# Patient Record
Sex: Female | Born: 1970 | Race: Black or African American | Hispanic: No | Marital: Married | Smoking: Former smoker
Health system: Southern US, Community
[De-identification: ages and names within clinical notes are randomized; demographics above are authoritative.]

## PROBLEM LIST (undated history)

## (undated) DIAGNOSIS — E785 Hyperlipidemia, unspecified: Secondary | ICD-10-CM

## (undated) DIAGNOSIS — I10 Essential (primary) hypertension: Secondary | ICD-10-CM

## (undated) DIAGNOSIS — I2699 Other pulmonary embolism without acute cor pulmonale: Secondary | ICD-10-CM

## (undated) DIAGNOSIS — E119 Type 2 diabetes mellitus without complications: Secondary | ICD-10-CM

## (undated) HISTORY — DX: Type 2 diabetes mellitus without complications: E11.9

## (undated) HISTORY — PX: OTHER SURGICAL HISTORY: SHX169

## (undated) HISTORY — DX: Hyperlipidemia, unspecified: E78.5

## (undated) HISTORY — DX: Other pulmonary embolism without acute cor pulmonale: I26.99

---

## 2001-02-14 ENCOUNTER — Ambulatory Visit (HOSPITAL_COMMUNITY): Admission: RE | Admit: 2001-02-14 | Discharge: 2001-02-14 | Payer: Self-pay | Admitting: *Deleted

## 2001-02-14 ENCOUNTER — Encounter: Payer: Self-pay | Admitting: *Deleted

## 2001-06-21 ENCOUNTER — Encounter: Payer: Self-pay | Admitting: *Deleted

## 2001-06-21 ENCOUNTER — Ambulatory Visit (HOSPITAL_COMMUNITY): Admission: RE | Admit: 2001-06-21 | Discharge: 2001-06-21 | Payer: Self-pay | Admitting: *Deleted

## 2001-07-01 ENCOUNTER — Ambulatory Visit (HOSPITAL_COMMUNITY): Admission: AD | Admit: 2001-07-01 | Discharge: 2001-07-01 | Payer: Self-pay | Admitting: *Deleted

## 2001-07-18 ENCOUNTER — Inpatient Hospital Stay (HOSPITAL_COMMUNITY): Admission: RE | Admit: 2001-07-18 | Discharge: 2001-07-21 | Payer: Self-pay | Admitting: *Deleted

## 2001-07-28 ENCOUNTER — Emergency Department (HOSPITAL_COMMUNITY): Admission: EM | Admit: 2001-07-28 | Discharge: 2001-07-28 | Payer: Self-pay | Admitting: Emergency Medicine

## 2001-07-28 ENCOUNTER — Encounter: Payer: Self-pay | Admitting: Emergency Medicine

## 2004-10-05 ENCOUNTER — Ambulatory Visit (HOSPITAL_COMMUNITY): Admission: RE | Admit: 2004-10-05 | Discharge: 2004-10-05 | Payer: Self-pay

## 2004-12-26 ENCOUNTER — Ambulatory Visit (HOSPITAL_COMMUNITY): Admission: RE | Admit: 2004-12-26 | Discharge: 2004-12-26 | Payer: Self-pay | Admitting: Obstetrics and Gynecology

## 2005-08-19 IMAGING — US US BREAST*R*
1 series · 4 of 4 positions shown · non-contrast
Comparison: none

BILATERAL  DIAGNOSTIC MAMMOGRAM AND RIGHT BREAST ULTRASOUND:
CLINICAL DATA: 33-year-old with nodule seen in the right axilla on recent MRI performed at [facility] 
for evaluation of the liver.  Review is performed of the MRI performed in June 2004, showing 
small lower axillary lymph nodes on the right.

[Series 1: unknown · 0.07mm/px · 4 of 4 slices shown]
[im 1/4]
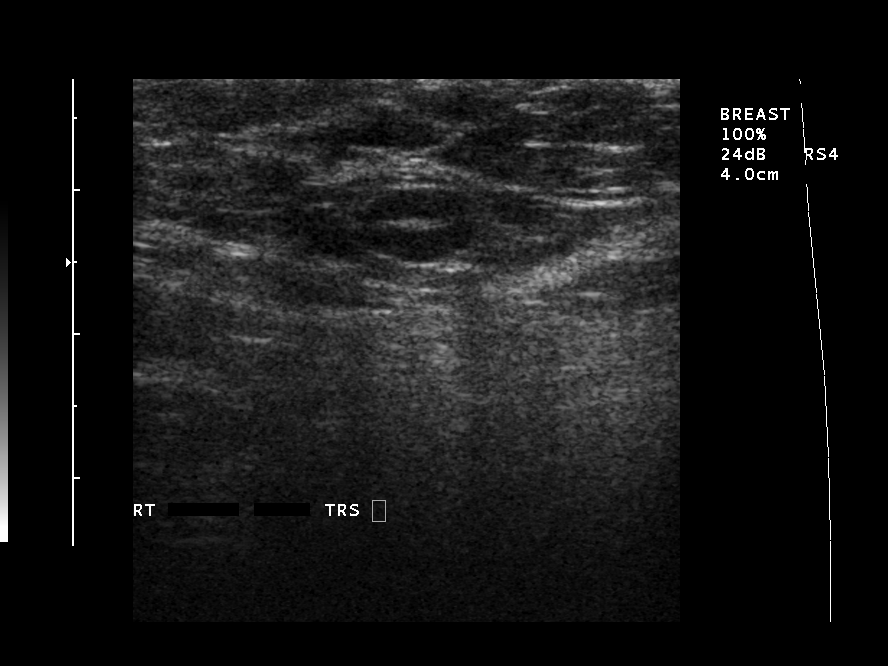
[im 2/4]
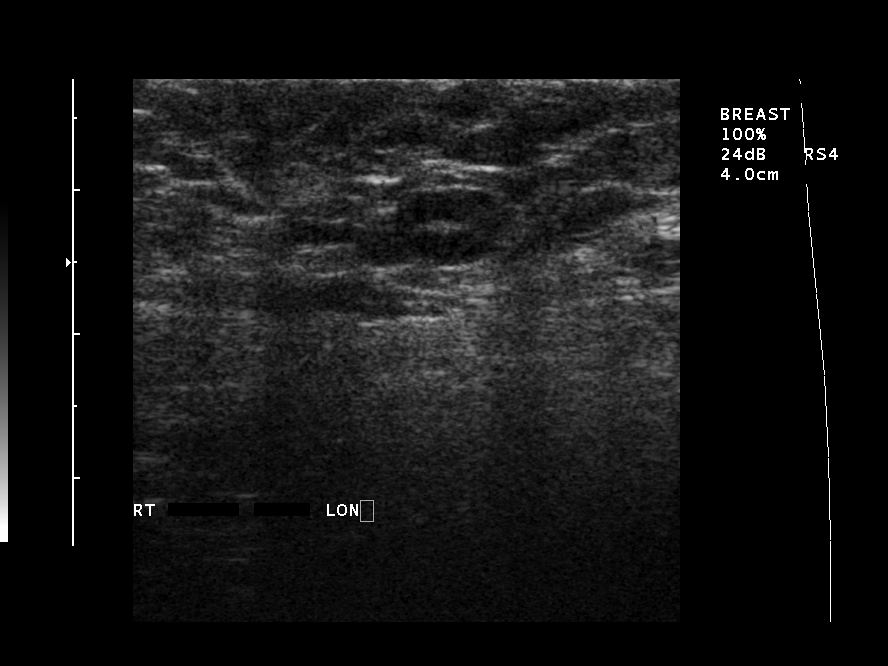
[im 3/4]
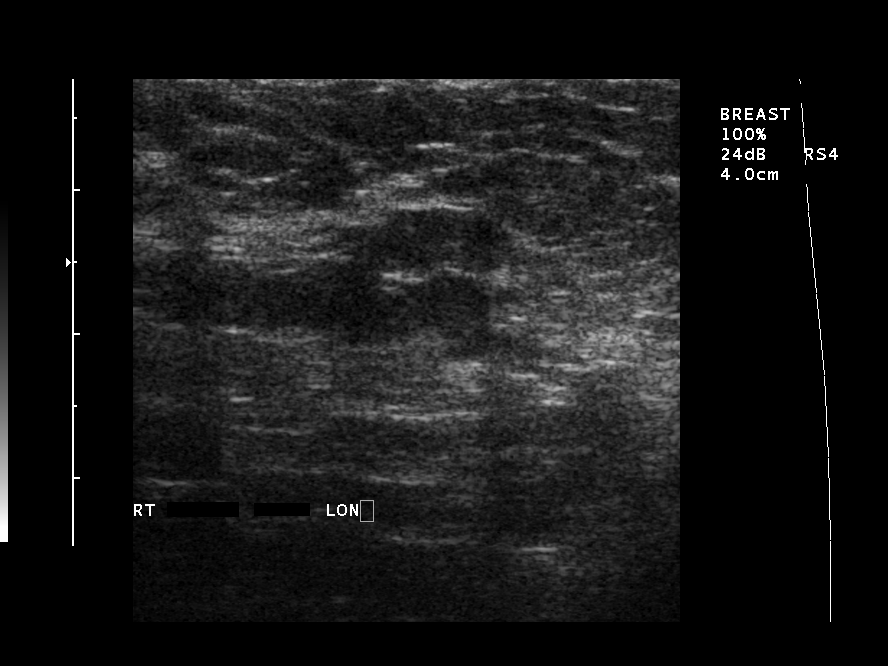
[im 4/4]
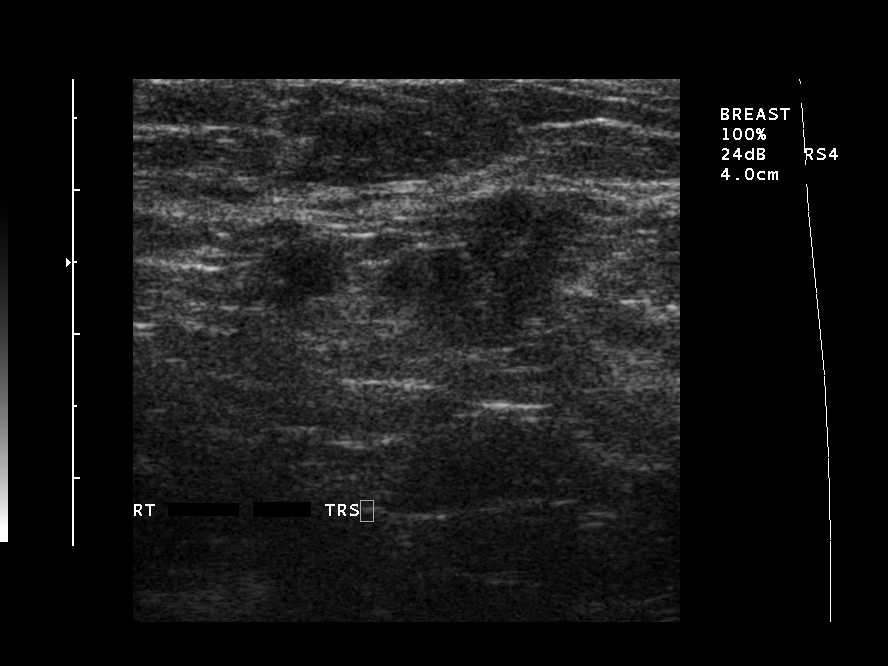

[4 of 4 positions shown; findings below may reference images not displayed]

Baseline mammogram shows fibroglandular parenchyma bilaterally.  No mass, architectural distortion 
or suspicious microcalcifications are seen to suggest the presence of malignancy.  Benign-appearing
lymph nodes are seen in the axillae bilaterally.

On physical exam, I am unable to palpate an abnormality in the lower portion of the right axilla.  
Ultrasound is performed of the entire right axilla, showing normal-appearing lymph nodes.  No 
enlarged nodes or other masses identified.
IMPRESSION: No evidence for malignancy.  Annual mammography is recommended beginning at age 40.

ASSESSMENT: Negative - BI-RADS 1

Routine screening mammogram at age 38.

## 2017-02-07 ENCOUNTER — Encounter (HOSPITAL_COMMUNITY): Payer: 59

## 2017-02-07 ENCOUNTER — Encounter (HOSPITAL_COMMUNITY): Payer: 59 | Attending: Oncology | Admitting: Oncology

## 2017-02-07 ENCOUNTER — Encounter (HOSPITAL_COMMUNITY): Payer: Self-pay

## 2017-02-07 DIAGNOSIS — E785 Hyperlipidemia, unspecified: Secondary | ICD-10-CM | POA: Diagnosis not present

## 2017-02-07 DIAGNOSIS — E119 Type 2 diabetes mellitus without complications: Secondary | ICD-10-CM | POA: Diagnosis not present

## 2017-02-07 DIAGNOSIS — Z87891 Personal history of nicotine dependence: Secondary | ICD-10-CM | POA: Insufficient documentation

## 2017-02-07 DIAGNOSIS — Z794 Long term (current) use of insulin: Secondary | ICD-10-CM | POA: Diagnosis not present

## 2017-02-07 DIAGNOSIS — D72829 Elevated white blood cell count, unspecified: Secondary | ICD-10-CM | POA: Insufficient documentation

## 2017-02-07 DIAGNOSIS — Z79899 Other long term (current) drug therapy: Secondary | ICD-10-CM | POA: Insufficient documentation

## 2017-02-07 LAB — COMPREHENSIVE METABOLIC PANEL
ALT: 16 U/L (ref 14–54)
AST: 19 U/L (ref 15–41)
Albumin: 4.3 g/dL (ref 3.5–5.0)
Alkaline Phosphatase: 100 U/L (ref 38–126)
Anion gap: 9 (ref 5–15)
BUN: 11 mg/dL (ref 6–20)
CHLORIDE: 105 mmol/L (ref 101–111)
CO2: 24 mmol/L (ref 22–32)
Calcium: 9.5 mg/dL (ref 8.9–10.3)
Creatinine, Ser: 0.6 mg/dL (ref 0.44–1.00)
Glucose, Bld: 156 mg/dL — ABNORMAL HIGH (ref 65–99)
POTASSIUM: 3.8 mmol/L (ref 3.5–5.1)
Sodium: 138 mmol/L (ref 135–145)
TOTAL PROTEIN: 7.9 g/dL (ref 6.5–8.1)
Total Bilirubin: 0.8 mg/dL (ref 0.3–1.2)

## 2017-02-07 LAB — CBC WITH DIFFERENTIAL/PLATELET
BASOS ABS: 0.1 10*3/uL (ref 0.0–0.1)
Basophils Relative: 0 %
EOS PCT: 2 %
Eosinophils Absolute: 0.3 10*3/uL (ref 0.0–0.7)
HCT: 45.3 % (ref 36.0–46.0)
Hemoglobin: 15.2 g/dL — ABNORMAL HIGH (ref 12.0–15.0)
LYMPHS ABS: 4.8 10*3/uL — AB (ref 0.7–4.0)
LYMPHS PCT: 27 %
MCH: 29.1 pg (ref 26.0–34.0)
MCHC: 33.6 g/dL (ref 30.0–36.0)
MCV: 86.8 fL (ref 78.0–100.0)
MONO ABS: 1.4 10*3/uL — AB (ref 0.1–1.0)
Monocytes Relative: 8 %
Neutro Abs: 11.2 10*3/uL — ABNORMAL HIGH (ref 1.7–7.7)
Neutrophils Relative %: 63 %
PLATELETS: 222 10*3/uL (ref 150–400)
RBC: 5.22 MIL/uL — ABNORMAL HIGH (ref 3.87–5.11)
RDW: 14.5 % (ref 11.5–15.5)
WBC: 17.8 10*3/uL — ABNORMAL HIGH (ref 4.0–10.5)

## 2017-02-07 NOTE — Progress Notes (Signed)
[facility] Initial Visit:  Patient Care Team: Renee Rival, NP as PCP - General (Nurse Practitioner)  CHIEF COMPLAINTS/PURPOSE OF CONSULTATION:  Leukocytosis  HISTORY OF PRESENTING ILLNESS: Lindsey Vazquez 46 y.o. female is here because of  persistent leukocytosis. Patient recently had a CBC performed on 12/01/16 which demonstrated WBC 14.6 K, hemoglobin 14.9 g/dL, hematocrit 45.5%, MCV 88, platelets 290K, differential demonstrated an absolute neutrophil count of 8.5K, absolute lymphocytes 4.7K, absolute monocytes 1K. The received from 11/13/16 was 13.8K. WBC from 08/18/16 was 13.4K. Patient denies any chronic infections, however she does state that she has bad sinus infections occasionally. She used to be a smoker however states that she quit cigarettes a year ago and currently smokes cigars maybe once a week. She has fatigue. Denies any chest pain, shortness of breath, abdominal pain.  Review of Systems  Constitutional: Positive for fatigue. Negative for appetite change, chills and fever.  HENT:   Negative for hearing loss, lump/mass, mouth sores, sore throat and tinnitus.   Eyes: Negative for eye problems and icterus.  Respiratory: Negative for chest tightness, cough, hemoptysis, shortness of breath and wheezing.   Cardiovascular: Negative for chest pain, leg swelling and palpitations.  Gastrointestinal: Negative for abdominal distention, abdominal pain, blood in stool, diarrhea, nausea and vomiting.  Endocrine: Negative.  Negative for hot flashes.  Genitourinary: Negative for difficulty urinating, frequency and hematuria.   Musculoskeletal: Negative for arthralgias and neck pain.  Skin: Negative for itching and rash.  Neurological: Negative for dizziness, headaches and speech difficulty.  Hematological: Negative for adenopathy. Does not bruise/bleed easily.  Psychiatric/Behavioral: Negative for confusion. The patient is not nervous/anxious.     MEDICAL  HISTORY: Past Medical History:  Diagnosis Date  . Diabetes mellitus without complication ([place])   . Hyperlipemia     SURGICAL HISTORY: Past Surgical History:  Procedure Laterality Date  . c-section      SOCIAL HISTORY: Social History   Social History  . Marital status: Single    Spouse name: N/A  . Number of children: N/A  . Years of education: N/A   Occupational History  . Not on file.   Social History Main Topics  . Smoking status: Not on file  . Smokeless tobacco: Not on file  . Alcohol use Not on file  . Drug use: Unknown  . Sexual activity: Not on file   Other Topics Concern  . Not on file   Social History Narrative  . No narrative on file    FAMILY HISTORY No family history on file.  ALLERGIES:  has No Known Allergies.  MEDICATIONS:  Current Outpatient Prescriptions  Medication Sig Dispense Refill  . atorvastatin (LIPITOR) 10 MG tablet Take 10 mg by mouth daily.    . cholecalciferol (VITAMIN D) 1000 units tablet Take 1,000 Units by mouth once a week.    Marland Kitchen ibuprofen (ADVIL,MOTRIN) 800 MG tablet Take 800 mg by mouth every 8 (eight) hours as needed.    . insulin aspart (NOVOLOG) 100 UNIT/ML injection Inject into the skin 3 (three) times daily before meals.    . insulin glargine (LANTUS) 100 UNIT/ML injection Inject 40 Units into the skin at bedtime.    . metFORMIN (GLUCOPHAGE) 500 MG tablet Take by mouth 2 (two) times daily with a meal.    . traZODone (DESYREL) 50 MG tablet Take 50 mg by mouth at bedtime.     No current facility-administered medications for this visit.     PHYSICAL EXAMINATION:  Vitals:   02/07/17 1305  BP: 130/78  Pulse: (!) 102  Resp: 16  Temp: 98.1 F (36.7 C)    Filed Weights   02/07/17 1305  Weight: 223 lb 4.8 oz (101.3 kg)     Physical Exam  Constitutional: She is oriented to person, place, and time and well-developed, well-nourished, and in no distress. No distress.  HENT:  Head: Normocephalic and atraumatic.   Mouth/Throat: No oropharyngeal exudate.  Eyes: Pupils are equal, round, and reactive to light. Conjunctivae are normal. No scleral icterus.  Neck: Normal range of motion. Neck supple. No JVD present.  Cardiovascular: Normal rate, regular rhythm and normal heart sounds.  Exam reveals no gallop and no friction rub.   No murmur heard. Pulmonary/Chest: Breath sounds normal. No respiratory distress. She has no wheezes. She has no rales.  Abdominal: Soft. Bowel sounds are normal. She exhibits no distension. There is no tenderness. There is no guarding.  Musculoskeletal: She exhibits no edema or tenderness.  Lymphadenopathy:    She has no cervical adenopathy.  Neurological: She is alert and oriented to person, place, and time. No cranial nerve deficit.  Skin: Skin is warm and dry. No rash noted. No erythema. No pallor.  Psychiatric: Affect and judgment normal.     LABORATORY DATA: I have personally reviewed the data as listed:  No visits with results within 1 Month(s) from this visit.  Latest known visit with results is:  No results found for any previous visit.    RADIOGRAPHIC STUDIES: I have personally reviewed the radiological images as listed and agree with the findings in the report  No results found.  ASSESSMENT: Persistent mild leukocytosis  PLAN: I will perform a leukocytosis workup with labs as stated below to rule out CML and myeloproliferative disorders.  RTC in 2 weeks to discuss labwork and the next plan of care.    Orders Placed This Encounter  Procedures  . CBC with Differential    Standing Status:   Future    Standing Expiration Date:   02/07/2018  . Comprehensive metabolic panel    Standing Status:   Future    Standing Expiration Date:   02/07/2018  . JAK2 V617F, w Reflex to CALR/E12/MPL    Standing Status:   Future    Standing Expiration Date:   02/07/2018  . BCR-ABL1, CML/ALL, PCR, QUANT    Standing Status:   Future    Standing Expiration Date:    02/07/2018    All questions were answered. The patient knows to call the clinic with any problems, questions or concerns.  This note was electronically signed.    Twana First, MD  02/07/2017 1:34 PM

## 2017-02-20 LAB — BCR-ABL1, CML/ALL, PCR, QUANT

## 2017-02-22 ENCOUNTER — Other Ambulatory Visit (HOSPITAL_COMMUNITY): Payer: Self-pay

## 2017-02-27 ENCOUNTER — Ambulatory Visit (HOSPITAL_COMMUNITY): Payer: Self-pay

## 2017-03-12 LAB — CALR + JAK2 E12-15 + MPL (REFLEXED)

## 2017-03-12 LAB — JAK2 V617F, W REFLEX TO CALR/E12/MPL

## 2017-03-14 ENCOUNTER — Encounter (HOSPITAL_COMMUNITY): Payer: 59 | Attending: Oncology | Admitting: Oncology

## 2017-03-14 ENCOUNTER — Encounter (HOSPITAL_COMMUNITY): Payer: Self-pay

## 2017-03-14 VITALS — BP 131/75 | HR 110 | Resp 16 | Wt 228.0 lb

## 2017-03-14 DIAGNOSIS — D72829 Elevated white blood cell count, unspecified: Secondary | ICD-10-CM

## 2017-03-14 DIAGNOSIS — Z79899 Other long term (current) drug therapy: Secondary | ICD-10-CM | POA: Insufficient documentation

## 2017-03-14 DIAGNOSIS — E119 Type 2 diabetes mellitus without complications: Secondary | ICD-10-CM | POA: Insufficient documentation

## 2017-03-14 DIAGNOSIS — E785 Hyperlipidemia, unspecified: Secondary | ICD-10-CM | POA: Insufficient documentation

## 2017-03-14 DIAGNOSIS — Z794 Long term (current) use of insulin: Secondary | ICD-10-CM | POA: Insufficient documentation

## 2017-03-14 DIAGNOSIS — Z87891 Personal history of nicotine dependence: Secondary | ICD-10-CM | POA: Insufficient documentation

## 2017-03-14 NOTE — Progress Notes (Signed)
[facility] Initial Visit:  Patient Care Team: Renee Rival, NP as PCP - General (Nurse Practitioner)  CHIEF COMPLAINTS/PURPOSE OF CONSULTATION:  Leukocytosis  HISTORY OF PRESENTING ILLNESS: Lindsey Vazquez 46 y.o. female is here because of  persistent leukocytosis. Patient recently had a CBC performed on 12/01/16 which demonstrated WBC 14.6 K, hemoglobin 14.9 g/dL, hematocrit 45.5%, MCV 88, platelets 290K, differential demonstrated an absolute neutrophil count of 8.5K, absolute lymphocytes 4.7K, absolute monocytes 1K. The received from 11/13/16 was 13.8K. WBC from 08/18/16 was 13.4K. Patient denies any chronic infections, however she does state that she has bad sinus infections occasionally. She used to be a smoker however states that she quit cigarettes a year ago and currently smokes cigars maybe once a week. She has fatigue. Denies any chest pain, shortness of breath, abdominal pain.  INTERVAL HISTORY: Patient presents today for review of her labs. 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 She denies any new complaints since the last visit. She continues to feel tired.  Review of Systems  Constitutional: Positive for fatigue. Negative for appetite change, chills and fever.  HENT:   Negative for hearing loss, lump/mass, mouth sores, sore throat and tinnitus.   Eyes: Negative for eye problems and icterus.  Respiratory: Negative for chest tightness, cough, hemoptysis, shortness of breath and wheezing.   Cardiovascular: Negative for chest pain, leg swelling and palpitations.  Gastrointestinal: Negative for abdominal distention, abdominal pain, blood in stool, diarrhea, nausea and vomiting.  Endocrine: Negative.  Negative for hot flashes.  Genitourinary: Negative for difficulty urinating, frequency and hematuria.   Musculoskeletal: Negative for arthralgias and neck pain.  Skin: Negative for itching and rash.   Neurological: Negative for dizziness, headaches and speech difficulty.  Hematological: Negative for adenopathy. Does not bruise/bleed easily.  Psychiatric/Behavioral: Negative for confusion. The patient is not nervous/anxious.     MEDICAL HISTORY: Past Medical History:  Diagnosis Date  . Diabetes mellitus without complication ([place])   . Hyperlipemia     SURGICAL HISTORY: Past Surgical History:  Procedure Laterality Date  . c-section      SOCIAL HISTORY: Social History   Social History  . Marital status: Single    Spouse name: N/A  . Number of children: N/A  . Years of education: N/A   Occupational History  . Not on file.   Social History Main Topics  . Smoking status: Former Smoker    Quit date: 07/25/2015  . Smokeless tobacco: Never Used  . Alcohol use No     Comment: socially  . Drug use: No  . Sexual activity: Yes   Other Topics Concern  . Not on file   Social History Narrative  . No narrative on file    FAMILY HISTORY History reviewed. No pertinent family history.  ALLERGIES:  has No Known Allergies.  MEDICATIONS:  Current Outpatient Prescriptions  Medication Sig Dispense Refill  . atorvastatin (LIPITOR) 10 MG tablet Take 10 mg by mouth daily.    . cholecalciferol (VITAMIN D) 1000 units tablet Take 1,000 Units by mouth once a week.    . Dapagliflozin Propanediol (FARXIGA PO) Take 1 tablet by mouth daily.    Marland Kitchen ibuprofen (ADVIL,MOTRIN) 800 MG tablet Take 800 mg by mouth every 8 (eight) hours as needed.    . insulin aspart (NOVOLOG) 100 UNIT/ML injection Inject into the skin 3 (three) times daily before meals.    . insulin glargine (LANTUS) 100 UNIT/ML injection  Inject 40 Units into the skin at bedtime.    . metFORMIN (GLUCOPHAGE) 500 MG tablet Take by mouth 2 (two) times daily with a meal.    . traZODone (DESYREL) 50 MG tablet Take 50 mg by mouth at bedtime.     No current facility-administered medications for this visit.     PHYSICAL  EXAMINATION:   Vitals:   03/14/17 1502  BP: 131/75  Pulse: (!) 110  Resp: 16  SpO2: 99%    Filed Weights   03/14/17 1502  Weight: 228 lb (103.4 kg)     Physical Exam  Constitutional: She is oriented to person, place, and time and well-developed, well-nourished, and in no distress. No distress.  HENT:  Head: Normocephalic and atraumatic.  Mouth/Throat: No oropharyngeal exudate.  Eyes: Pupils are equal, round, and reactive to light. Conjunctivae are normal. No scleral icterus.  Neck: Normal range of motion. Neck supple. No JVD present.  Cardiovascular: Normal rate, regular rhythm and normal heart sounds.  Exam reveals no gallop and no friction rub.   No murmur heard. Pulmonary/Chest: Breath sounds normal. No respiratory distress. She has no wheezes. She has no rales.  Abdominal: Soft. Bowel sounds are normal. She exhibits no distension. There is no tenderness. There is no guarding.  Musculoskeletal: She exhibits no edema or tenderness.  Lymphadenopathy:    She has no cervical adenopathy.  Neurological: She is alert and oriented to person, place, and time. No cranial nerve deficit.  Skin: Skin is warm and dry. No rash noted. No erythema. No pallor.  Psychiatric: Affect and judgment normal.     LABORATORY DATA: I have personally reviewed the data as listed:  No visits with results within 1 Month(s) from this visit.  Latest known visit with results is:  Lab on 02/07/2017  Component Date Value Ref Range Status  . WBC 02/07/2017 17.8* 4.0 - 10.5 K/uL Final  . RBC 02/07/2017 5.22* 3.87 - 5.11 MIL/uL Final  . Hemoglobin 02/07/2017 15.2* 12.0 - 15.0 g/dL Final  . HCT 02/07/2017 45.3  36.0 - 46.0 % Final  . MCV 02/07/2017 86.8  78.0 - 100.0 fL Final  . MCH 02/07/2017 29.1  26.0 - 34.0 pg Final  . MCHC 02/07/2017 33.6  30.0 - 36.0 g/dL Final  . RDW 02/07/2017 14.5  11.5 - 15.5 % Final  . Platelets 02/07/2017 222  150 - 400 K/uL Final  . Neutrophils Relative % 02/07/2017 63  %  Final  . Neutro Abs 02/07/2017 11.2* 1.7 - 7.7 K/uL Final  . Lymphocytes Relative 02/07/2017 27  % Final  . Lymphs Abs 02/07/2017 4.8* 0.7 - 4.0 K/uL Final  . Monocytes Relative 02/07/2017 8  % Final  . Monocytes Absolute 02/07/2017 1.4* 0.1 - 1.0 K/uL Final  . Eosinophils Relative 02/07/2017 2  % Final  . Eosinophils Absolute 02/07/2017 0.3  0.0 - 0.7 K/uL Final  . Basophils Relative 02/07/2017 0  % Final  . Basophils Absolute 02/07/2017 0.1  0.0 - 0.1 K/uL Final  . Sodium 02/07/2017 138  135 - 145 mmol/L Final  . Potassium 02/07/2017 3.8  3.5 - 5.1 mmol/L Final  . Chloride 02/07/2017 105  101 - 111 mmol/L Final  . CO2 02/07/2017 24  22 - 32 mmol/L Final  . Glucose, Bld 02/07/2017 156* 65 - 99 mg/dL Final  . BUN 02/07/2017 11  6 - 20 mg/dL Final  . Creatinine, Ser 02/07/2017 0.60  0.44 - 1.00 mg/dL Final  . Calcium 02/07/2017 9.5  8.9 -  10.3 mg/dL Final  . Total Protein 02/07/2017 7.9  6.5 - 8.1 g/dL Final  . Albumin 02/07/2017 4.3  3.5 - 5.0 g/dL Final  . AST 02/07/2017 19  15 - 41 U/L Final  . ALT 02/07/2017 16  14 - 54 U/L Final  . Alkaline Phosphatase 02/07/2017 100  38 - 126 U/L Final  . Total Bilirubin 02/07/2017 0.8  0.3 - 1.2 mg/dL Final  . GFR calc non Af Amer 02/07/2017 >60  >60 mL/min Final  . GFR calc Af Amer 02/07/2017 >60  >60 mL/min Final   Comment: (NOTE) The eGFR has been calculated using the CKD EPI equation. This calculation has not been validated in all clinical situations. eGFR's persistently <60 mL/min signify possible Chronic Kidney Disease.   . Anion gap 02/07/2017 9  5 - 15 Final  . JAK2 GenotypR 02/07/2017 Comment   Final   Comment: (NOTE) Result: NEGATIVE for the JAK2 V617F mutation. Interpretation:  The G to T nucleotide change encoding the V617F mutation was not detected.  This result does not rule out the presence of the JAK2 mutation at a level below the sensitivity of detection of this assay, or the presence of other mutations within JAK2 not  detected by this assay.  This result does not rule out a diagnosis of polycythemia vera, essential thrombocythemia or idiopathic myelofibrosis as the V617F mutation is not detected in all patients with these disorders.   Marland Kitchen BACKGROUND: 02/07/2017 Comment   Final   Comment: (NOTE) JAK2 is a cytoplasmic tyrosine kinase with a key role in signal transduction from multiple hematopoietic growth factor receptors. A point mutation within exon 14 of the JAK2 gene ([id number]) encoding a valine to phenylalanine substitution at position 617 of the JAK2 protein (V617F) has been identified in most patients with polycythemia vera, and in about half of those with either essential thrombocythemia or idiopathic myelofibrosis. The V617F has also been detected, although infrequently, in other myeloid disorders such as chronic myelomonocytic leukemia and chronic neutrophilic luekemia. V617F is an acquired mutation that alters a highly conserved valine present in the negative regulatory JH2 domain of the JAK2 protein and is predicted to dysregulate kinase activity. Methodology: Total genomic DNA was extracted and subjected to TaqMan real-time PCR amplification/detection. Two amplification products per sample were monitored by real-time PCR using primers/probes s                          pecific to JAK2 wild type (WT) and JAK2 mutant V617F. The ABI7900 Absolute Quantitation software will compare the patient specimen valuse to the standard curves and generate percent values for wild type and mutant type. In vitro studies have indicated that this assay has an analytical sensitivity of 1%. References: Baxter EJ, Scott Phineas Real, et al. Acquired mutation of the tyrosine kinase JAK2 in human myeloproliferative disorders. Lancet. 2005 Mar 19-25; 365(9464):1054-1061. Alfonso Ramus Couedic JP. A unique clonal JAK2 mutation leading to constitutive signaling causes polycythaemia vera. Nature. 2005 Apr 28;  434(7037):1144-1148. Kralovics R, Passamonti F, Buser AS, et al. A gain-of-function mutation of JAK2 in myeloproliferative disorders. N Engl J Med. 2005 Apr 28; 352(17):1779-1790.   . Director Review, JAK2 02/07/2017 Comment   Final   Comment: (NOTE) Katina Degree, MD, PhD Director, [facility] [place], [place] [address] [phone]   . REFLEX: 02/07/2017 Comment   Final   Comment: (NOTE) Reflex to CALR Mutation  Analysis, JAK2 Exon 12-15 Mutation Analysis, and MPL Mutation Analysis is indicated.   Marland Kitchen Extraction 02/07/2017 Completed   Corrected   Comment: (NOTE) Performed At: [facility] [address] [place], [place] [id number] Nechama Guard MD CH:[id number] Performed At: [facility] [address] [place], [place] [id number] Nechama Guard MD QM:[id number]   . b2a2 transcript 02/07/2017 Comment  % Final   Comment: (NOTE)           <0.0032 % (sensitivity limit of assay)   . b3a2 transcript 02/07/2017 Comment  % Final   Comment: (NOTE)           <0.0032 % (sensitivity limit of assay)   . E1A2 Transcript 02/07/2017 Comment  % Final   Comment: (NOTE)           <0.0032 % (sensitivity limit of assay)   . Interpretation (BCRAL): 02/07/2017 Comment   Final   Comment: (NOTE) NEGATIVE for the BCR-ABL1 e1a2 (p190), e13a2 (b2a2, p210) and e14a2 (b3a2, p210) fusion transcripts. These results do not rule out the presence of rare BCR-ABL1 transcripts not detected by this assay.   . Director Review (BCRAL): 02/07/2017 Comment   Final   Comment: (NOTE) Katina Degree, MD, PhD Director, [facility] [place], [place] [address] [phone]   . Background: 02/07/2017 Comment   Corrected   Comment: (NOTE) This assay can detect three different types of BCR-ABL1 fusion transcripts associated with CML, ALL, and AML: e13a2  (previously b2a2) and e14a2 (previously b3a2) (major breakpoint, p210), as well as e1a2 (minor breakpoint, p190). The e13a2 and e14a2 transcript values are titrated to the current International Scale (IS). The standardized baseline is 100% BCR-ABL1 (IS) and major molecular response (MMR) is equivalent to 0.1% BCR-ABL1 (IS) corresponding to a 3-log reduction. Results should be correlated with appropriate clinical and laboratory information as indicated.   . Methodology 02/07/2017 Comment   Corrected   Comment: (NOTE) Total RNA is isolated from the sample and subject to a real-time, reverse transcriptase polymerase chain reaction (RT-PCR). The PCR primers and probes are specific for BCR-ABL1 e13a2, e14a2 and e1a2 fusion transcripts. The ABL1 transcript is amplified as the control for cDNA quantity and quality. Serial dilutions of a validated positive control RNA with known t(9;22) BCR-ABL1 are used as reference for quantification of BCR-ABL1 relative to ABL1. The numeric BCR-ABL1 level is reportd as % BCR-ABL1/ABL1 and the detection sensitivity is 4.5 log below the standard baseline. This test was developed and its performance characteristics determined by [facility]. It has not been cleared or approved by the Food and Drug Administration. References:    1. Anastasia Fiedler and Branford S: Seminars in Hematology 2003;       40 (suppl2):62-68.    2. White HE, et al. Blood 2010; 116: e111-117.    3. NCCN Clinical Practice Guidelines in Oncology, Chronic       Myeloid Leukemia. V2. 2017.                           Performed At: [facility] [address] [place], [place] [id number] Nechama Guard MD HA:[id number] Performed At: [facility] [place], [place] [id number] Nechama Guard MD QA:[id number]   . CALR Mutation Detection Result 02/07/2017 Comment   Final   Comment: (NOTE) POSITIVE A deletion mutation was detected within the analyzed region of the  calreticulin (CALR) gene.  A positive result indicates the presence of a clonal population carrying CALR gene mutations. Results should be interpreted in conjunction with clinical and laboratory findings for the most accurate interpretation.   . Background: 02/07/2017 Comment   Final   Comment: (NOTE) The calcium-binding endoplasmic reticulin chaperone protein, calreticulin (CALR), is somatically mutated in approximately 70% of patients with JAK2-negative essential thrombocythemia (ET) and 60- 88% of patients with JAK2-negative primary myelofibrosis(PMF). Only a minority of patients (approximately 8%) with myelodysplasia have mutations in  CALR gene. CALR mutations are rarely detected in patients with de novo acute myeloid leukemia, chronic myelogenous leukemia, lymphoid leukemia, or solid tumors. CALR mutations are not detected in polycythemia and generally appear to be mutually exclusive with JAK2 mutations and MPL mutations. The majority of mutational changes involve a variety of insertion or deletion mutations in exon 9 of the calreticulin gene: approximately 53% of all CALR mutations are a 52 bp deletion (type-1) while the second most prevalent mutation (approximately 32%) contains a 5 bp insertion (type-2). Other mutations (non-type 1 or type 2) are seen                           in a small minority of cases. CALR mutations in PMF tend to be associated with a favorable prognosis compared to JAK2 V617F mutations, whereas primary myelofibrosis negative for CALR, JAK2 V617F and MPL mutations (so-called triple negative) is associated with a poor prognosis and shorter survival. The detection of a CALR gene mutation aids in the specific diagnosis of a myeloproliferative neoplasm, and help distinguish this clonal disease from a benign reactive process.   . Methodology: 02/07/2017 Comment   Final   Comment: (NOTE) Genomic DNA was isolated from the provided specimen. Polymerase  chain reaction (PCR) of exon 9 of the CALR gene was performed with specific fluorescent-labeled primers, and the PCR product was analyzed by capillary gel electrophoresis to determine the size of the PCR products. This PCR assay is capable of detecting a mutant cell population with a sensitivity of 5 mutant cells per 100 normal cells. A negative result does not exclude the presence of a myeloproliferative disorder or other neoplastic process. This test was developed and its performance characteristics determined by [facility]. It has not been cleared or approved by the Food and Drug Administration. The FDA has determined that such clearance or approval is not necessary.   . References: 02/07/2017 Comment   Final   Comment: (NOTE) 1. Klampfel, T. et al. (2013) Somatic mutations of calreticulin in   myeloproliferative neoplasms. [place] Engl. J. Med. 742:5956-3875. 2. Haynes Kerns et al. (2013) Somatic CALR mutations in   myeloproliferative neoplasms with nonmutated JAK2. [place] Engl. J.   Med. [phone].   Marland Kitchen Director Review 02/07/2017 Comment   Final   Comment: (NOTE) Katina Degree, MD, PhD Director, [facility] [place], [place] [address] [phone]   . JAK2 Exons 12-15 Mut Det PCR: 02/07/2017 Comment   Final   Comment: (NOTE) NEGATIVE JAK2 mutations were not detected in exons 12, 13, 14 and 15. This result does not rule out the presence of JAK2 mutation at a level below the detection sensitivity of this assay, the presence of other mutations outside the analyzed region of the JAK2 gene, or the presence of a myeloproliferative or other neoplasm. Result must be correlated with other clinical data for the most accurate diagnosis.   . Indications 02/07/2017 Comment:  Final   NO INDICATION SPECIFIED  . Specimen Type 02/07/2017 Comment   Final   No specimen type provided.  Marland Kitchen BACKGROUND: 02/07/2017 Comment   Final    Comment: (NOTE) JAK2 V617F mutation is detected in patients with polycythemia vera (95%), essential thrombocythemia (50%) and primary myelofibrosis (50%). A small percentage of JAK2 mutation positive patients (3.3%) contain other non-V617F mutations within exons 12 to 15. The detection of a JAK2 gene mutation aids in the specific diagnosis of a myeloproliferative neoplasm, and help distinguish this clonal disease from a benign reactive process.   . Method 02/07/2017 Comment   Final   Comment: (NOTE) Total RNA was purified from the provided specimen. The JAK2 gene region covering exons 12 to 15 was subjected to reverse- transcription coupled PCR amplification, and bi-directional sequencing to identify sequence variations. This assay has a sensitivity to detect approximately 15% population of cells containing the JAK2 mutations in a background of non-mutant cells. This test was developed and its performance characteristics determined by [facility]. It has not been cleared or approved by the Food and Drug Administration.   . References 02/07/2017 Comment   Final   Comment: (NOTE) Algasham, N. et al. Detection of mutations in JAK2 exons 12-15 by Sanger sequencing. Int J Lab Hemato. 2015, 38:34-41. Joelene Millin al. Mutation profile of JAK2 transcripts in patients with chronic myeloproliferative neoplasias. J Mol Diagn. 2009, 11:49-53.   Marland Kitchen DIRECTOR REVIEW: 02/07/2017 Comment   Final   Comment: (NOTE) Loni Muse, PhD  Director, [facility]  Research [place], [place] [address]  [phone]   . MPL MUTATION ANALYSIS RESULT: 02/07/2017 Comment   Final   Comment: (NOTE) No MPL mutation was identified in the provided specimen of this individual. Results should be interpreted in conjunction with clinical and other laboratory findings for the most accurate interpretation.   Marland Kitchen BACKGROUND: 02/07/2017 Comment   Final   Comment: (NOTE) MPL  (myeloproliferative leukemia virus oncogene homology) belongs to the hematopoietin superfamily and enables its ligand thrombopoietin to facilitate both global hematopoiesis and megakaryocyte growth and differentiation. MPL W515 mutations are present in patients with primary myelofibrosis (PMF) and essential thrombocythemia (ET) at a frequency of approximately 5% and 1% respectively. The S505 mutation is detected in patients with hereditary thrombocythemia.   Marland Kitchen METHODOLOGY: 02/07/2017 Comment   Final   Comment: (NOTE) Genomic DNA was purified from the provided specimen. MPL gene region covering the S505N and W515L/K mutations were subjected to PCR amplification and bi-directional sequencing in duplicate to identify sequence variations. This assay has a sensitivity to detect approximately 20-25% population of cells containing the MPL mutations in a background of non-mutant cells. This assay will not detect the mutation below the sensitivity of this assay. Molecular- based testing is highly accurate, but as in any laboratory test, rare diagnostic errors may occur.   Marland Kitchen REFERENCES: 02/07/2017 Comment   Final   Comment: (NOTE) 1. Pardanani AD, et al. (2006). MPL515 mutations in   myeloproliferative and other myeloid disorders: a study   of 1182 patients. Blood 353:6144-3154. 2. Andre Lefort and Levine RL. (2008). JAK2 and MPL   mutations in myeloproliferative neoplasms: discovery and   science. Leukemia 22:1813-1817. 3. Juline Patch, et al. (2009). Evidence for a founder effect   of the MPL-S505N mutation in eight [place] pedigrees with   hereditary thrombocythemia. Haematologica 94(10):1368-   0086.   Marland Kitchen DIRECTOR REVIEW: 02/07/2017 Comment   Final   Comment: (NOTE) Linna Hoff  Mina Marble, PhD  Director, [facility] [place] [facility] [place], [place] [address]  [phone] This test was developed and its performance characteristics determined by  [facility]. It has not been cleared or approved by the Food and Drug Administration. Performed At: [facility] [address] [place], [place] [id number] Nechama Guard MD MP:[id number] Performed At: [facility] [place], [place] [id number] Nechama Guard MD KD:[id number]     RADIOGRAPHIC STUDIES: I have personally reviewed the radiological images as listed and agree with the findings in the report  No results found.  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   PLAN: -I have reviewed her labwork in detail with her today. She has a positive CALR mutation. The calcium-binding endoplasmic reticulin chaperone protein, calreticulin (CALR), is somatically mutated in approximately 70% of patients with JAK2-negative essential thrombocythemia (ET) and 60- 88% of patients with JAK2-negative primary myelofibrosis(PMF). Only a minority of patients (approximately 8%) with myelodysplasia have mutations in CALR gene. 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 -Will continue observation of her counts for now. If her leukocytosis should worsen dramatically, may consider cytoreductive agent with hydrea. Patient is currently asymptomatic. -RTC in 4 months with repeat CBC.   All questions were answered. The patient knows to call the clinic with any problems, questions or concerns.  This note was electronically signed.    Twana First, MD  03/14/2017 3:14 PM

## 2017-03-14 NOTE — Patient Instructions (Signed)
[facility] at [facility] Discharge Instructions  RECOMMENDATIONS MADE BY THE CONSULTANT AND ANY TEST RESULTS WILL BE SENT TO YOUR REFERRING PHYSICIAN.  You were seen today by Dr. Louise Zhou    Thank you for choosing [facility] at [facility] to provide your oncology and hematology care.  To afford each patient quality time with our provider, please arrive at least 15 minutes before your scheduled appointment time.    If you have a lab appointment with the Cancer Center please come in thru the  Main Entrance and check in at the main information desk  You need to re-schedule your appointment should you arrive 10 or more minutes late.  We strive to give you quality time with our providers, and arriving late affects you and other patients whose appointments are after yours.  Also, if you no show three or more times for appointments you may be dismissed from the clinic at the providers discretion.     Again, thank you for choosing [facility].  Our hope is that these requests will decrease the amount of time that you wait before being seen by our physicians.       _____________________________________________________________  Should you have questions after your visit to  Cancer Center, please contact our office at [phone] between the hours of 8:30 a.m. and 4:30 p.m.  Voicemails left after 4:30 p.m. will not be returned until the following business day.  For prescription refill requests, have your pharmacy contact our office.       Resources For Cancer Patients and their Caregivers ? American Cancer Society: Can assist with transportation, wigs, general needs, runs Look Good Feel Better.        1-888-227-6333 ? Cancer Care: Provides financial assistance, online support groups, medication/co-pay assistance.  1-800-813-HOPE (4673) ? [facility] Assists [place] cancer patients and their  families through emotional , educational and financial support.  336-427-4357 ? [place] DSS Where to apply for food stamps, Medicaid and utility assistance. 336-342-1394 ? RCATS: Transportation to medical appointments. 336-347-2287 ? Social Security Administration: May apply for disability if have a Stage IV cancer. 336-342-7796 1-800-772-1213 ? [place] Aging, Disability and Transit Services: Assists with nutrition, care and transit needs. 336-349-2343  Cancer Center Support Programs: [email or web address]@ > Cancer Support Group  2nd Tuesday of the month 1pm-2pm, Journey Room  > Creative Journey  3rd Tuesday of the month 1130am-1pm, Journey Room  > Look Good Feel Better  1st Wednesday of the month 10am-12 noon, Journey Room (Call American Cancer Society to register 1-800-395-5775)    

## 2017-07-10 ENCOUNTER — Other Ambulatory Visit (HOSPITAL_COMMUNITY): Payer: 59

## 2017-07-10 ENCOUNTER — Ambulatory Visit (HOSPITAL_COMMUNITY): Payer: 59

## 2017-09-19 ENCOUNTER — Other Ambulatory Visit (HOSPITAL_COMMUNITY): Payer: Self-pay | Admitting: *Deleted

## 2017-09-19 DIAGNOSIS — D72829 Elevated white blood cell count, unspecified: Secondary | ICD-10-CM

## 2017-09-20 ENCOUNTER — Other Ambulatory Visit: Payer: Self-pay

## 2017-09-20 ENCOUNTER — Encounter (HOSPITAL_COMMUNITY): Payer: Self-pay | Admitting: Adult Health

## 2017-09-20 ENCOUNTER — Inpatient Hospital Stay (HOSPITAL_BASED_OUTPATIENT_CLINIC_OR_DEPARTMENT_OTHER): Payer: 59 | Admitting: Adult Health

## 2017-09-20 ENCOUNTER — Inpatient Hospital Stay (HOSPITAL_COMMUNITY): Payer: 59 | Attending: Oncology

## 2017-09-20 VITALS — BP 115/70 | HR 86 | Temp 98.2°F | Resp 16 | Ht 62.0 in | Wt 219.2 lb

## 2017-09-20 DIAGNOSIS — D471 Chronic myeloproliferative disease: Secondary | ICD-10-CM | POA: Diagnosis present

## 2017-09-20 DIAGNOSIS — D72829 Elevated white blood cell count, unspecified: Secondary | ICD-10-CM

## 2017-09-20 LAB — CBC WITH DIFFERENTIAL/PLATELET
BASOS ABS: 0.1 10*3/uL (ref 0.0–0.1)
Basophils Relative: 0 %
Eosinophils Absolute: 0.3 10*3/uL (ref 0.0–0.7)
Eosinophils Relative: 2 %
HEMATOCRIT: 44.7 % (ref 36.0–46.0)
HEMOGLOBIN: 14.6 g/dL (ref 12.0–15.0)
LYMPHS PCT: 21 %
Lymphs Abs: 3 10*3/uL (ref 0.7–4.0)
MCH: 29.3 pg (ref 26.0–34.0)
MCHC: 32.7 g/dL (ref 30.0–36.0)
MCV: 89.6 fL (ref 78.0–100.0)
Monocytes Absolute: 1.3 10*3/uL — ABNORMAL HIGH (ref 0.1–1.0)
Monocytes Relative: 9 %
NEUTROS ABS: 10 10*3/uL — AB (ref 1.7–7.7)
NEUTROS PCT: 68 %
Platelets: 317 10*3/uL (ref 150–400)
RBC: 4.99 MIL/uL (ref 3.87–5.11)
RDW: 16 % — ABNORMAL HIGH (ref 11.5–15.5)
WBC: 14.6 10*3/uL — ABNORMAL HIGH (ref 4.0–10.5)

## 2017-09-20 NOTE — Progress Notes (Signed)
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 Laverle Patter note on 03/14/17)      INTERVAL HISTORY:  Lindsey Vazquez 47 y.o. female returns for follow-up for leukocytosis.   Here today unaccompanied.  Overall she tells me she has been feeling pretty well.  Appetite 100%; energy level 75%.  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  She is struggling with her diabetes management; was recently started on Jardiance, and has had 3 vaginal yeast infections since that time.  Her diabetes is managed by her PCP. 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   From a hematologic standpoint, she feels well.  Denies any fever or chills.  Denies severe fatigue, fevers, pruritis, or bone pain.Does endorse occasional drenching night sweats, but these have improved in recent months.  She was having night sweats 1-2 times per week, but now they are not as often. She thinks she may be going through menopause and having hot flashes. She missed several months of her menstrual cycle, but it recently started again. She tells me she currently has her menstrual period.     Denies any unintentional weight loss that she is aware of. Chart reviewed; weight down ~9 lbs from 02/2017, but only ~4 lbs from 01/2017.  She tells me that she is not necessarily actively trying to lose weight, but she stays very busy and attributes some of her weight loss to this.    REVIEW OF SYSTEMS:  Review of Systems  Constitutional: Positive for diaphoresis and fatigue. Negative for chills and fever.  HENT:  Negative.   Eyes:  Negative.   Respiratory: Negative.   Cardiovascular: Negative.   Gastrointestinal: Negative.   Endocrine: Positive for hot flashes.  Musculoskeletal: Negative.   Skin: Negative.  Negative for itching and rash.  Neurological: Positive for numbness.  Hematological: Negative.   Psychiatric/Behavioral: Negative.      PAST MEDICAL/SURGICAL HISTORY:  Past Medical History:  Diagnosis Date  . Diabetes mellitus without complication ([place])   . Hyperlipemia    Past Surgical History:  Procedure Laterality Date  . c-section       SOCIAL HISTORY:  Social History   Socioeconomic History  . Marital status: Single    Spouse name: Not on file  . Number of children: Not on file  . Years of education: Not on file  . Highest education level: Not on file  Social Needs  . Financial resource strain: Not on file  . Food insecurity - worry: Not on file  . Food insecurity - inability: Not on file  . Transportation needs - medical: Not on file  . Transportation needs - non-medical: Not on file  Occupational History  . Not on file  Tobacco Use  . Smoking status: Former Smoker    Last attempt to quit: 07/25/2015    Years since quitting: 2.1  . Smokeless tobacco: Never Used  Substance and Sexual Activity  . Alcohol use: No    Comment: socially  . Drug use: No  . Sexual activity: Yes  Other Topics Concern  . Not on file  Social History Narrative  . Not on file  FAMILY HISTORY:  History reviewed. No pertinent family history.  CURRENT MEDICATIONS:  Outpatient Encounter Medications as of 09/20/2017  Medication Sig  . atorvastatin (LIPITOR) 10 MG tablet Take 10 mg by mouth daily.  . cholecalciferol (VITAMIN D) 1000 units tablet Take 1,000 Units by mouth once a week.  . Dapagliflozin Propanediol (FARXIGA PO) Take 1 tablet by mouth daily.  . empagliflozin (JARDIANCE) 25 MG TABS tablet Take 25 mg by mouth daily.  Marland Kitchen ibuprofen (ADVIL,MOTRIN) 800 MG tablet Take 800 mg by mouth every 8 (eight)  hours as needed.  . insulin aspart (NOVOLOG) 100 UNIT/ML injection Inject into the skin 3 (three) times daily before meals.  . insulin glargine (LANTUS) 100 UNIT/ML injection Inject 40 Units into the skin at bedtime.  . metFORMIN (GLUCOPHAGE) 500 MG tablet Take by mouth 2 (two) times daily with a meal.  . Multiple Vitamin (MULTIVITAMIN) tablet Take 1 tablet by mouth daily.  . traZODone (DESYREL) 50 MG tablet Take 50 mg by mouth at bedtime.   No facility-administered encounter medications on file as of 09/20/2017.     ALLERGIES:  No Known Allergies   PHYSICAL EXAM:  ECOG Performance status: 0-1 - Mildly symptomatic; remains independent   Vitals:   09/20/17 0919  BP: 115/70  Pulse: 86  Resp: 16  Temp: 98.2 F (36.8 C)  SpO2: 98%   Filed Weights   09/20/17 0919  Weight: 219 lb 3.2 oz (99.4 kg)    Physical Exam  Constitutional: She is oriented to person, place, and time and well-developed, well-nourished, and in no distress.  HENT:  Head: Normocephalic.  Mouth/Throat: Oropharynx is clear and moist. No oropharyngeal exudate.  Eyes: Conjunctivae are normal. Pupils are equal, round, and reactive to light. No scleral icterus.  Neck: Normal range of motion. Neck supple.  Cardiovascular: Normal rate and regular rhythm.  Pulmonary/Chest: Effort normal and breath sounds normal. No respiratory distress.  Abdominal: Soft. Bowel sounds are normal. There is no tenderness.  Musculoskeletal: Normal range of motion. She exhibits no edema.  Lymphadenopathy:    She has no cervical adenopathy.    She has no axillary adenopathy.       Right: No supraclavicular adenopathy present.       Left: No supraclavicular adenopathy present.  Neurological: She is alert and oriented to person, place, and time. No cranial nerve deficit. Gait normal.  Skin: Skin is warm and dry. No rash noted.  Psychiatric: Mood, memory, affect and judgment normal.  Nursing note and vitals reviewed.    LABORATORY  DATA:  I have reviewed the labs as listed.  CBC    Component Value Date/Time   WBC 14.6 (H) 09/20/2017 0756   RBC 4.99 09/20/2017 0756   HGB 14.6 09/20/2017 0756   HCT 44.7 09/20/2017 0756   PLT 317 09/20/2017 0756   MCV 89.6 09/20/2017 0756   MCH 29.3 09/20/2017 0756   MCHC 32.7 09/20/2017 0756   RDW 16.0 (H) 09/20/2017 0756   LYMPHSABS 3.0 09/20/2017 0756   MONOABS 1.3 (H) 09/20/2017 0756   EOSABS 0.3 09/20/2017 0756   BASOSABS 0.1 09/20/2017 0756   CMP Latest Ref Rng & Units 02/07/2017  Glucose 65 - 99 mg/dL 156(H)  BUN 6 - 20 mg/dL 11  Creatinine 0.44 - 1.00 mg/dL 0.60  Sodium 135 - 145 mmol/L 138  Potassium 3.5 - 5.1 mmol/L 3.8  Chloride 101 - 111 mmol/L 105  CO2 22 - 32 mmol/L 24  Calcium 8.9 - 10.3 mg/dL 9.5  Total  Protein 6.5 - 8.1 g/dL 7.9  Total Bilirubin 0.3 - 1.2 mg/dL 0.8  Alkaline Phos 38 - 126 U/L 100  AST 15 - 41 U/L 19  ALT 14 - 54 U/L 16          PENDING LABS:    DIAGNOSTIC IMAGING:    PATHOLOGY:       ASSESSMENT & PLAN:   CALR-positive leukocytosis/primary myelofibrosis (PMF):  -Leukocytosis thought to be secondary to CALR+ mutation, with suspicion of primary myelofibrosis.  JAK2 (exons 12-15 & V617E), BCR-ABL, and MPL negative.   -We spent some time today talking about the nature of myelofibrosis, as well as possible treatments should the disease progress.  -Available lab results from today reviewed with patient in detail.  WBCs improved from previous and are 14.6 today (down from 17.8 in 01/2017).   -Given improvement in WBC count, would continue observation for now.  Other cell lines remain preserved as well, which is reassuring; Hgb and platelets normal. Certainly, if her WBCs increase dramatically, could consider starting Hydrea (cytoreductive agent).  She remains asymptomatic and denies any of the common signs/symptoms of PMF including severe fatigue, fevers, prurtis or bone pain.  She has occasional night sweats, which are possibly  d/t menopausal symptoms. She has had minor weight loss (< 10 lbs) in the past ~6 months, that may have been unintentional.   -Return to cancer center in 4 months for follow-up with labs.       Dispo:  -Return to cancer center in 4 months for follow-up with labs.    All questions were answered to patient's stated satisfaction. Encouraged patient to call with any new concerns or questions before her next visit to the cancer center and we can certain see her sooner, if needed.      Orders placed this encounter:  No orders of the defined types were placed in this encounter.     Mike Craze, NP [place] [phone]

## 2018-01-18 ENCOUNTER — Inpatient Hospital Stay (HOSPITAL_COMMUNITY): Payer: 59 | Attending: Hematology

## 2018-01-18 DIAGNOSIS — D72829 Elevated white blood cell count, unspecified: Secondary | ICD-10-CM | POA: Diagnosis present

## 2018-01-18 DIAGNOSIS — D471 Chronic myeloproliferative disease: Secondary | ICD-10-CM | POA: Insufficient documentation

## 2018-01-18 LAB — CBC WITH DIFFERENTIAL/PLATELET
Basophils Absolute: 0 10*3/uL (ref 0.0–0.1)
Basophils Relative: 0 %
EOS ABS: 0.3 10*3/uL (ref 0.0–0.7)
Eosinophils Relative: 2 %
HCT: 44.4 % (ref 36.0–46.0)
HEMOGLOBIN: 14.7 g/dL (ref 12.0–15.0)
LYMPHS ABS: 2.7 10*3/uL (ref 0.7–4.0)
LYMPHS PCT: 19 %
MCH: 29.8 pg (ref 26.0–34.0)
MCHC: 33.1 g/dL (ref 30.0–36.0)
MCV: 90.1 fL (ref 78.0–100.0)
MONOS PCT: 8 %
Monocytes Absolute: 1.1 10*3/uL (ref 0.1–1.0)
Neutro Abs: 10.1 10*3/uL (ref 1.7–7.7)
Neutrophils Relative %: 71 %
Platelets: 287 10*3/uL (ref 150–400)
RBC: 4.93 MIL/uL (ref 3.87–5.11)
RDW: 14.9 % (ref 11.5–15.5)
WBC: 14.3 10*3/uL — AB (ref 4.0–10.5)

## 2018-01-21 ENCOUNTER — Encounter (HOSPITAL_COMMUNITY): Payer: Self-pay | Admitting: Hematology

## 2018-01-21 ENCOUNTER — Inpatient Hospital Stay (HOSPITAL_COMMUNITY): Payer: 59 | Attending: Hematology | Admitting: Hematology

## 2018-01-21 VITALS — BP 131/72 | HR 102 | Temp 97.1°F | Resp 16 | Wt 219.5 lb

## 2018-01-21 DIAGNOSIS — D471 Chronic myeloproliferative disease: Secondary | ICD-10-CM

## 2018-01-21 DIAGNOSIS — C946 Myelodysplastic disease, not classified: Secondary | ICD-10-CM | POA: Diagnosis not present

## 2018-01-21 NOTE — Progress Notes (Signed)
[address] [place], [place] [address]   CLINIC:  Medical Oncology/Hematology  PCP:  Renee Rival, NP [address] [place] [address] [phone]   REASON FOR VISIT:  Follow-up for CALR- positive myeloproliferative neoplasm.   CURRENT THERAPY: observation  CANCER STAGING: Cancer Staging No matching staging information was found for the patient.   INTERVAL HISTORY:  Lindsey Vazquez 47 y.o. female returns for routine follow-up of a positive CALR mutation. Patient is asymptomatic at this time. Patient denies any fevers or recent infections or hospitalizations. Patient states she is experiencing night sweats but feels they are due to her menopause. Patient struggles with peripheral neuropathy due to her diabetes. Patient denies nausea, vomiting or diarrhea. Denies any weight loss.  Overall, she tells me she has been feeling pretty well. Energy levels 100%; appetite 100%.      REVIEW OF SYSTEMS:  Review of Systems  Musculoskeletal:       Knee pain d/t work   All other systems reviewed and are negative.    PAST MEDICAL/SURGICAL HISTORY:  Past Medical History:  Diagnosis Date  . Diabetes mellitus without complication ([place])   . Hyperlipemia    Past Surgical History:  Procedure Laterality Date  . c-section       SOCIAL HISTORY:  Social History   Socioeconomic History  . Marital status: Single    Spouse name: Not on file  . Number of children: Not on file  . Years of education: Not on file  . Highest education level: Not on file  Occupational History  . Not on file  Social Needs  . Financial resource strain: Not on file  . Food insecurity:    Worry: Not on file    Inability: Not on file  . Transportation needs:    Medical: Not on file    Non-medical: Not on file  Tobacco Use  . Smoking status: Former Smoker    Last attempt to quit: 07/25/2015    Years since quitting: 2.4  . Smokeless tobacco: Never Used  Substance and  Sexual Activity  . Alcohol use: No    Comment: socially  . Drug use: No  . Sexual activity: Yes  Lifestyle  . Physical activity:    Days per week: Not on file    Minutes per session: Not on file  . Stress: Not on file  Relationships  . Social connections:    Talks on phone: Not on file    Gets together: Not on file    Attends religious service: Not on file    Active member of club or organization: Not on file    Attends meetings of clubs or organizations: Not on file    Relationship status: Not on file  . Intimate partner violence:    Fear of current or ex partner: Not on file    Emotionally abused: Not on file    Physically abused: Not on file    Forced sexual activity: Not on file  Other Topics Concern  . Not on file  Social History Narrative  . Not on file    FAMILY HISTORY:  History reviewed. No pertinent family history.  CURRENT MEDICATIONS:  Outpatient Encounter Medications as of 01/21/2018  Medication Sig  . atorvastatin (LIPITOR) 10 MG tablet Take 10 mg by mouth daily.  . cholecalciferol (VITAMIN D) 1000 units tablet Take 1,000 Units by mouth once a week.  . Dapagliflozin Propanediol (FARXIGA PO) Take 1 tablet by mouth daily.  Marland Kitchen  empagliflozin (JARDIANCE) 25 MG TABS tablet Take 25 mg by mouth daily.  Marland Kitchen ibuprofen (ADVIL,MOTRIN) 800 MG tablet Take 800 mg by mouth every 8 (eight) hours as needed.  . insulin aspart (NOVOLOG) 100 UNIT/ML injection Inject into the skin 3 (three) times daily before meals.  . insulin glargine (LANTUS) 100 UNIT/ML injection Inject 40 Units into the skin at bedtime.  . metFORMIN (GLUCOPHAGE) 500 MG tablet Take by mouth 2 (two) times daily with a meal.  . Multiple Vitamin (MULTIVITAMIN) tablet Take 1 tablet by mouth daily.  . traZODone (DESYREL) 50 MG tablet Take 50 mg by mouth at bedtime.   No facility-administered encounter medications on file as of 01/21/2018.     ALLERGIES:  No Known Allergies   PHYSICAL EXAM:  ECOG Performance  status: 1  Vitals:   01/21/18 1535  BP: 131/72  Pulse: (!) 102  Resp: 16  Temp: (!) 97.1 F (36.2 C)  SpO2: 97%   Filed Weights   01/21/18 1535  Weight: 219 lb 8 oz (99.6 kg)    Physical Exam HEENT: Oropharynx has no thrush or other lesions. Chest: Bilaterally clear to auscultation. CVS: S1-S2 regular rate and rhythm. Abdomen: No palpable hepatosplenomegaly. Extremities: No edema or cyanosis.  LABORATORY DATA:  I have reviewed the labs as listed.  CBC    Component Value Date/Time   WBC 14.3 (H) 01/18/2018 0811   RBC 4.93 01/18/2018 0811   HGB 14.7 01/18/2018 0811   HCT 44.4 01/18/2018 0811   PLT 287 01/18/2018 0811   MCV 90.1 01/18/2018 0811   MCH 29.8 01/18/2018 0811   MCHC 33.1 01/18/2018 0811   RDW 14.9 01/18/2018 0811   LYMPHSABS 2.7 01/18/2018 0811   MONOABS 1.1 01/18/2018 0811   EOSABS 0.3 01/18/2018 0811   BASOSABS 0.0 01/18/2018 0811   CMP Latest Ref Rng & Units 02/07/2017  Glucose 65 - 99 mg/dL 156(H)  BUN 6 - 20 mg/dL 11  Creatinine 0.44 - 1.00 mg/dL 0.60  Sodium 135 - 145 mmol/L 138  Potassium 3.5 - 5.1 mmol/L 3.8  Chloride 101 - 111 mmol/L 105  CO2 22 - 32 mmol/L 24  Calcium 8.9 - 10.3 mg/dL 9.5  Total Protein 6.5 - 8.1 g/dL 7.9  Total Bilirubin 0.3 - 1.2 mg/dL 0.8  Alkaline Phos 38 - 126 U/L 100  AST 15 - 41 U/L 19  ALT 14 - 54 U/L 16        ASSESSMENT & PLAN:   MPN (myeloproliferative neoplasm) (HCC) 1. CALR positive myeloproliferative neoplasm: - Work-up for leukocytosis showed Jak 2 mutation negative,CALR mutation positive.  She has a normal platelet count.  Hematocrit is below 45.  Does not have any symptoms including aquagenic pruritus, erythromelalgias, or history of thrombosis. - Today labs show white count of 14.3, hematocrit of 44.4 and platelet count of 287.  Clinically no palpable spleen.  No B symptoms including fevers, night sweats or unexplained weight loss.  She is already taking baby aspirin daily. -I have recommended a  bone marrow aspiration and biopsy to see if she has myelofibrosis.  We will arrange it through interventional radiology at [facility].  We will request reticulin staining.  We will see her back after the bone marrow biopsy to discuss the results.      Orders placed this encounter:  Orders Placed This Encounter  Procedures  . Biopsy bone marrow  . CT Biopsy  . CBC with Differential/Platelet  . Comprehensive metabolic panel  . Lactate dehydrogenase  Derek Jack, MD [place] [phone]

## 2018-01-21 NOTE — Patient Instructions (Signed)
[facility] at [facility] Discharge Instructions  You saw Dr. Katragadda today.   Thank you for choosing [facility] at [facility] to provide your oncology and hematology care.  To afford each patient quality time with our provider, please arrive at least 15 minutes before your scheduled appointment time.   If you have a lab appointment with the Cancer Center please come in thru the  Main Entrance and check in at the main information desk  You need to re-schedule your appointment should you arrive 10 or more minutes late.  We strive to give you quality time with our providers, and arriving late affects you and other patients whose appointments are after yours.  Also, if you no show three or more times for appointments you may be dismissed from the clinic at the providers discretion.     Again, thank you for choosing [facility].  Our hope is that these requests will decrease the amount of time that you wait before being seen by our physicians.       _____________________________________________________________  Should you have questions after your visit to  Cancer Center, please contact our office at [phone] between the hours of 8:30 a.m. and 4:30 p.m.  Voicemails left after 4:30 p.m. will not be returned until the following business day.  For prescription refill requests, have your pharmacy contact our office.       Resources For Cancer Patients and their Caregivers ? American Cancer Society: Can assist with transportation, wigs, general needs, runs Look Good Feel Better.        1-888-227-6333 ? Cancer Care: Provides financial assistance, online support groups, medication/co-pay assistance.  1-800-813-HOPE (4673) ? [facility] Assists [place] cancer patients and their families through emotional , educational and financial support.  336-427-4357 ? [place] DSS Where to apply for  food stamps, Medicaid and utility assistance. 336-342-1394 ? RCATS: Transportation to medical appointments. 336-347-2287 ? Social Security Administration: May apply for disability if have a Stage IV cancer. 336-342-7796 1-800-772-1213 ? [place] Aging, Disability and Transit Services: Assists with nutrition, care and transit needs. 336-349-2343  Cancer Center Support Programs:   > Cancer Support Group  2nd Tuesday of the month 1pm-2pm, Journey Room   > Creative Journey  3rd Tuesday of the month 1130am-1pm, Journey Room     

## 2018-01-22 ENCOUNTER — Other Ambulatory Visit (HOSPITAL_COMMUNITY): Payer: Self-pay | Admitting: Nurse Practitioner

## 2018-01-22 DIAGNOSIS — D471 Chronic myeloproliferative disease: Secondary | ICD-10-CM | POA: Insufficient documentation

## 2018-01-22 NOTE — Assessment & Plan Note (Addendum)
1. CALR positive myeloproliferative neoplasm: - Work-up for leukocytosis showed Jak 2 mutation negative,CALR mutation positive.  She has a normal platelet count.  Hematocrit is below 45.  Does not have any symptoms including aquagenic pruritus, erythromelalgias, or history of thrombosis. - Today labs show white count of 14.3, hematocrit of 44.4 and platelet count of 287.  Clinically no palpable spleen.  No B symptoms including fevers, night sweats or unexplained weight loss.  She is already taking baby aspirin daily. -I have recommended a bone marrow aspiration and biopsy to see if she has myelofibrosis.  We will arrange it through interventional radiology at [facility] long.  We will request reticulin staining.  We will see her back after the bone marrow biopsy to discuss the results.

## 2018-02-04 ENCOUNTER — Ambulatory Visit (HOSPITAL_COMMUNITY): Payer: 59

## 2018-03-29 ENCOUNTER — Other Ambulatory Visit (HOSPITAL_COMMUNITY): Payer: 59

## 2018-03-29 ENCOUNTER — Ambulatory Visit (HOSPITAL_COMMUNITY): Payer: 59 | Admitting: Hematology

## 2018-04-15 ENCOUNTER — Ambulatory Visit (INDEPENDENT_AMBULATORY_CARE_PROVIDER_SITE_OTHER): Payer: Self-pay | Admitting: "Endocrinology

## 2018-04-15 ENCOUNTER — Encounter: Payer: Self-pay | Admitting: "Endocrinology

## 2018-04-15 VITALS — BP 113/77 | HR 93 | Ht 62.0 in | Wt 200.8 lb

## 2018-04-15 DIAGNOSIS — I10 Essential (primary) hypertension: Secondary | ICD-10-CM

## 2018-04-15 DIAGNOSIS — E1165 Type 2 diabetes mellitus with hyperglycemia: Secondary | ICD-10-CM | POA: Insufficient documentation

## 2018-04-15 DIAGNOSIS — E782 Mixed hyperlipidemia: Secondary | ICD-10-CM

## 2018-04-15 NOTE — Progress Notes (Signed)
Endocrinology Consult Note       04/15/2018, 9:52 AM   Subjective:    Patient ID: Lindsey Vazquez, female    DOB: 02/12/1971.  Lindsey Vazquez is being seen in consultation for management of currently uncontrolled symptomatic diabetes requested by  Renee Rival, NP.   Past Medical History:  Diagnosis Date  . Diabetes mellitus without complication ([place])   . Hyperlipemia    Past Surgical History:  Procedure Laterality Date  . c-section     Social History   Socioeconomic History  . Marital status: Married    Spouse name: Not on file  . Number of children: Not on file  . Years of education: Not on file  . Highest education level: Not on file  Occupational History  . Not on file  Social Needs  . Financial resource strain: Not on file  . Food insecurity:    Worry: Not on file    Inability: Not on file  . Transportation needs:    Medical: Not on file    Non-medical: Not on file  Tobacco Use  . Smoking status: Former Smoker    Last attempt to quit: 07/25/2015    Years since quitting: 2.7  . Smokeless tobacco: Never Used  Substance and Sexual Activity  . Alcohol use: No    Comment: socially  . Drug use: No  . Sexual activity: Yes  Lifestyle  . Physical activity:    Days per week: Not on file    Minutes per session: Not on file  . Stress: Not on file  Relationships  . Social connections:    Talks on phone: Not on file    Gets together: Not on file    Attends religious service: Not on file    Active member of club or organization: Not on file    Attends meetings of clubs or organizations: Not on file    Relationship status: Not on file  Other Topics Concern  . Not on file  Social History Narrative  . Not on file   Outpatient Encounter Medications as of 04/15/2018  Medication Sig  . insulin degludec (TRESIBA) 100 UNIT/ML SOPN FlexTouch Pen Inject 50 Units into the skin at  bedtime.  . insulin lispro (HUMALOG) 100 UNIT/ML KiwkPen Inject 15-21 Units into the skin 3 (three) times daily before meals.  Marland Kitchen atorvastatin (LIPITOR) 10 MG tablet Take 10 mg by mouth daily.  . cholecalciferol (VITAMIN D) 1000 units tablet Take 1,000 Units by mouth once a week.  Marland Kitchen ibuprofen (ADVIL,MOTRIN) 800 MG tablet Take 800 mg by mouth every 8 (eight) hours as needed.  . Multiple Vitamin (MULTIVITAMIN) tablet Take 1 tablet by mouth daily.  . traZODone (DESYREL) 50 MG tablet Take 50 mg by mouth at bedtime.  . [DISCONTINUED] Dapagliflozin Propanediol (FARXIGA PO) Take 1 tablet by mouth daily.  . [DISCONTINUED] empagliflozin (JARDIANCE) 25 MG TABS tablet Take 25 mg by mouth daily.  . [DISCONTINUED] insulin aspart (NOVOLOG) 100 UNIT/ML injection Inject 15-21 Units into the skin 3 (three) times daily before meals.  . [DISCONTINUED] insulin glargine (LANTUS) 100 UNIT/ML injection Inject 40 Units into  the skin at bedtime.  . [DISCONTINUED] metFORMIN (GLUCOPHAGE) 500 MG tablet Take by mouth 2 (two) times daily with a meal.   No facility-administered encounter medications on file as of 04/15/2018.     ALLERGIES: No Known Allergies  VACCINATION STATUS:  There is no immunization history on file for this patient.  Diabetes  She presents for her initial diabetic visit. She has type 2 diabetes mellitus. Onset time: Diagnosed at approximate age of 77 years, after episode of gestational diabetes. There are no hypoglycemic associated symptoms. Pertinent negatives for hypoglycemia include no confusion, headaches, pallor or seizures. Associated symptoms include fatigue, foot paresthesias, polydipsia and polyphagia. Pertinent negatives for diabetes include no chest pain and no polyuria. There are no hypoglycemic complications. Symptoms are worsening (She was recently hospitalized for DKA.). Diabetic complications include nephropathy. (Diabetes ketoacidosis.  She had acute renal failure which required brief.   Of hemodialysis during her hospitalization.) Risk factors for coronary artery disease include diabetes mellitus, dyslipidemia, hypertension, family history, obesity, sedentary lifestyle and tobacco exposure. Current diabetic treatment includes insulin injections. Her weight is decreasing steadily. She is following a generally unhealthy diet. When asked about meal planning, she reported none. She rarely participates in exercise. Her overall blood glucose range is >200 mg/dl. (She brought her meter showing average blood glucose of 221 over the last 7 days.  She monitored 12 times) An ACE inhibitor/angiotensin II receptor blocker is not being taken.  Hyperlipidemia  This is a chronic problem. The current episode started more than 1 year ago. The problem is uncontrolled. Exacerbating diseases include diabetes and obesity. Pertinent negatives include no chest pain, myalgias or shortness of breath. Current antihyperlipidemic treatment includes statins. Risk factors for coronary artery disease include dyslipidemia, diabetes mellitus, hypertension, family history, obesity and a sedentary lifestyle.  Hypertension  This is a chronic problem. The current episode started more than 1 year ago. Pertinent negatives include no chest pain, headaches, palpitations or shortness of breath. Risk factors for coronary artery disease include diabetes mellitus, dyslipidemia, sedentary lifestyle, smoking/tobacco exposure and obesity.      Review of Systems  Constitutional: Positive for fatigue. Negative for chills, fever and unexpected weight change.  HENT: Negative for trouble swallowing and voice change.   Eyes: Negative for visual disturbance.  Respiratory: Negative for cough, shortness of breath and wheezing.   Cardiovascular: Negative for chest pain, palpitations and leg swelling.  Gastrointestinal: Negative for diarrhea, nausea and vomiting.  Endocrine: Positive for polydipsia and polyphagia. Negative for cold  intolerance, heat intolerance and polyuria.  Musculoskeletal: Negative for arthralgias and myalgias.  Skin: Negative for color change, pallor, rash and wound.  Neurological: Negative for seizures and headaches.  Psychiatric/Behavioral: Negative for confusion and suicidal ideas.    Objective:    BP 113/77   Pulse 93   Ht 5\' 2"  (1.575 m)   Wt 200 lb 12.8 oz (91.1 kg)   SpO2 97%   BMI 36.73 kg/m   Wt Readings from Last 3 Encounters:  04/15/18 200 lb 12.8 oz (91.1 kg)  01/21/18 219 lb 8 oz (99.6 kg)  09/20/17 219 lb 3.2 oz (99.4 kg)     Physical Exam  Constitutional: She is oriented to person, place, and time. She appears well-developed.  HENT:  Head: Normocephalic and atraumatic.  Eyes: EOM are normal.  Neck: Normal range of motion. Neck supple. No tracheal deviation present. No thyromegaly present.  Cardiovascular: Normal rate and regular rhythm.  Pulmonary/Chest: Effort normal and breath sounds normal.  Abdominal: Soft.  Bowel sounds are normal. There is no tenderness. There is no guarding.  Musculoskeletal: Normal range of motion. She exhibits no edema.  Neurological: She is alert and oriented to person, place, and time. She has normal reflexes. No cranial nerve deficit. Coordination normal.  Skin: Skin is warm and dry. No rash noted. No erythema. No pallor.  Psychiatric: She has a normal mood and affect. Judgment normal.      CMP ( most recent) CMP     Component Value Date/Time   NA 138 02/07/2017 1333   K 3.8 02/07/2017 1333   CL 105 02/07/2017 1333   CO2 24 02/07/2017 1333   GLUCOSE 156 (H) 02/07/2017 1333   BUN 11 02/07/2017 1333   CREATININE 0.60 02/07/2017 1333   CALCIUM 9.5 02/07/2017 1333   PROT 7.9 02/07/2017 1333   ALBUMIN 4.3 02/07/2017 1333   AST 19 02/07/2017 1333   ALT 16 02/07/2017 1333   ALKPHOS 100 02/07/2017 1333   BILITOT 0.8 02/07/2017 1333   GFRNONAA >60 02/07/2017 1333   GFRAA >60 02/07/2017 1333     Assessment & Plan:   1.  Uncontrolled type 2 diabetes mellitus with hyperglycemia ([facility]CC)  - Tannisha G Delk has currently uncontrolled symptomatic type 2 DM since 47 years of age,  with most recent A1c of 8.9 %. Recent labs reviewed. Patient was hospitalized on February 16, 2018 for euglycemic diabetic ketoacidosis which was associated with acute renal failure requiring brief period of hemodialysis. -her diabetes is complicated by DKA, obesity, and sedentary life and she remains at a high risk for more acute and chronic complications which include CAD, CVA, CKD, retinopathy, and neuropathy. These are all discussed in detail with her.  - I have counseled her on diet management and weight loss, by adopting a carbohydrate restricted/protein rich diet.  - Suggestion is made for her to avoid simple carbohydrates  from her diet including Cakes, Sweet Desserts, Ice Cream, Soda (diet and regular), Sweet Tea, Candies, Chips, Cookies, Store Bought Juices, Alcohol in Excess of  1-2 drinks a day, Artificial Sweeteners,  Coffee Creamer, and "Sugar-free" Products. This will help patient to have more stable blood glucose profile and potentially avoid unintended weight gain.  - I encouraged her to switch to  unprocessed or minimally processed complex starch and increased protein intake (animal or plant source), fruits, and vegetables.  - she is advised to stick to a routine mealtimes to eat 3 meals  a day and avoid unnecessary snacks ( to snack only to correct hypoglycemia).   - she will be scheduled with Jearld Fenton, RDN, CDE for individualized diabetes education.  - I have approached her with the following individualized plan to manage diabetes and patient agrees:   -Based on her current glycemic burden, recent euglycemic DKA, I discussed with her the need for continued intensive treatment with basal/bolus insulin treatment in order for her to achieve and maintain control of diabetes to target.   -In the meantime, I advised her to  increase her basal insulin Tresiba to 50 units daily at bedtime , and  adjust her prandial insulin Humalog to 15  units 3 times a day with meals  for pre-meal BG readings of 90-150mg /dl, plus patient specific correction dose for unexpected hyperglycemia above 150mg /dl, associated with strict monitoring of glucose 4 times a day-before meals and at bedtime. - she is warned not to take insulin without proper monitoring per orders. -Adjustment parameters are given for hypo and hyperglycemia in writing. - she is  encouraged to call clinic for blood glucose levels less than 70 or above 300 mg /dl. -Have repeat labs in 4 weeks, will be reconsidered for low-dose metformin therapy if her renal function is normal.  -Advised to stay away from SGLT2 inhibitors given her recent history of euglycemic DKA while taking Jardiance.   - she will be considered for incretin therapy as appropriate next visit. - Patient specific target  A1c;  LDL, HDL, Triglycerides, and  Waist Circumference were discussed in detail.  2) BP/HTN:  her blood pressure is controlled controlled to target.   she is currently not taking any blood pressure medications.    3) Lipids/HPL: She does not have recent lipid panel to review.  I advised her to continue atorvastatin 10 mg p.o. nightly. Side effects and precautions discussed with her.  4)  Weight/Diet:  Body mass index is 36.73 kg/m.  - clearly complicating her diabetes care.  I discussed with her the fact that loss of 5 - 10% of her  current body weight will have the most impact on her diabetes management.  CDE Consult will be initiated . Exercise, and detailed carbohydrates information provided  -  detailed on discharge instructions.  5) Chronic Care/Health Maintenance:  -she  is on Statin medications and  is encouraged to initiate and continue to follow up with Ophthalmology, Dentist,  Podiatrist at least yearly or according to recommendations, and advised to   stay away from smoking. I  have recommended yearly flu vaccine and pneumonia vaccine at least every 5 years; moderate intensity exercise for up to 150 minutes weekly; and  sleep for at least 7 hours a day.  - I advised patient to maintain close follow up with Renee Rival, NP for primary care needs.  - Time spent with the patient: 45 minutes, of which >50% was spent in obtaining information about her symptoms, reviewing her previous labs, evaluations, and treatments, counseling her about her currently uncontrolled type 2 diabetes, hyperlipidemia, hypertension, and developing developing  plans for long term treatment based on the latest recommendations.  Lindsey Vazquez participated in the discussions, expressed understanding, and voiced agreement with the above plans.  All questions were answered to her satisfaction. she is encouraged to contact clinic should she have any questions or concerns prior to her return visit.  Follow up plan: - Return in about 5 weeks (around 05/20/2018) for Meter, and Logs.  Glade Lloyd, MD [facility] [address] [place], [place] [address] Phone: [phone]  Fax: [phone]    04/15/2018, 9:52 AM  This note was partially dictated with voice recognition software. Similar sounding words can be transcribed inadequately or may not  be corrected upon review.

## 2018-04-15 NOTE — Patient Instructions (Signed)

## 2018-05-17 LAB — COMPLETE METABOLIC PANEL WITH GFR
AG Ratio: 1.3 (calc) (ref 1.0–2.5)
ALKALINE PHOSPHATASE (APISO): 100 U/L (ref 33–115)
ALT: 11 U/L (ref 6–29)
AST: 14 U/L (ref 10–35)
Albumin: 4.1 g/dL (ref 3.6–5.1)
BUN: 11 mg/dL (ref 7–25)
CALCIUM: 9.8 mg/dL (ref 8.6–10.2)
CO2: 27 mmol/L (ref 20–32)
CREATININE: 0.54 mg/dL (ref 0.50–1.10)
Chloride: 103 mmol/L (ref 98–110)
GFR, EST NON AFRICAN AMERICAN: 111 mL/min/{1.73_m2} (ref >=60)
GFR, Est African American: 128 mL/min/{1.73_m2} (ref >=60)
GLUCOSE: 148 mg/dL — AB (ref 65–99)
Globulin: 3.1 g/dL (calc) (ref 1.9–3.7)
Potassium: 4.3 mmol/L (ref 3.5–5.3)
Sodium: 138 mmol/L (ref 135–146)
Total Bilirubin: 0.4 mg/dL (ref 0.2–1.2)
Total Protein: 7.2 g/dL (ref 6.1–8.1)

## 2018-05-17 LAB — MICROALBUMIN / CREATININE URINE RATIO
Creatinine, Urine: 149 mg/dL (ref 20–275)
MICROALB UR: 1.4 mg/dL
Microalb Creat Ratio: 9 mcg/mg creat (ref <30)

## 2018-05-17 LAB — HEMOGLOBIN A1C
HEMOGLOBIN A1C: 10.1 %{Hb} — AB (ref <5.7)
MEAN PLASMA GLUCOSE: 243 (calc)
eAG (mmol/L): 13.5 (calc)

## 2018-05-17 LAB — T4, FREE: Free T4: 1.2 ng/dL (ref 0.8–1.8)

## 2018-05-17 LAB — VITAMIN D 25 HYDROXY (VIT D DEFICIENCY, FRACTURES): Vit D, 25-Hydroxy: 19 ng/mL — ABNORMAL LOW (ref 30–100)

## 2018-05-17 LAB — TSH: TSH: 0.91 mIU/L

## 2018-05-20 ENCOUNTER — Telehealth: Payer: Self-pay | Admitting: "Endocrinology

## 2018-05-20 MED ORDER — GLUCOSE BLOOD VI STRP: 1.0000 | ORAL_STRIP | Freq: Four times a day (QID) | 5 refills | Status: DC

## 2018-05-20 NOTE — Telephone Encounter (Signed)
Lindsey Vazquez is calling requesting test strips to be refilled she uses the freestyle lite please call to [employer]

## 2018-05-23 ENCOUNTER — Ambulatory Visit (INDEPENDENT_AMBULATORY_CARE_PROVIDER_SITE_OTHER): Payer: 59 | Admitting: "Endocrinology

## 2018-05-23 ENCOUNTER — Encounter: Payer: Self-pay | Admitting: "Endocrinology

## 2018-05-23 VITALS — BP 115/75 | HR 96 | Ht 62.0 in | Wt 205.0 lb

## 2018-05-23 DIAGNOSIS — E1165 Type 2 diabetes mellitus with hyperglycemia: Secondary | ICD-10-CM

## 2018-05-23 DIAGNOSIS — I10 Essential (primary) hypertension: Secondary | ICD-10-CM | POA: Diagnosis not present

## 2018-05-23 DIAGNOSIS — E782 Mixed hyperlipidemia: Secondary | ICD-10-CM | POA: Diagnosis not present

## 2018-05-23 DIAGNOSIS — E559 Vitamin D deficiency, unspecified: Secondary | ICD-10-CM

## 2018-05-23 MED ORDER — ATORVASTATIN CALCIUM 20 MG PO TABS: 10.0000 mg | ORAL_TABLET | Freq: Every day | ORAL | 6 refills | Status: DC

## 2018-05-23 MED ORDER — METFORMIN HCL 500 MG PO TABS: 500.0000 mg | ORAL_TABLET | Freq: Two times a day (BID) | ORAL | 2 refills | Status: DC

## 2018-05-23 MED ORDER — VITAMIN D-3 125 MCG (5000 UT) PO TABS: 1000.0000 [IU] | ORAL_TABLET | ORAL | 6 refills | Status: DC

## 2018-05-23 NOTE — Progress Notes (Signed)
Endocrinology follow-up note       05/23/2018, 2:41 PM   Subjective:    Patient ID: Lindsey Vazquez, female    DOB: 10/25/1970.  Lindsey Vazquez is being seen in consultation for management of currently uncontrolled symptomatic type 2 diabetes, hyperlipidemia, vitamin D deficiency. PCP:   Renee Rival, NP.   Past Medical History:  Diagnosis Date  . Diabetes mellitus without complication ([place])   . Hyperlipemia    Past Surgical History:  Procedure Laterality Date  . c-section     Social History   Socioeconomic History  . Marital status: Married    Spouse name: Not on file  . Number of children: Not on file  . Years of education: Not on file  . Highest education level: Not on file  Occupational History  . Not on file  Social Needs  . Financial resource strain: Not on file  . Food insecurity:    Worry: Not on file    Inability: Not on file  . Transportation needs:    Medical: Not on file    Non-medical: Not on file  Tobacco Use  . Smoking status: Former Smoker    Last attempt to quit: 07/25/2015    Years since quitting: 2.8  . Smokeless tobacco: Never Used  Substance and Sexual Activity  . Alcohol use: No    Comment: socially  . Drug use: No  . Sexual activity: Yes  Lifestyle  . Physical activity:    Days per week: Not on file    Minutes per session: Not on file  . Stress: Not on file  Relationships  . Social connections:    Talks on phone: Not on file    Gets together: Not on file    Attends religious service: Not on file    Active member of club or organization: Not on file    Attends meetings of clubs or organizations: Not on file    Relationship status: Not on file  Other Topics Concern  . Not on file  Social History Narrative  . Not on file   Outpatient Encounter Medications as of 05/23/2018  Medication Sig  . atorvastatin (LIPITOR) 20 MG tablet Take 0.5  tablets (10 mg total) by mouth daily.  . cholecalciferol 5000 units TABS Take 1,000 Units by mouth once a week.  Marland Kitchen glucose blood test strip 1 each by Other route 4 (four) times daily. Use as instructed 4 x daily. E11.65  . ibuprofen (ADVIL,MOTRIN) 800 MG tablet Take 800 mg by mouth every 8 (eight) hours as needed.  . insulin degludec (TRESIBA) 100 UNIT/ML SOPN FlexTouch Pen Inject 50 Units into the skin at bedtime.  . insulin lispro (HUMALOG) 100 UNIT/ML KiwkPen Inject 18-24 Units into the skin 3 (three) times daily before meals.  . metFORMIN (GLUCOPHAGE) 500 MG tablet Take 1 tablet (500 mg total) by mouth 2 (two) times daily with a meal.  . Multiple Vitamin (MULTIVITAMIN) tablet Take 1 tablet by mouth daily.  . traZODone (DESYREL) 50 MG tablet Take 50 mg by mouth at bedtime.  . [DISCONTINUED] atorvastatin (LIPITOR) 10 MG tablet Take 10 mg  by mouth daily.  . [DISCONTINUED] cholecalciferol (VITAMIN D) 1000 units tablet Take 1,000 Units by mouth once a week.   No facility-administered encounter medications on file as of 05/23/2018.     ALLERGIES: No Known Allergies  VACCINATION STATUS:  There is no immunization history on file for this patient.  Diabetes  She presents for her follow-up diabetic visit. She has type 2 diabetes mellitus. Onset time: Diagnosed at approximate age of 68 years, after episode of gestational diabetes. Her disease course has been worsening. There are no hypoglycemic associated symptoms. Pertinent negatives for hypoglycemia include no confusion, headaches, pallor or seizures. Associated symptoms include fatigue, foot paresthesias, polydipsia and polyphagia. Pertinent negatives for diabetes include no chest pain and no polyuria. There are no hypoglycemic complications. Symptoms are worsening (She was recently hospitalized for DKA.). Diabetic complications include nephropathy. (Diabetes ketoacidosis.  She had acute renal failure which required brief.  Of hemodialysis during  her hospitalization.) Risk factors for coronary artery disease include diabetes mellitus, dyslipidemia, hypertension, family history, obesity, sedentary lifestyle and tobacco exposure. Current diabetic treatment includes insulin injections. Her weight is fluctuating minimally. She is following a generally unhealthy diet. When asked about meal planning, she reported none. She rarely participates in exercise. Her breakfast blood glucose range is generally 180-200 mg/dl. Her lunch blood glucose range is generally >200 mg/dl. Her dinner blood glucose range is generally >200 mg/dl. Her bedtime blood glucose range is generally >200 mg/dl. Her overall blood glucose range is >200 mg/dl. An ACE inhibitor/angiotensin II receptor blocker is not being taken.  Hyperlipidemia  This is a chronic problem. The current episode started more than 1 year ago. The problem is uncontrolled. Exacerbating diseases include diabetes and obesity. Pertinent negatives include no chest pain, myalgias or shortness of breath. Current antihyperlipidemic treatment includes statins. Risk factors for coronary artery disease include dyslipidemia, diabetes mellitus, hypertension, family history, obesity and a sedentary lifestyle.  Hypertension  This is a chronic problem. The current episode started more than 1 year ago. Pertinent negatives include no chest pain, headaches, palpitations or shortness of breath. Risk factors for coronary artery disease include diabetes mellitus, dyslipidemia, sedentary lifestyle, smoking/tobacco exposure and obesity.     Review of Systems  Constitutional: Positive for fatigue. Negative for chills, fever and unexpected weight change.  HENT: Negative for trouble swallowing and voice change.   Eyes: Negative for visual disturbance.  Respiratory: Negative for cough, shortness of breath and wheezing.   Cardiovascular: Negative for chest pain, palpitations and leg swelling.  Gastrointestinal: Negative for diarrhea,  nausea and vomiting.  Endocrine: Positive for polydipsia and polyphagia. Negative for cold intolerance, heat intolerance and polyuria.  Musculoskeletal: Negative for arthralgias and myalgias.  Skin: Negative for color change, pallor, rash and wound.  Neurological: Negative for seizures and headaches.  Psychiatric/Behavioral: Negative for confusion and suicidal ideas.    Objective:    BP 115/75   Pulse 96   Ht 5\' 2"  (1.575 m)   Wt 205 lb (93 kg)   BMI 37.49 kg/m   Wt Readings from Last 3 Encounters:  05/23/18 205 lb (93 kg)  04/15/18 200 lb 12.8 oz (91.1 kg)  01/21/18 219 lb 8 oz (99.6 kg)     Physical Exam  Constitutional: She is oriented to person, place, and time. She appears well-developed.  HENT:  Head: Normocephalic and atraumatic.  Eyes: EOM are normal.  Neck: Normal range of motion. Neck supple. No tracheal deviation present. No thyromegaly present.  Cardiovascular: Normal rate and regular rhythm.  Pulmonary/Chest: Effort normal and breath sounds normal.  Abdominal: Soft. Bowel sounds are normal. There is no tenderness. There is no guarding.  Musculoskeletal: Normal range of motion. She exhibits no edema.  Neurological: She is alert and oriented to person, place, and time. She has normal reflexes. No cranial nerve deficit. Coordination normal.  Skin: Skin is warm and dry. No rash noted. No erythema. No pallor.  Psychiatric: She has a normal mood and affect. Judgment normal.      CMP ( most recent) CMP     Component Value Date/Time   NA 138 05/16/2018 0711   K 4.3 05/16/2018 0711   CL 103 05/16/2018 0711   CO2 27 05/16/2018 0711   GLUCOSE 148 (H) 05/16/2018 0711   BUN 11 05/16/2018 0711   CREATININE 0.54 05/16/2018 0711   CALCIUM 9.8 05/16/2018 0711   PROT 7.2 05/16/2018 0711   ALBUMIN 4.3 02/07/2017 1333   AST 14 05/16/2018 0711   ALT 11 05/16/2018 0711   ALKPHOS 100 02/07/2017 1333   BILITOT 0.4 05/16/2018 0711   GFRNONAA 111 05/16/2018 0711   GFRAA  128 05/16/2018 0711   Recent Results (from the past 2160 hour(s))  Hemoglobin A1c     Status: Abnormal   Collection Time: 05/16/18  7:11 AM  Result Value Ref Range   Hgb A1c MFr Bld 10.1 (H) <5.7 % of total Hgb    Comment: For someone without known diabetes, a hemoglobin A1c value of 6.5% or greater indicates that they may have  diabetes and this should be confirmed with a follow-up  test. . For someone with known diabetes, a value <7% indicates  that their diabetes is well controlled and a value  greater than or equal to 7% indicates suboptimal  control. A1c targets should be individualized based on  duration of diabetes, age, comorbid conditions, and  other considerations. . Currently, no consensus exists regarding use of hemoglobin A1c for diagnosis of diabetes for children. .    Mean Plasma Glucose 243 (calc)   eAG (mmol/L) 13.5 (calc)  COMPLETE METABOLIC PANEL WITH GFR     Status: Abnormal   Collection Time: 05/16/18  7:11 AM  Result Value Ref Range   Glucose, Bld 148 (H) 65 - 99 mg/dL    Comment: .            Fasting reference interval . For someone without known diabetes, a glucose value >125 mg/dL indicates that they may have diabetes and this should be confirmed with a follow-up test. .    BUN 11 7 - 25 mg/dL   Creat 0.54 0.50 - 1.10 mg/dL   GFR, Est Non African American 111 > OR = 60 mL/min/1.25m2   GFR, Est African American 128 > OR = 60 mL/min/1.39m2   BUN/Creatinine Ratio NOT APPLICABLE 6 - 22 (calc)   Sodium 138 135 - 146 mmol/L   Potassium 4.3 3.5 - 5.3 mmol/L   Chloride 103 98 - 110 mmol/L   CO2 27 20 - 32 mmol/L   Calcium 9.8 8.6 - 10.2 mg/dL   Total Protein 7.2 6.1 - 8.1 g/dL   Albumin 4.1 3.6 - 5.1 g/dL   Globulin 3.1 1.9 - 3.7 g/dL (calc)   AG Ratio 1.3 1.0 - 2.5 (calc)   Total Bilirubin 0.4 0.2 - 1.2 mg/dL   Alkaline phosphatase (APISO) 100 33 - 115 U/L   AST 14 10 - 35 U/L   ALT 11 6 - 29 U/L  Microalbumin / creatinine urine  ratio      Status: None   Collection Time: 05/16/18  7:11 AM  Result Value Ref Range   Creatinine, Urine 149 20 - 275 mg/dL   Microalb, Ur 1.4 mg/dL    Comment: Reference Range Not established    Microalb Creat Ratio 9 <30 mcg/mg creat    Comment: . The ADA defines abnormalities in albumin excretion as follows: Marland Kitchen Category         Result (mcg/mg creatinine) . Normal                    <30 Microalbuminuria         30-299  Clinical albuminuria   > OR = 300 . The ADA recommends that at least two of three specimens collected within a 3-6 month period be abnormal before considering a patient to be within a diagnostic category.   T4, free     Status: None   Collection Time: 05/16/18  7:11 AM  Result Value Ref Range   Free T4 1.2 0.8 - 1.8 ng/dL  TSH     Status: None   Collection Time: 05/16/18  7:11 AM  Result Value Ref Range   TSH 0.91 mIU/L    Comment:           Reference Range .           > or = 20 Years  0.40-4.50 .                Pregnancy Ranges           First trimester    0.26-2.66           Second trimester   0.55-2.73           Third trimester    0.43-2.91   VITAMIN D 25 Hydroxy (Vit-D Deficiency, Fractures)     Status: Abnormal   Collection Time: 05/16/18  7:11 AM  Result Value Ref Range   Vit D, 25-Hydroxy 19 (L) 30 - 100 ng/mL    Comment: Vitamin D Status         25-OH Vitamin D: . Deficiency:                    <20 ng/mL Insufficiency:             20 - 29 ng/mL Optimal:                 > or = 30 ng/mL . For 25-OH Vitamin D testing on patients on  D2-supplementation and patients for whom quantitation  of D2 and D3 fractions is required, the QuestAssureD(TM) 25-OH VIT D, (D2,D3), LC/MS/MS is recommended: order  code [id number] (patients >66yrs). . For more information on this test, go to: http://education.questdiagnostics.com/faq/FAQ163 (This link is being provided for  informational/educational purposes only.)      Assessment & Plan:   1. Uncontrolled type 2  diabetes mellitus with hyperglycemia (HCC)  - Hagar G Faulks has currently uncontrolled symptomatic type 2 DM since 47 years of age. -She returns with significantly above target glycemic profile especially postprandially.  Her previsit labs show A1c of 10.1% increasing from 8.9%.    Recent labs reviewed, which also shows significant vitamin D deficiency.    Patient was hospitalized on February 16, 2018 for  euglycemic diabetic ketoacidosis which was associated with acute renal failure requiring brief period of hemodialysis. -her diabetes is complicated by DKA, obesity, and sedentary life and she remains at a high  risk for more acute and chronic complications which include CAD, CVA, CKD, retinopathy, and neuropathy. These are all discussed in detail with her.  - I have counseled her on diet management and weight loss, by adopting a carbohydrate restricted/protein rich diet.  -  Suggestion is made for her to avoid simple carbohydrates  from her diet including Cakes, Sweet Desserts / Pastries, Ice Cream, Soda (diet and regular), Sweet Tea, Candies, Chips, Cookies, Store Bought Juices, Alcohol in Excess of  1-2 drinks a day, Artificial Sweeteners, and "Sugar-free" Products. This will help patient to have stable blood glucose profile and potentially avoid unintended weight gain.  - I encouraged her to switch to  unprocessed or minimally processed complex starch and increased protein intake (animal or plant source), fruits, and vegetables.  - she is advised to stick to a routine mealtimes to eat 3 meals  a day and avoid unnecessary snacks ( to snack only to correct hypoglycemia).   - I have approached her with the following individualized plan to manage diabetes and patient agrees:   -Based on her current glycemic burden, recent euglycemic DKA, I discussed with her the need for continued intensive treatment with basal/bolus insulin treatment in order for her to achieve and maintain control of  diabetes to target.   -He is advised to continue Tresiba 50 units daily at bedtime, increase her Humalog to 18 units 3 times a day with meals  for pre-meal BG readings of 90-150mg /dl, plus patient specific correction dose for unexpected hyperglycemia above 150mg /dl, associated with strict monitoring of glucose 4 times a day-before meals and at bedtime. - she is warned not to take insulin without proper monitoring per orders. -Adjustment parameters are given for hypo and hyperglycemia in writing. - she is encouraged to call clinic for blood glucose levels less than 70 or above 300 mg /dl.   -Advised to stay away from SGLT2 inhibitors given her recent history of euglycemic DKA while taking Jardiance.  -Her renal function is normal, will benefit from metformin therapy.  I discussed and initiated low-dose metformin at 500 mg p.o. twice daily.  Side effects and precautions discussed with her.  - she will be considered for incretin therapy as appropriate next visit. - Patient specific target  A1c;  LDL, HDL, Triglycerides, and  Waist Circumference were discussed in detail.  2) BP/HTN:  her blood pressure is controlled controlled to target.   she is currently not taking any blood pressure medications.    3) Lipids/HPL: She does not have recent lipid panel to review.  I advised her to continue atorvastatin 10 mg p.o. nightly. Side effects and precautions discussed with her.  4)  Weight/Diet:  Body mass index is 37.49 kg/m.  - clearly complicating her diabetes care.  I discussed with her the fact that loss of 5 - 10% of her  current body weight will have the most impact on her diabetes management.  CDE Consult will be initiated . Exercise, and detailed carbohydrates information provided  -  detailed on discharge instructions.  5) timing D deficiency-new diagnosis for her Discussed and initiated having D3 5000 units daily for the next 90 days.    6) Chronic Care/Health Maintenance:  -she  is on Statin  medications and  is encouraged to initiate and continue to follow up with Ophthalmology, Dentist,  Podiatrist at least yearly or according to recommendations, and advised to   stay away from smoking. I have recommended yearly flu vaccine and pneumonia vaccine at least  every 5 years; moderate intensity exercise for up to 150 minutes weekly; and  sleep for at least 7 hours a day.  - I advised patient to maintain close follow up with Renee Rival, NP for primary care needs.  - Time spent with the patient: 25 min, of which >50% was spent in reviewing her blood glucose logs , discussing her hypo- and hyper-glycemic episodes, reviewing her current and  previous labs and insulin doses and developing a plan to avoid hypo- and hyper-glycemia. Please refer to Patient Instructions for Blood Glucose Monitoring and Insulin/Medications Dosing Guide"  in media tab for additional information. Lindsey Vazquez participated in the discussions, expressed understanding, and voiced agreement with the above plans.  All questions were answered to her satisfaction. she is encouraged to contact clinic should she have any questions or concerns prior to her return visit.   Follow up plan: - Return in about 3 months (around 08/23/2018) for Meter, and Logs.  Glade Lloyd, MD [facility] [address] [place], [place] [address] Phone: [phone]  Fax: [phone]    05/23/2018, 2:41 PM  This note was partially dictated with voice recognition software. Similar sounding words can be transcribed inadequately or may not  be corrected upon review.

## 2018-05-23 NOTE — Patient Instructions (Signed)

## 2018-06-03 ENCOUNTER — Other Ambulatory Visit: Payer: Self-pay | Admitting: "Endocrinology

## 2018-06-24 ENCOUNTER — Other Ambulatory Visit: Payer: Self-pay

## 2018-06-24 ENCOUNTER — Encounter: Payer: 59 | Attending: "Endocrinology | Admitting: Nutrition

## 2018-06-24 ENCOUNTER — Encounter: Payer: Self-pay | Admitting: Nutrition

## 2018-06-24 ENCOUNTER — Telehealth: Payer: Self-pay | Admitting: Nutrition

## 2018-06-24 VITALS — Ht 62.0 in | Wt 192.0 lb

## 2018-06-24 DIAGNOSIS — E1165 Type 2 diabetes mellitus with hyperglycemia: Secondary | ICD-10-CM | POA: Insufficient documentation

## 2018-06-24 DIAGNOSIS — E669 Obesity, unspecified: Secondary | ICD-10-CM

## 2018-06-24 DIAGNOSIS — E118 Type 2 diabetes mellitus with unspecified complications: Secondary | ICD-10-CM

## 2018-06-24 DIAGNOSIS — IMO0002 Reserved for concepts with insufficient information to code with codable children: Secondary | ICD-10-CM

## 2018-06-24 MED ORDER — INSULIN LISPRO (1 UNIT DIAL) 100 UNIT/ML (KWIKPEN): 18.0000 [IU] | PEN_INJECTOR | Freq: Three times a day (TID) | SUBCUTANEOUS | 2 refills | Status: DC

## 2018-06-24 NOTE — Progress Notes (Signed)
Medical Nutrition Therapy:  Appt start time: 1500 end time:  1600.   Assessment:  Primary concerns today: Diabetes Type 2..  Had DKA on July 27 th 2019 while on Jardiance  and was taken to [facility] from [facility]. She has lost about 30-40 lbs since then. Works [employer]. 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 Going through a divorce. Her mother had a stoke a few days ago and has been in the hospital. She has been stressed a lot lately.   Eating 1 meal per day. Her mother is in the hospital from a CVA.  Physical activity ADL.  PCP Angelina Ok, FNP, [facility]. Tresiba 50 units at night. 18 units plus sliding scale of Humalog with meals. BS 230 mg/dl this am and this after noon it was 405 mg/dl. 7 day avg 252 mg/dl, 14 215 mg/dl and 30 day 252 mg/dl. BS have been poorly controlled. She didn't  Know what to eat. Has had a poor appetite. Is now seeing Dr. Dorris Fetch, Endocrinology. She doesn't have an appetite. Is feeling depressed with all her stress right now. She is motivated and engaged to eating meals more consistently and working on taking better care of herself.    She would like to take the insulin pen instead of vial and syringes for her Humalog.  Lab Results  Component Value Date   HGBA1C 10.1 (H) 05/16/2018   CMP Latest Ref Rng & Units 05/16/2018 02/07/2017  Glucose 65 - 99 mg/dL 148(H) 156(H)  BUN 7 - 25 mg/dL 11 11  Creatinine 0.50 - 1.10 mg/dL 0.54 0.60  Sodium 135 - 146 mmol/L 138 138  Potassium 3.5 - 5.3 mmol/L 4.3 3.8  Chloride 98 - 110 mmol/L 103 105  CO2 20 - 32 mmol/L 27 24  Calcium 8.6 - 10.2 mg/dL 9.8 9.5  Total Protein 6.1 - 8.1 g/dL 7.2 7.9  Total Bilirubin 0.2 - 1.2 mg/dL 0.4 0.8  Alkaline Phos 38 - 126 U/L - 100  AST 10 - 35 U/L 14 19  ALT 6 - 29 U/L 11 16    Preferred Learning Style: ADL   No preference indicated   Learning Readiness:    Change in progress   MEDICATIONS:  DIETARY INTAKE:  24-hr recall:  B ( AM): Speical K  cereal or Smart Start Snk ( AM): L ( PM): CHicken, rice, green beans, water Snk ( PM):  D ( PM): [place] food, water Snk ( PM):  Beverages: water  Usual physical activity: ADL  Estimated energy needs: 1200 calories 135 g carbohydrates 90 g protein 33 g fat  Progress Towards Goal(s):  In progress.   Nutritional Diagnosis:  NB-1.1 Food and nutrition-related knowledge deficit As related to Diabetes Type 2.  As evidenced by A1C 10.1%..    Intervention: Nutrition and Diabetes education provided on My Plate, CHO counting, meal planning, portion sizes, timing of meals, avoiding snacks between meals unless having a low blood sugar, target ranges for A1C and blood sugars, signs/symptoms and treatment of hyper/hypoglycemia, monitoring blood sugars, taking medications as prescribed, benefits of exercising 30 minutes per day and prevention of complications of DM.  Goals 1 Follow My Plate 2. Eat three meals per day on time 3. Increase fresh fruits and vegetables. 4. Drink only water Get A1C down to 7%. Teaching Method Utilized:  Visual Auditory Hands on  Handouts given during visit include:  The Plate Method    Meal Plan Card   Barriers to learning/adherence  to lifestyle change: none  Demonstrated degree of understanding via:  Teach Back   Monitoring/Evaluation:  Dietary intake, exercise, meal planing , and body weight in 2 month(s). She would benefit from being treated to possible depression and seeing a therapist.

## 2018-06-24 NOTE — Patient Instructions (Signed)
Goals 1 Follow My Plate 2. Eat three meals per day on time 3. Increase fresh fruits and vegetables. 4. Drink only water Get A1C down to 7%.

## 2018-06-25 NOTE — Telephone Encounter (Signed)
TC to patient that her Cholecalciferol has been prescribed and ready to be picked up at her pharmacy per Dr. Dorris Fetch. She stated she will go pick it up and start taking it.

## 2018-08-07 ENCOUNTER — Other Ambulatory Visit: Payer: Self-pay | Admitting: "Endocrinology

## 2018-08-09 LAB — COMPLETE METABOLIC PANEL WITH GFR
AG RATIO: 1.5 (calc) (ref 1.0–2.5)
ALKALINE PHOSPHATASE (APISO): 103 U/L (ref 33–115)
ALT: 10 U/L (ref 6–29)
AST: 14 U/L (ref 10–35)
Albumin: 4.1 g/dL (ref 3.6–5.1)
BUN/Creatinine Ratio: 25 (calc) — ABNORMAL HIGH (ref 6–22)
BUN: 11 mg/dL (ref 7–25)
CO2: 30 mmol/L (ref 20–32)
Calcium: 9.9 mg/dL (ref 8.6–10.2)
Chloride: 104 mmol/L (ref 98–110)
Creat: 0.44 mg/dL — ABNORMAL LOW (ref 0.50–1.10)
GFR, Est African American: 137 mL/min/{1.73_m2} (ref >=60)
GFR, Est Non African American: 119 mL/min/{1.73_m2} (ref >=60)
GLOBULIN: 2.7 g/dL (ref 1.9–3.7)
Glucose, Bld: 124 mg/dL — ABNORMAL HIGH (ref 65–99)
POTASSIUM: 4.8 mmol/L (ref 3.5–5.3)
SODIUM: 140 mmol/L (ref 135–146)
Total Bilirubin: 0.5 mg/dL (ref 0.2–1.2)
Total Protein: 6.8 g/dL (ref 6.1–8.1)

## 2018-08-09 LAB — LIPID PANEL
Cholesterol: 160 mg/dL (ref <200)
HDL: 63 mg/dL (ref >50)
LDL CHOLESTEROL (CALC): 82 mg/dL
Non-HDL Cholesterol (Calc): 97 mg/dL (calc) (ref <130)
Total CHOL/HDL Ratio: 2.5 (calc) (ref <5.0)
Triglycerides: 73 mg/dL (ref <150)

## 2018-08-09 LAB — HEMOGLOBIN A1C
EAG (MMOL/L): 11.7 (calc)
Hgb A1c MFr Bld: 9 % of total Hgb — ABNORMAL HIGH (ref <5.7)
MEAN PLASMA GLUCOSE: 212 (calc)

## 2018-08-09 LAB — VITAMIN D 25 HYDROXY (VIT D DEFICIENCY, FRACTURES): VIT D 25 HYDROXY: 14 ng/mL — AB (ref 30–100)

## 2018-08-22 ENCOUNTER — Encounter: Payer: Self-pay | Admitting: "Endocrinology

## 2018-08-22 ENCOUNTER — Ambulatory Visit (INDEPENDENT_AMBULATORY_CARE_PROVIDER_SITE_OTHER): Payer: 59 | Admitting: "Endocrinology

## 2018-08-22 ENCOUNTER — Ambulatory Visit: Payer: Self-pay | Admitting: Nutrition

## 2018-08-22 VITALS — BP 116/76 | HR 93 | Ht 62.0 in | Wt 204.0 lb

## 2018-08-22 DIAGNOSIS — E559 Vitamin D deficiency, unspecified: Secondary | ICD-10-CM

## 2018-08-22 DIAGNOSIS — I10 Essential (primary) hypertension: Secondary | ICD-10-CM | POA: Diagnosis not present

## 2018-08-22 DIAGNOSIS — E1165 Type 2 diabetes mellitus with hyperglycemia: Secondary | ICD-10-CM

## 2018-08-22 DIAGNOSIS — E782 Mixed hyperlipidemia: Secondary | ICD-10-CM

## 2018-08-22 MED ORDER — VITAMIN D-3 125 MCG (5000 UT) PO TABS: 5000.0000 [IU] | ORAL_TABLET | ORAL | 6 refills | Status: DC

## 2018-08-22 MED ORDER — INSULIN LISPRO (1 UNIT DIAL) 100 UNIT/ML (KWIKPEN): 20.0000 [IU] | PEN_INJECTOR | Freq: Three times a day (TID) | SUBCUTANEOUS | 2 refills | Status: DC

## 2018-08-22 NOTE — Progress Notes (Signed)
Endocrinology follow-up note       08/22/2018, 3:45 PM   Subjective:    Patient ID: Lindsey Vazquez, female    DOB: 1971-02-02.  Lindsey Vazquez is being seen in follow-up for management of currently uncontrolled symptomatic type 2 diabetes, hyperlipidemia, vitamin D deficiency. PCP:   Renee Rival, NP.   Past Medical History:  Diagnosis Date  . Diabetes mellitus without complication ([place])   . Hyperlipemia    Past Surgical History:  Procedure Laterality Date  . c-section     Social History   Socioeconomic History  . Marital status: Married    Spouse name: Not on file  . Number of children: Not on file  . Years of education: Not on file  . Highest education level: Not on file  Occupational History  . Not on file  Social Needs  . Financial resource strain: Not on file  . Food insecurity:    Worry: Not on file    Inability: Not on file  . Transportation needs:    Medical: Not on file    Non-medical: Not on file  Tobacco Use  . Smoking status: Former Smoker    Last attempt to quit: 07/25/2015    Years since quitting: 3.0  . Smokeless tobacco: Never Used  Substance and Sexual Activity  . Alcohol use: No    Comment: socially  . Drug use: No  . Sexual activity: Yes  Lifestyle  . Physical activity:    Days per week: Not on file    Minutes per session: Not on file  . Stress: Not on file  Relationships  . Social connections:    Talks on phone: Not on file    Gets together: Not on file    Attends religious service: Not on file    Active member of club or organization: Not on file    Attends meetings of clubs or organizations: Not on file    Relationship status: Not on file  Other Topics Concern  . Not on file  Social History Narrative  . Not on file   Outpatient Encounter Medications as of 08/22/2018  Medication Sig  . atorvastatin (LIPITOR) 20 MG tablet Take 0.5 tablets  (10 mg total) by mouth daily.  . Cholecalciferol (VITAMIN D-3) 125 MCG (5000 UT) TABS Take 5,000 Units by mouth once a week.  Marland Kitchen glucose blood test strip 1 each by Other route 4 (four) times daily. Use as instructed 4 x daily. E11.65  . ibuprofen (ADVIL,MOTRIN) 800 MG tablet Take 800 mg by mouth every 8 (eight) hours as needed.  . insulin degludec (TRESIBA) 100 UNIT/ML SOPN FlexTouch Pen Inject 50 Units into the skin at bedtime.  . insulin lispro (HUMALOG) 100 UNIT/ML KwikPen Inject 0.2-0.26 mLs (20-26 Units total) into the skin 3 (three) times daily before meals.  . Insulin Syringe-Needle U-100 (TRUEPLUS INSULIN SYRINGE) 31G X 5/16" 0.5 ML MISC Inject 1 each into the skin 4 (four) times daily.  . metFORMIN (GLUCOPHAGE) 500 MG tablet TAKE (1) TABLET BY MOUTH TWICE A DAY WITH MEALS (BREAKFAST AND SUPPER)  . Multiple Vitamin (MULTIVITAMIN) tablet Take 1  tablet by mouth daily.  Marland Kitchen torsemide (DEMADEX) 20 MG tablet Take 20 mg by mouth daily.  . [DISCONTINUED] cholecalciferol 5000 units TABS Take 1,000 Units by mouth once a week. (Patient not taking: Reported on 06/24/2018)  . [DISCONTINUED] insulin lispro (HUMALOG) 100 UNIT/ML KiwkPen Inject 18-24 Units into the skin 3 (three) times daily before meals.  . [DISCONTINUED] insulin lispro (HUMALOG) 100 UNIT/ML KwikPen Inject 0.18-0.24 mLs (18-24 Units total) into the skin 3 (three) times daily before meals.  . [DISCONTINUED] traZODone (DESYREL) 50 MG tablet Take 50 mg by mouth at bedtime.   No facility-administered encounter medications on file as of 08/22/2018.     ALLERGIES: No Known Allergies  VACCINATION STATUS:  There is no immunization history on file for this patient.  Diabetes  She presents for her follow-up diabetic visit. She has type 2 diabetes mellitus. Onset time: Diagnosed at approximate age of 51 years, after episode of gestational diabetes. Her disease course has been improving. There are no hypoglycemic associated symptoms. Pertinent  negatives for hypoglycemia include no confusion, headaches, pallor or seizures. Associated symptoms include fatigue, foot paresthesias, polydipsia and polyphagia. Pertinent negatives for diabetes include no chest pain and no polyuria. There are no hypoglycemic complications. Symptoms are improving (She was recently hospitalized for DKA.). Diabetic complications include nephropathy. (Diabetes ketoacidosis.  She had acute renal failure which required brief.  Of hemodialysis during her hospitalization.) Risk factors for coronary artery disease include diabetes mellitus, dyslipidemia, hypertension, family history, obesity, sedentary lifestyle and tobacco exposure. Current diabetic treatment includes insulin injections. Her weight is increasing steadily. She is following a generally unhealthy diet. When asked about meal planning, she reported none. She rarely participates in exercise. Her home blood glucose trend is decreasing steadily. Her breakfast blood glucose range is generally 180-200 mg/dl. Her lunch blood glucose range is generally >200 mg/dl. Her dinner blood glucose range is generally >200 mg/dl. Her bedtime blood glucose range is generally >200 mg/dl. Her overall blood glucose range is >200 mg/dl. An ACE inhibitor/angiotensin II receptor blocker is not being taken.  Hyperlipidemia  This is a chronic problem. The current episode started more than 1 year ago. The problem is uncontrolled. Exacerbating diseases include diabetes and obesity. Pertinent negatives include no chest pain, myalgias or shortness of breath. Current antihyperlipidemic treatment includes statins. Risk factors for coronary artery disease include dyslipidemia, diabetes mellitus, hypertension, family history, obesity and a sedentary lifestyle.  Hypertension  This is a chronic problem. The current episode started more than 1 year ago. Pertinent negatives include no chest pain, headaches, palpitations or shortness of breath. Risk factors for  coronary artery disease include diabetes mellitus, dyslipidemia, sedentary lifestyle, smoking/tobacco exposure and obesity.     Review of Systems  Constitutional: Positive for fatigue. Negative for chills, fever and unexpected weight change.  HENT: Negative for trouble swallowing and voice change.   Eyes: Negative for visual disturbance.  Respiratory: Negative for cough, shortness of breath and wheezing.   Cardiovascular: Negative for chest pain, palpitations and leg swelling.  Gastrointestinal: Negative for diarrhea, nausea and vomiting.  Endocrine: Positive for polydipsia and polyphagia. Negative for cold intolerance, heat intolerance and polyuria.  Musculoskeletal: Negative for arthralgias and myalgias.  Skin: Negative for color change, pallor, rash and wound.  Neurological: Negative for seizures and headaches.  Psychiatric/Behavioral: Negative for confusion and suicidal ideas.    Objective:    BP 116/76   Pulse 93   Ht 5\' 2"  (1.575 m)   Wt 204 lb (92.5 kg)  BMI 37.31 kg/m   Wt Readings from Last 3 Encounters:  08/22/18 204 lb (92.5 kg)  06/24/18 192 lb (87.1 kg)  05/23/18 205 lb (93 kg)     Physical Exam Constitutional:      Appearance: She is well-developed.  HENT:     Head: Normocephalic and atraumatic.  Neck:     Musculoskeletal: Normal range of motion and neck supple.     Thyroid: No thyromegaly.     Trachea: No tracheal deviation.  Cardiovascular:     Rate and Rhythm: Normal rate and regular rhythm.  Pulmonary:     Effort: Pulmonary effort is normal.     Breath sounds: Normal breath sounds.  Abdominal:     General: Bowel sounds are normal.     Palpations: Abdomen is soft.     Tenderness: There is no abdominal tenderness. There is no guarding.  Musculoskeletal: Normal range of motion.  Skin:    General: Skin is warm and dry.     Coloration: Skin is not pale.     Findings: No erythema or rash.  Neurological:     Mental Status: She is alert and oriented  to person, place, and time.     Cranial Nerves: No cranial nerve deficit.     Coordination: Coordination normal.     Deep Tendon Reflexes: Reflexes are normal and symmetric.  Psychiatric:        Judgment: Judgment normal.      CMP ( most recent) CMP     Component Value Date/Time   NA 140 08/08/2018 0715   K 4.8 08/08/2018 0715   CL 104 08/08/2018 0715   CO2 30 08/08/2018 0715   GLUCOSE 124 (H) 08/08/2018 0715   BUN 11 08/08/2018 0715   CREATININE 0.44 (L) 08/08/2018 0715   CALCIUM 9.9 08/08/2018 0715   PROT 6.8 08/08/2018 0715   ALBUMIN 4.3 02/07/2017 1333   AST 14 08/08/2018 0715   ALT 10 08/08/2018 0715   ALKPHOS 100 02/07/2017 1333   BILITOT 0.5 08/08/2018 0715   GFRNONAA 119 08/08/2018 0715   GFRAA 137 08/08/2018 0715   Recent Results (from the past 2160 hour(s))  Hemoglobin A1c     Status: Abnormal   Collection Time: 08/08/18  7:15 AM  Result Value Ref Range   Hgb A1c MFr Bld 9.0 (H) <5.7 % of total Hgb    Comment: For someone without known diabetes, a hemoglobin A1c value of 6.5% or greater indicates that they may have  diabetes and this should be confirmed with a follow-up  test. . For someone with known diabetes, a value <7% indicates  that their diabetes is well controlled and a value  greater than or equal to 7% indicates suboptimal  control. A1c targets should be individualized based on  duration of diabetes, age, comorbid conditions, and  other considerations. . Currently, no consensus exists regarding use of hemoglobin A1c for diagnosis of diabetes for children. .    Mean Plasma Glucose 212 (calc)   eAG (mmol/L) 11.7 (calc)  COMPLETE METABOLIC PANEL WITH GFR     Status: Abnormal   Collection Time: 08/08/18  7:15 AM  Result Value Ref Range   Glucose, Bld 124 (H) 65 - 99 mg/dL    Comment: .            Fasting reference interval . For someone without known diabetes, a glucose value between 100 and 125 mg/dL is consistent with prediabetes and  should be confirmed with a follow-up test. .  BUN 11 7 - 25 mg/dL   Creat 0.44 (L) 0.50 - 1.10 mg/dL   GFR, Est Non African American 119 > OR = 60 mL/min/1.28m2   GFR, Est African American 137 > OR = 60 mL/min/1.30m2   BUN/Creatinine Ratio 25 (H) 6 - 22 (calc)   Sodium 140 135 - 146 mmol/L   Potassium 4.8 3.5 - 5.3 mmol/L   Chloride 104 98 - 110 mmol/L   CO2 30 20 - 32 mmol/L   Calcium 9.9 8.6 - 10.2 mg/dL   Total Protein 6.8 6.1 - 8.1 g/dL   Albumin 4.1 3.6 - 5.1 g/dL   Globulin 2.7 1.9 - 3.7 g/dL (calc)   AG Ratio 1.5 1.0 - 2.5 (calc)   Total Bilirubin 0.5 0.2 - 1.2 mg/dL   Alkaline phosphatase (APISO) 103 33 - 115 U/L   AST 14 10 - 35 U/L   ALT 10 6 - 29 U/L  Lipid panel     Status: None   Collection Time: 08/08/18  7:15 AM  Result Value Ref Range   Cholesterol 160 <200 mg/dL   HDL 63 >50 mg/dL   Triglycerides 73 <150 mg/dL   LDL Cholesterol (Calc) 82 mg/dL (calc)    Comment: Reference range: <100 . Desirable range <100 mg/dL for primary prevention;   <70 mg/dL for patients with CHD or diabetic patients  with > or = 2 CHD risk factors. Marland Kitchen LDL-C is now calculated using the Martin-Hopkins  calculation, which is a validated novel method providing  better accuracy than the Friedewald equation in the  estimation of LDL-C.  Cresenciano Genre et al. Annamaria Helling. 9735;329(92): 2061-2068  (http://education.[facility].com/faq/FAQ164)    Total CHOL/HDL Ratio 2.5 <5.0 (calc)   Non-HDL Cholesterol (Calc) 97 <130 mg/dL (calc)    Comment: For patients with diabetes plus 1 major ASCVD risk  factor, treating to a non-HDL-C goal of <100 mg/dL  (LDL-C of <70 mg/dL) is considered a therapeutic  option.   VITAMIN D 25 Hydroxy (Vit-D Deficiency, Fractures)     Status: Abnormal   Collection Time: 08/08/18  7:15 AM  Result Value Ref Range   Vit D, 25-Hydroxy 14 (L) 30 - 100 ng/mL    Comment: Vitamin D Status         25-OH Vitamin D: . Deficiency:                    <20 ng/mL Insufficiency:              20 - 29 ng/mL Optimal:                 > or = 30 ng/mL . For 25-OH Vitamin D testing on patients on  D2-supplementation and patients for whom quantitation  of D2 and D3 fractions is required, the QuestAssureD(TM) 25-OH VIT D, (D2,D3), LC/MS/MS is recommended: order  code 432-094-3614 (patients >39yrs). . For more information on this test, go to: http://education.[facility].com/faq/FAQ163 (This link is being provided for  informational/educational purposes only.)      Assessment & Plan:   1. Uncontrolled type 2 diabetes mellitus with hyperglycemia ([facility])  - Daniya G Blackwelder has currently uncontrolled symptomatic type 2 DM since 48 years of age. -She returns with significantly improved but still above target glycemic profile.  Her previsit labs show A1c of 9% improving from 10.1%.    Recent labs reviewed, which also shows significant vitamin D deficiency.    Patient was hospitalized on February 16, 2018 for  euglycemic diabetic ketoacidosis  which was associated with acute renal failure requiring brief period of hemodialysis. -her diabetes is complicated by DKA, obesity, and sedentary life and she remains at a high risk for more acute and chronic complications which include CAD, CVA, CKD, retinopathy, and neuropathy. These are all discussed in detail with her.  - I have counseled her on diet management and weight loss, by adopting a carbohydrate restricted/protein rich diet.  - Patient admits there is a room for improvement in her diet and drink choices. -  Suggestion is made for her to avoid simple carbohydrates  from her diet including Cakes, Sweet Desserts / Pastries, Ice Cream, Soda (diet and regular), Sweet Tea, Candies, Chips, Cookies, Store Bought Juices, Alcohol in Excess of  1-2 drinks a day, Artificial Sweeteners, and "Sugar-free" Products. This will help patient to have stable blood glucose profile and potentially avoid unintended weight gain.  - I encouraged her  to switch to  unprocessed or minimally processed complex starch and increased protein intake (animal or plant source), fruits, and vegetables.  - she is advised to stick to a routine mealtimes to eat 3 meals  a day and avoid unnecessary snacks ( to snack only to correct hypoglycemia).   - I have approached her with the following individualized plan to manage diabetes and patient agrees:   -Based on her current glycemic burden, recent euglycemic DKA, I discussed with her the need for continued intensive treatment with basal/bolus insulin treatment in order for her to achieve and maintain control of diabetes to target.   -She is advised to continue Tresiba 50 units daily at bedtime, increase Humalog to 20 units 3 times a day with meals  for pre-meal BG readings of 90-150mg /dl, plus patient specific correction dose for unexpected hyperglycemia above 150mg /dl, associated with strict monitoring of glucose 4 times a day-before meals and at bedtime. - she is warned not to take insulin without proper monitoring per orders. -Adjustment parameters are given for hypo and hyperglycemia in writing. - she is encouraged to call clinic for blood glucose levels less than 70 or above 300 mg /dl.   -Advised to stay away from SGLT2 inhibitors given her recent history of euglycemic DKA while taking Jardiance.  -Her renal function is normal, she will continue to benefit from metformin therapy.  She is advised to continue metformin 500 mg p.o. twice daily after breakfast and supper.     - she will be considered for incretin therapy as appropriate next visit. - Patient specific target  A1c;  LDL, HDL, Triglycerides, and  Waist Circumference were discussed in detail.  2) BP/HTN:  her blood pressure is controlled to target.   she is currently not taking any blood pressure medications.    3) Lipids/HPL: Her recent lipid panel show controlled LDL at 82.  She is advised to continue atorvastatin 10 mg p.o. nightly.  Side  effects and precautions discussed with her.  4)  Weight/Diet:  Body mass index is 37.31 kg/m.  - clearly complicating her diabetes care.  I discussed with her the fact that loss of 5 - 10% of her  current body weight will have the most impact on her diabetes management.  CDE Consult will be initiated . Exercise, and detailed carbohydrates information provided  -  detailed on discharge instructions.  5) D deficiency- 25 hydroxy vitamin D remains low despite a course of treatment with vitamin D3 5000 units for 90 days .she is approached for retreatment with vitamin D3 5000 units daily for  the next 90 days.     6) Chronic Care/Health Maintenance:  -she  is on Statin medications and  is encouraged to initiate and continue to follow up with Ophthalmology, Dentist,  Podiatrist at least yearly or according to recommendations, and advised to   stay away from smoking. I have recommended yearly flu vaccine and pneumonia vaccine at least every 5 years; moderate intensity exercise for up to 150 minutes weekly; and  sleep for at least 7 hours a day.  - I advised patient to maintain close follow up with Renee Rival, NP for primary care needs.  - Time spent with the patient: 25 min, of which >50% was spent in reviewing her blood glucose logs , discussing her hypoglycemia and hyperglycemia episodes, reviewing her current and  previous labs / studies and medications  doses and developing a plan to avoid hypoglycemia and hyperglycemia. Please refer to Patient Instructions for Blood Glucose Monitoring and Insulin/Medications Dosing Guide"  in media tab for additional information. Lindsey Vazquez participated in the discussions, expressed understanding, and voiced agreement with the above plans.  All questions were answered to her satisfaction. she is encouraged to contact clinic should she have any questions or concerns prior to her return visit.  Follow up plan: - Return in about 3 months (around  11/21/2018) for Follow up with Pre-visit Labs, Meter, and Logs.  Glade Lloyd, MD [facility] [address] [place], [place] [address] Phone: [phone]  Fax: [phone]    08/22/2018, 3:45 PM  This note was partially dictated with voice recognition software. Similar sounding words can be transcribed inadequately or may not  be corrected upon review.

## 2018-08-22 NOTE — Patient Instructions (Signed)

## 2018-11-07 LAB — COMPLETE METABOLIC PANEL WITH GFR
AG Ratio: 1.6 (calc) (ref 1.0–2.5)
ALBUMIN MSPROF: 4.4 g/dL (ref 3.6–5.1)
ALT: 12 U/L (ref 6–29)
AST: 15 U/L (ref 10–35)
Alkaline phosphatase (APISO): 119 U/L (ref 31–125)
BUN: 17 mg/dL (ref 7–25)
CO2: 31 mmol/L (ref 20–32)
CREATININE: 0.6 mg/dL (ref 0.50–1.10)
Calcium: 9.8 mg/dL (ref 8.6–10.2)
Chloride: 99 mmol/L (ref 98–110)
GFR, EST AFRICAN AMERICAN: 124 mL/min/{1.73_m2} (ref >=60)
GFR, Est Non African American: 107 mL/min/{1.73_m2} (ref >=60)
GLOBULIN: 2.8 g/dL (ref 1.9–3.7)
GLUCOSE: 129 mg/dL (ref 65–139)
Potassium: 4.5 mmol/L (ref 3.5–5.3)
Sodium: 138 mmol/L (ref 135–146)
TOTAL PROTEIN: 7.2 g/dL (ref 6.1–8.1)
Total Bilirubin: 0.4 mg/dL (ref 0.2–1.2)

## 2018-11-07 LAB — HEMOGLOBIN A1C
Hgb A1c MFr Bld: 10.1 % of total Hgb — ABNORMAL HIGH (ref <5.7)
Mean Plasma Glucose: 243 (calc)
eAG (mmol/L): 13.5 (calc)

## 2018-11-21 ENCOUNTER — Other Ambulatory Visit: Payer: Self-pay

## 2018-11-21 ENCOUNTER — Ambulatory Visit: Payer: 59 | Admitting: Nutrition

## 2018-11-21 ENCOUNTER — Ambulatory Visit (INDEPENDENT_AMBULATORY_CARE_PROVIDER_SITE_OTHER): Payer: 59 | Admitting: "Endocrinology

## 2018-11-21 ENCOUNTER — Encounter: Payer: Self-pay | Admitting: "Endocrinology

## 2018-11-21 DIAGNOSIS — I10 Essential (primary) hypertension: Secondary | ICD-10-CM | POA: Diagnosis not present

## 2018-11-21 DIAGNOSIS — E559 Vitamin D deficiency, unspecified: Secondary | ICD-10-CM | POA: Diagnosis not present

## 2018-11-21 DIAGNOSIS — E1165 Type 2 diabetes mellitus with hyperglycemia: Secondary | ICD-10-CM | POA: Diagnosis not present

## 2018-11-21 DIAGNOSIS — E782 Mixed hyperlipidemia: Secondary | ICD-10-CM | POA: Diagnosis not present

## 2018-11-21 MED ORDER — INSULIN DEGLUDEC 100 UNIT/ML ~~LOC~~ SOPN: 60.0000 [IU] | PEN_INJECTOR | Freq: Every day | SUBCUTANEOUS | 2 refills | Status: DC

## 2018-11-21 MED ORDER — METFORMIN HCL 500 MG PO TABS: ORAL_TABLET | ORAL | 2 refills | Status: DC

## 2018-11-21 NOTE — Progress Notes (Signed)
11/21/2018, 2:15 PM                                Endocrinology Telehealth via WebEx visit Follow up Note -During COVID -19 Pandemic  I connected with Lindsey Vazquez on 11/21/2018   by telephone and verified that I am speaking with the correct person using two identifiers. Lindsey Vazquez, 1971-04-21. she has verbally consented to this visit. All issues noted in this document were discussed and addressed. The format was not optimal for physical exam.    Subjective:    Patient ID: Lindsey Vazquez, female    DOB: 06-13-1971.  Lindsey Vazquez is being engaged in telehealth via WebEx in follow-up for management of currently uncontrolled symptomatic type 2 diabetes, hyperlipidemia, vitamin D deficiency. PCP:   Renee Rival, NP.   Past Medical History:  Diagnosis Date  . Diabetes mellitus without complication ([place])   . Hyperlipemia    Past Surgical History:  Procedure Laterality Date  . c-section     Social History   Socioeconomic History  . Marital status: Married    Spouse name: Not on file  . Number of children: Not on file  . Years of education: Not on file  . Highest education level: Not on file  Occupational History  . Not on file  Social Needs  . Financial resource strain: Not on file  . Food insecurity:    Worry: Not on file    Inability: Not on file  . Transportation needs:    Medical: Not on file    Non-medical: Not on file  Tobacco Use  . Smoking status: Former Smoker    Last attempt to quit: 07/25/2015    Years since quitting: 3.3  . Smokeless tobacco: Never Used  Substance and Sexual Activity  . Alcohol use: No    Comment: socially  . Drug use: No  . Sexual activity: Yes  Lifestyle  . Physical activity:    Days per week: Not on file    Minutes per session: Not on file  . Stress: Not on file  Relationships  . Social connections:    Talks on phone: Not on file    Gets together: Not on file    Attends  religious service: Not on file    Active member of club or organization: Not on file    Attends meetings of clubs or organizations: Not on file    Relationship status: Not on file  Other Topics Concern  . Not on file  Social History Narrative  . Not on file   Outpatient Encounter Medications as of 11/21/2018  Medication Sig  . atorvastatin (LIPITOR) 20 MG tablet Take 0.5 tablets (10 mg total) by mouth daily.  . Cholecalciferol (VITAMIN D-3) 125 MCG (5000 UT) TABS Take 5,000 Units by mouth once a week.  Marland Kitchen glucose blood test strip 1 each by Other route 4 (four) times daily. Use as instructed 4 x daily. E11.65  . ibuprofen (ADVIL,MOTRIN) 800 MG tablet Take 800 mg by mouth every 8 (eight) hours as needed.  . insulin degludec (TRESIBA) 100 UNIT/ML SOPN FlexTouch Pen Inject 0.6 mLs (60 Units total) into the skin at bedtime.  . insulin lispro (HUMALOG) 100 UNIT/ML KwikPen Inject 0.2-0.26 mLs (20-26 Units total) into the skin 3 (three) times daily before meals.  . Insulin Syringe-Needle  U-100 (TRUEPLUS INSULIN SYRINGE) 31G X 5/16" 0.5 ML MISC Inject 1 each into the skin 4 (four) times daily.  . metFORMIN (GLUCOPHAGE) 500 MG tablet TAKE (1) TABLET BY MOUTH TWICE A DAY WITH MEALS (BREAKFAST AND SUPPER)  . Multiple Vitamin (MULTIVITAMIN) tablet Take 1 tablet by mouth daily.  Marland Kitchen torsemide (DEMADEX) 20 MG tablet Take 20 mg by mouth daily.  . [DISCONTINUED] insulin degludec (TRESIBA) 100 UNIT/ML SOPN FlexTouch Pen Inject 60 Units into the skin at bedtime.  . [DISCONTINUED] metFORMIN (GLUCOPHAGE) 500 MG tablet TAKE (1) TABLET BY MOUTH TWICE A DAY WITH MEALS (BREAKFAST AND SUPPER)   No facility-administered encounter medications on file as of 11/21/2018.     ALLERGIES: No Known Allergies  VACCINATION STATUS:  There is no immunization history on file for this patient.  Diabetes  She presents for her follow-up diabetic visit. She has type 2 diabetes mellitus. Onset time: Diagnosed at approximate age of  35 years, after episode of gestational diabetes. Her disease course has been worsening. There are no hypoglycemic associated symptoms. Pertinent negatives for hypoglycemia include no confusion, headaches, pallor or seizures. Associated symptoms include fatigue, foot paresthesias, polydipsia and polyphagia. Pertinent negatives for diabetes include no chest pain and no polyuria. There are no hypoglycemic complications. Symptoms are worsening (She was recently hospitalized for DKA.). Diabetic complications include nephropathy. (Diabetes ketoacidosis.  She had acute renal failure which required brief.  Of hemodialysis during her hospitalization.) Risk factors for coronary artery disease include diabetes mellitus, dyslipidemia, hypertension, family history, obesity, sedentary lifestyle and tobacco exposure. Current diabetic treatment includes insulin injections. Her weight is increasing steadily. She is following a generally unhealthy diet. When asked about meal planning, she reported none. She rarely participates in exercise. Her home blood glucose trend is decreasing steadily. Her breakfast blood glucose range is generally 180-200 mg/dl. Her lunch blood glucose range is generally 180-200 mg/dl. Her dinner blood glucose range is generally 180-200 mg/dl. Her bedtime blood glucose range is generally 180-200 mg/dl. Her overall blood glucose range is 180-200 mg/dl. An ACE inhibitor/angiotensin II receptor blocker is not being taken.  Hyperlipidemia  This is a chronic problem. The current episode started more than 1 year ago. The problem is uncontrolled. Exacerbating diseases include diabetes and obesity. Pertinent negatives include no chest pain, myalgias or shortness of breath. Current antihyperlipidemic treatment includes statins. Risk factors for coronary artery disease include dyslipidemia, diabetes mellitus, hypertension, family history, obesity and a sedentary lifestyle.  Hypertension  This is a chronic problem.  The current episode started more than 1 year ago. Pertinent negatives include no chest pain, headaches, palpitations or shortness of breath. Risk factors for coronary artery disease include diabetes mellitus, dyslipidemia, sedentary lifestyle, smoking/tobacco exposure and obesity.   Review of systems: Limited as above   Objective:    There were no vitals taken for this visit.  Wt Readings from Last 3 Encounters:  08/22/18 204 lb (92.5 kg)  06/24/18 192 lb (87.1 kg)  05/23/18 205 lb (93 kg)    CMP ( most recent) CMP     Component Value Date/Time   NA 138 11/06/2018 0800   K 4.5 11/06/2018 0800   CL 99 11/06/2018 0800   CO2 31 11/06/2018 0800   GLUCOSE 129 11/06/2018 0800   BUN 17 11/06/2018 0800   CREATININE 0.60 11/06/2018 0800   CALCIUM 9.8 11/06/2018 0800   PROT 7.2 11/06/2018 0800   ALBUMIN 4.3 02/07/2017 1333   AST 15 11/06/2018 0800   ALT 12 11/06/2018 0800  ALKPHOS 100 02/07/2017 1333   BILITOT 0.4 11/06/2018 0800   GFRNONAA 107 11/06/2018 0800   GFRAA 124 11/06/2018 0800   Recent Results (from the past 2160 hour(s))  COMPLETE METABOLIC PANEL WITH GFR     Status: None   Collection Time: 11/06/18  8:00 AM  Result Value Ref Range   Glucose, Bld 129 65 - 139 mg/dL    Comment: .        Non-fasting reference interval .    BUN 17 7 - 25 mg/dL   Creat 0.60 0.50 - 1.10 mg/dL   GFR, Est Non African American 107 > OR = 60 mL/min/1.18m2   GFR, Est African American 124 > OR = 60 mL/min/1.59m2   BUN/Creatinine Ratio NOT APPLICABLE 6 - 22 (calc)   Sodium 138 135 - 146 mmol/L   Potassium 4.5 3.5 - 5.3 mmol/L   Chloride 99 98 - 110 mmol/L   CO2 31 20 - 32 mmol/L   Calcium 9.8 8.6 - 10.2 mg/dL   Total Protein 7.2 6.1 - 8.1 g/dL   Albumin 4.4 3.6 - 5.1 g/dL   Globulin 2.8 1.9 - 3.7 g/dL (calc)   AG Ratio 1.6 1.0 - 2.5 (calc)   Total Bilirubin 0.4 0.2 - 1.2 mg/dL   Alkaline phosphatase (APISO) 119 31 - 125 U/L   AST 15 10 - 35 U/L   ALT 12 6 - 29 U/L  Hemoglobin A1c      Status: Abnormal   Collection Time: 11/06/18  8:00 AM  Result Value Ref Range   Hgb A1c MFr Bld 10.1 (H) <5.7 % of total Hgb    Comment: For someone without known diabetes, a hemoglobin A1c value of 6.5% or greater indicates that they may have  diabetes and this should be confirmed with a follow-up  test. . For someone with known diabetes, a value <7% indicates  that their diabetes is well controlled and a value  greater than or equal to 7% indicates suboptimal  control. A1c targets should be individualized based on  duration of diabetes, age, comorbid conditions, and  other considerations. . Currently, no consensus exists regarding use of hemoglobin A1c for diagnosis of diabetes for children. .    Mean Plasma Glucose 243 (calc)   eAG (mmol/L) 13.5 (calc)     Assessment & Plan:   1. Uncontrolled type 2 diabetes mellitus with hyperglycemia ([facility])  - Sarina G Sedor has currently uncontrolled symptomatic type 2 DM since 48 years of age. -She ports significantly above target glycemic profile, prebreakfast between 171-267, prelunch between 44-243, presupper between 74-3 07, bedtime between 54-1 80 .  -Her previsit labs show A1c of 10.1% increasing from 9%.      Recent labs reviewed, which also shows significant vitamin D deficiency.    Patient was hospitalized on February 16, 2018 for  euglycemic diabetic ketoacidosis which was associated with acute renal failure requiring brief period of hemodialysis. -her diabetes is complicated by DKA, obesity, and sedentary life and she remains at a high risk for more acute and chronic complications which include CAD, CVA, CKD, retinopathy, and neuropathy. These are all discussed in detail with her.  - I have counseled her on diet management and weight loss, by adopting a carbohydrate restricted/protein rich diet.  - Patient admits there is a room for improvement in her diet and drink choices. -  Suggestion is made for her to avoid simple  carbohydrates  from her diet including Cakes, Sweet Desserts / Pastries, Ice  Cream, Soda (diet and regular), Sweet Tea, Candies, Chips, Cookies, Store Bought Juices, Alcohol in Excess of  1-2 drinks a day, Artificial Sweeteners, and "Sugar-free" Products. This will help patient to have stable blood glucose profile and potentially avoid unintended weight gain.   - I encouraged her to switch to  unprocessed or minimally processed complex starch and increased protein intake (animal or plant source), fruits, and vegetables.  - she is advised to stick to a routine mealtimes to eat 3 meals  a day and avoid unnecessary snacks ( to snack only to correct hypoglycemia).   - I have approached her with the following individualized plan to manage diabetes and patient agrees:   -Based on her current glycemic burden, recent euglycemic DKA, I discussed with her the need for continued intensive treatment with basal/bolus insulin treatment in order for her to achieve and maintain control of diabetes to target.   -She will need a higher dose of insulin.  She is advised to increase her Tresiba to 60 units nightly, continue Humalog 20 units 3 times a day with meals  for pre-meal BG readings of 90-150mg /dl, plus patient specific correction dose for unexpected hyperglycemia above 150mg /dl, associated with strict monitoring of glucose 4 times a day-before meals and at bedtime. - she is warned not to take insulin without proper monitoring per orders. -Adjustment parameters are given for hypo and hyperglycemia in writing. - she is encouraged to call clinic for blood glucose levels less than 70 or above 300 mg /dl.   -Advised to stay away from SGLT2 inhibitors given her recent history of euglycemic DKA while taking Jardiance.  -Her renal function is normal, she will continue to benefit from metformin therapy.  She is advised to continue metformin 500 mg p.o. twice daily-after breakfast and after supper.    - she will be  considered for incretin therapy as appropriate next visit. - Patient specific target  A1c;  LDL, HDL, Triglycerides, and  Waist Circumference were discussed in detail.  2) BP/HTN: she is advised to home monitor blood pressure and report if > 140/90 on 2 separate readings.    she is currently not taking any blood pressure medications.    3) Lipids/HPL: Her recent lipid panel show controlled LDL at 82.  She is advised to continue atorvastatin 10 mg p.o. nightly.  Side effects and precautions discussed with her.  4) vitamin D D deficiency- 25 hydroxy vitamin D remains low despite a course of treatment with vitamin D3 5000 units for 90 days .she is approached for retreatment with vitamin D3 5000 units daily for the next 90 days.     5) Chronic Care/Health Maintenance:  -she  is on Statin medications and  is encouraged to initiate and continue to follow up with Ophthalmology, Dentist,  Podiatrist at least yearly or according to recommendations, and advised to   stay away from smoking. I have recommended yearly flu vaccine and pneumonia vaccine at least every 5 years; moderate intensity exercise for up to 150 minutes weekly; and  sleep for at least 7 hours a day.  - I advised patient to maintain close follow up with Renee Rival, NP for primary care needs.  - Patient Care Time Today:  25 min, of which >50% was spent in reviewing her  current and  previous labs/studies, previous and current glycemic profile, previous treatments, and medications doses and developing a plan for long-term care based on the latest recommendations for standards of care.  Terrence Dupont  Chauvin participated in the discussions, expressed understanding, and voiced agreement with the above plans.  All questions were answered to her satisfaction. she is encouraged to contact clinic should she have any questions or concerns prior to her return visit.   Follow up plan: - Return in about 3 months (around 02/20/2019) for Meter,  and Logs.  Glade Lloyd, MD [facility] [address] [place], [place] [address] Phone: [phone]  Fax: [phone]    11/21/2018, 2:15 PM  This note was partially dictated with voice recognition software. Similar sounding words can be transcribed inadequately or may not  be corrected upon review.

## 2019-01-13 ENCOUNTER — Other Ambulatory Visit: Payer: Self-pay | Admitting: "Endocrinology

## 2019-02-06 ENCOUNTER — Other Ambulatory Visit: Payer: Self-pay | Admitting: "Endocrinology

## 2019-02-12 ENCOUNTER — Telehealth: Payer: Self-pay | Admitting: "Endocrinology

## 2019-02-12 MED ORDER — INSULIN GLARGINE (1 UNIT DIAL) 300 UNIT/ML ~~LOC~~ SOPN: 60.0000 [IU] | PEN_INJECTOR | Freq: Every day | SUBCUTANEOUS | 2 refills | Status: DC

## 2019-02-12 NOTE — Telephone Encounter (Signed)
Toujeo sent in. Will see if pharmacy sends something back with any issues.

## 2019-02-12 NOTE — Telephone Encounter (Signed)
Patient left VM that insurance no longer covers insulin degludec (TRESIBA) 100 UNIT/ML SOPN FlexTouch Pen. She is asking for something else.

## 2019-02-13 LAB — COMPLETE METABOLIC PANEL WITH GFR
AG Ratio: 1.5 (calc) (ref 1.0–2.5)
ALT: 10 U/L (ref 6–29)
AST: 13 U/L (ref 10–35)
Albumin: 4 g/dL (ref 3.6–5.1)
Alkaline phosphatase (APISO): 122 U/L (ref 37–153)
BUN/Creatinine Ratio: 17 (calc) (ref 6–22)
BUN: 8 mg/dL (ref 7–25)
CO2: 27 mmol/L (ref 20–32)
Calcium: 9.4 mg/dL (ref 8.6–10.4)
Chloride: 105 mmol/L (ref 98–110)
Creat: 0.47 mg/dL — ABNORMAL LOW (ref 0.50–1.05)
GFR, Est African American: 133 mL/min/{1.73_m2} (ref >=60)
GFR, Est Non African American: 115 mL/min/{1.73_m2} (ref >=60)
Globulin: 2.7 g/dL (calc) (ref 1.9–3.7)
Glucose, Bld: 127 mg/dL — ABNORMAL HIGH (ref 65–99)
Potassium: 4.1 mmol/L (ref 3.5–5.3)
Sodium: 139 mmol/L (ref 135–146)
Total Bilirubin: 0.3 mg/dL (ref 0.2–1.2)
Total Protein: 6.7 g/dL (ref 6.1–8.1)

## 2019-02-13 LAB — HEMOGLOBIN A1C
Hgb A1c MFr Bld: 9 % of total Hgb — ABNORMAL HIGH (ref <5.7)
Mean Plasma Glucose: 212 (calc)
eAG (mmol/L): 11.7 (calc)

## 2019-02-20 ENCOUNTER — Ambulatory Visit (INDEPENDENT_AMBULATORY_CARE_PROVIDER_SITE_OTHER): Payer: 59 | Admitting: "Endocrinology

## 2019-02-20 ENCOUNTER — Encounter: Payer: Self-pay | Admitting: "Endocrinology

## 2019-02-20 DIAGNOSIS — E1165 Type 2 diabetes mellitus with hyperglycemia: Secondary | ICD-10-CM

## 2019-02-20 DIAGNOSIS — I10 Essential (primary) hypertension: Secondary | ICD-10-CM

## 2019-02-20 DIAGNOSIS — E782 Mixed hyperlipidemia: Secondary | ICD-10-CM

## 2019-02-20 NOTE — Progress Notes (Signed)
02/20/2019, 3:36 PM                                Endocrinology Telehealth via WebEx visit Follow up Note -During COVID -19 Pandemic  I connected with Lindsey Vazquez on 02/20/2019   by telephone and verified that I am speaking with the correct person using two identifiers. Lindsey Vazquez, January 13, 1971. (48) she has verbally consented to this visit. All issues noted in this document were discussed and addressed. The format was not optimal for physical exam.    Subjective:    Patient ID: Lindsey Vazquez, female    DOB: 1971/04/07.  Lindsey Vazquez is being engaged in telehealth via WebEx in follow-up for management of currently uncontrolled symptomatic type 2 diabetes, hyperlipidemia, vitamin D deficiency. PCP:   Renee Rival, NP.   Past Medical History:  Diagnosis Date  . Diabetes mellitus without complication ([place])   . Hyperlipemia    Past Surgical History:  Procedure Laterality Date  . c-section     Social History   Socioeconomic History  . Marital status: Married    Spouse name: Not on file  . Number of children: Not on file  . Years of education: Not on file  . Highest education level: Not on file  Occupational History  . Not on file  Social Needs  . Financial resource strain: Not on file  . Food insecurity    Worry: Not on file    Inability: Not on file  . Transportation needs    Medical: Not on file    Non-medical: Not on file  Tobacco Use  . Smoking status: Former Smoker    Quit date: 07/25/2015    Years since quitting: 3.5  . Smokeless tobacco: Never Used  Substance and Sexual Activity  . Alcohol use: No    Comment: socially  . Drug use: No  . Sexual activity: Yes  Lifestyle  . Physical activity    Days per week: Not on file    Minutes per session: Not on file  . Stress: Not on file  Relationships  . Social Herbalist on phone: Not on file    Gets together: Not on file    Attends religious  service: Not on file    Active member of club or organization: Not on file    Attends meetings of clubs or organizations: Not on file    Relationship status: Not on file  Other Topics Concern  . Not on file  Social History Narrative  . Not on file   Outpatient Encounter Medications as of 02/20/2019  Medication Sig  . atorvastatin (LIPITOR) 20 MG tablet Take 0.5 tablets (10 mg total) by mouth daily.  . Cholecalciferol (VITAMIN D-3) 125 MCG (5000 UT) TABS Take 5,000 Units by mouth once a week.  Marland Kitchen glucose blood test strip 1 each by Other route 4 (four) times daily. Use as instructed 4 x daily. E11.65  . HUMALOG KWIKPEN 100 UNIT/ML KwikPen INJECT 20-26 UNITS S.Q. THREE TIMES A DAY BEFORE MEALS AS INSTRUCTED PER SLIDING SCALE.  Marland Kitchen ibuprofen (ADVIL,MOTRIN) 800 MG tablet Take 800 mg by mouth every 8 (eight) hours as needed.  . insulin degludec (TRESIBA) 100 UNIT/ML SOPN FlexTouch Pen Inject 0.6 mLs (60 Units total) into the skin at bedtime.  . Insulin Glargine, 1 Unit Dial,  300 UNIT/ML SOPN Inject 60 Units into the skin at bedtime.  . Insulin Syringe-Needle U-100 (TRUEPLUS INSULIN SYRINGE) 31G X 5/16" 0.5 ML MISC Inject 1 each into the skin 4 (four) times daily.  . metFORMIN (GLUCOPHAGE) 500 MG tablet TAKE (1) TABLET BY MOUTH TWICE A DAY WITH MEALS (BREAKFAST AND SUPPER)  . Multiple Vitamin (MULTIVITAMIN) tablet Take 1 tablet by mouth daily.  Marland Kitchen torsemide (DEMADEX) 20 MG tablet Take 20 mg by mouth daily.   No facility-administered encounter medications on file as of 02/20/2019.     ALLERGIES: No Known Allergies  VACCINATION STATUS:  There is no immunization history on file for this patient.  Diabetes She presents for her follow-up diabetic visit. She has type 2 diabetes mellitus. Onset time: Diagnosed at approximate age of 26 years, after episode of gestational diabetes. Her disease course has been improving. There are no hypoglycemic associated symptoms. Pertinent negatives for hypoglycemia  include no confusion, headaches, pallor or seizures. Associated symptoms include fatigue, foot paresthesias, polydipsia and polyphagia. Pertinent negatives for diabetes include no chest pain and no polyuria. There are no hypoglycemic complications. Symptoms are improving (She was recently hospitalized for DKA.). Diabetic complications include nephropathy. (Diabetes ketoacidosis.  She had acute renal failure which required brief.  Of hemodialysis during her hospitalization.) Risk factors for coronary artery disease include diabetes mellitus, dyslipidemia, hypertension, family history, obesity, sedentary lifestyle and tobacco exposure. Current diabetic treatment includes insulin injections. Her weight is increasing steadily. She is following a generally unhealthy diet. When asked about meal planning, she reported none. She rarely participates in exercise. Her home blood glucose trend is decreasing steadily. Her breakfast blood glucose range is generally 180-200 mg/dl. Her lunch blood glucose range is generally 180-200 mg/dl. Her dinner blood glucose range is generally 180-200 mg/dl. Her bedtime blood glucose range is generally >200 mg/dl. Her overall blood glucose range is 180-200 mg/dl. An ACE inhibitor/angiotensin II receptor blocker is not being taken.  Hyperlipidemia This is a chronic problem. The current episode started more than 1 year ago. The problem is uncontrolled. Exacerbating diseases include diabetes and obesity. Pertinent negatives include no chest pain, myalgias or shortness of breath. Current antihyperlipidemic treatment includes statins. Risk factors for coronary artery disease include dyslipidemia, diabetes mellitus, hypertension, family history, obesity and a sedentary lifestyle.  Hypertension This is a chronic problem. The current episode started more than 1 year ago. Pertinent negatives include no chest pain, headaches, palpitations or shortness of breath. Risk factors for coronary artery  disease include diabetes mellitus, dyslipidemia, sedentary lifestyle, smoking/tobacco exposure and obesity.   Review of systems: Limited as above   Objective:    There were no vitals taken for this visit.  Wt Readings from Last 3 Encounters:  08/22/18 204 lb (92.5 kg)  06/24/18 192 lb (87.1 kg)  05/23/18 205 lb (93 kg)     CMP     Component Value Date/Time   NA 139 02/12/2019 0803   K 4.1 02/12/2019 0803   CL 105 02/12/2019 0803   CO2 27 02/12/2019 0803   GLUCOSE 127 (H) 02/12/2019 0803   BUN 8 02/12/2019 0803   CREATININE 0.47 (L) 02/12/2019 0803   CALCIUM 9.4 02/12/2019 0803   PROT 6.7 02/12/2019 0803   ALBUMIN 4.3 02/07/2017 1333   AST 13 02/12/2019 0803   ALT 10 02/12/2019 0803   ALKPHOS 100 02/07/2017 1333   BILITOT 0.3 02/12/2019 0803   GFRNONAA 115 02/12/2019 0803   GFRAA 133 02/12/2019 0803   Recent Results (from  the past 2160 hour(s))  Hemoglobin A1c     Status: Abnormal   Collection Time: 02/12/19  8:03 AM  Result Value Ref Range   Hgb A1c MFr Bld 9.0 (H) <5.7 % of total Hgb    Comment: For someone without known diabetes, a hemoglobin A1c value of 6.5% or greater indicates that they may have  diabetes and this should be confirmed with a follow-up  test. . For someone with known diabetes, a value <7% indicates  that their diabetes is well controlled and a value  greater than or equal to 7% indicates suboptimal  control. A1c targets should be individualized based on  duration of diabetes, age, comorbid conditions, and  other considerations. . Currently, no consensus exists regarding use of hemoglobin A1c for diagnosis of diabetes for children. .    Mean Plasma Glucose 212 (calc)   eAG (mmol/L) 11.7 (calc)  COMPLETE METABOLIC PANEL WITH GFR     Status: Abnormal   Collection Time: 02/12/19  8:03 AM  Result Value Ref Range   Glucose, Bld 127 (H) 65 - 99 mg/dL    Comment: .            Fasting reference interval . For someone without known diabetes,  a glucose value >125 mg/dL indicates that they may have diabetes and this should be confirmed with a follow-up test. .    BUN 8 7 - 25 mg/dL   Creat 0.47 (L) 0.50 - 1.05 mg/dL    Comment: For patients >23 years of age, the reference limit for Creatinine is approximately 13% higher for people identified as African-American. .    GFR, Est Non African American 115 > OR = 60 mL/min/1.69m2   GFR, Est African American 133 > OR = 60 mL/min/1.3m2   BUN/Creatinine Ratio 17 6 - 22 (calc)   Sodium 139 135 - 146 mmol/L   Potassium 4.1 3.5 - 5.3 mmol/L   Chloride 105 98 - 110 mmol/L   CO2 27 20 - 32 mmol/L   Calcium 9.4 8.6 - 10.4 mg/dL   Total Protein 6.7 6.1 - 8.1 g/dL   Albumin 4.0 3.6 - 5.1 g/dL   Globulin 2.7 1.9 - 3.7 g/dL (calc)   AG Ratio 1.5 1.0 - 2.5 (calc)   Total Bilirubin 0.3 0.2 - 1.2 mg/dL   Alkaline phosphatase (APISO) 122 37 - 153 U/L   AST 13 10 - 35 U/L   ALT 10 6 - 29 U/L     Assessment & Plan:   1. Uncontrolled type 2 diabetes mellitus with hyperglycemia ([facility])  - Kaytie G Sam has currently uncontrolled symptomatic type 2 DM since 48 years of age. -She reports fluctuating glycemic profile, A1c of 9% improving from 10.1%. -Her glycemic profile includes readings in the 50s happening at random.    Recent labs reviewed, which also shows significant vitamin D deficiency.    Patient was hospitalized on February 16, 2018 for  euglycemic diabetic ketoacidosis which was associated with acute renal failure requiring brief period of hemodialysis. -her diabetes is complicated by DKA, obesity, and sedentary life and she remains at a high risk for more acute and chronic complications which include CAD, CVA, CKD, retinopathy, and neuropathy. These are all discussed in detail with her.  - I have counseled her on diet management and weight loss, by adopting a carbohydrate restricted/protein rich diet. - she  admits there is a room for improvement in her diet and drink  choices. -  Suggestion is made  for her to avoid simple carbohydrates  from her diet including Cakes, Sweet Desserts / Pastries, Ice Cream, Soda (diet and regular), Sweet Tea, Candies, Chips, Cookies, Sweet Pastries,  Store Bought Juices, Alcohol in Excess of  1-2 drinks a day, Artificial Sweeteners, Coffee Creamer, and "Sugar-free" Products. This will help patient to have stable blood glucose profile and potentially avoid unintended weight gain.   - I encouraged her to switch to  unprocessed or minimally processed complex starch and increased protein intake (animal or plant source), fruits, and vegetables.  - she is advised to stick to a routine mealtimes to eat 3 meals  a day and avoid unnecessary snacks ( to snack only to correct hypoglycemia).   - I have approached her with the following individualized plan to manage diabetes and patient agrees:   -Based on her current glycemic burden, recent euglycemic DKA, I discussed with her the need for continued intensive treatment with basal/bolus insulin treatment in order for her to achieve and maintain control of diabetes to target.   -She will need a higher dose of insulin, her unstable glycemic profile makes it difficult to optimize her insulin treatment. -She is advised to continue Toujeo 60 units nightly, continue Humalog 20 units 3 times a day with meals  for pre-meal BG readings of 90-150mg /dl, plus patient specific correction dose for unexpected hyperglycemia above 150mg /dl, associated with strict monitoring of glucose 4 times a day-before meals and at bedtime. - she is warned not to take insulin without proper monitoring per orders. -Adjustment parameters are given for hypo and hyperglycemia in writing. - she is encouraged to call clinic for blood glucose levels less than 70 or above 300 mg /dl.   -Advised to stay away from SGLT2 inhibitors given her recent history of euglycemic DKA while taking Jardiance.  -Her renal function is normal, she  will continue to benefit from metformin therapy.  She is advised to continue metformin 500 mg p.o. twice daily-after breakfast and after supper.      - she will be considered for incretin therapy as appropriate next visit. - Patient specific target  A1c;  LDL, HDL, Triglycerides, and  Waist Circumference were discussed in detail.  2) BP/HTN:  she is advised to home monitor blood pressure and report if > 140/90 on 2 separate readings.     she is currently not taking any blood pressure medications.    3) Lipids/HPL: Her recent lipid panel shows controlled LDL at 82.  She is advised to continue atorvastatin 10 mg p.o. nightly.    Side effects and precautions discussed with her.  4) vitamin D D deficiency- 25 hydroxy vitamin D remains low despite a course of treatment with vitamin D3 5000 units for 90 days .she is approached for retreatment with vitamin D3 5000 units daily for the next 90 days.     5) Chronic Care/Health Maintenance:  -she  is on Statin medications and  is encouraged to initiate and continue to follow up with Ophthalmology, Dentist,  Podiatrist at least yearly or according to recommendations, and advised to   stay away from smoking. I have recommended yearly flu vaccine and pneumonia vaccine at least every 5 years; moderate intensity exercise for up to 150 minutes weekly; and  sleep for at least 7 hours a day.  - I advised patient to maintain close follow up with Renee Rival, NP for primary care needs.  - Patient Care Time Today:  25 min, of which >50% was spent in  counseling and the rest reviewing her  current and  previous labs/studies, previous treatments, her blood glucose readings, and medications' doses and developing a plan for long-term care based on the latest recommendations for standards of care.   Lindsey Vazquez participated in the discussions, expressed understanding, and voiced agreement with the above plans.  All questions were answered to her  satisfaction. she is encouraged to contact clinic should she have any questions or concerns prior to her return visit.    Follow up plan: - Return in about 4 months (around 06/23/2019), or logs -8, for Follow up with Pre-visit Labs, Meter, and Logs.  Glade Lloyd, MD [facility] [address] [place], [place] [address] Phone: [phone]  Fax: [phone]    02/20/2019, 3:36 PM  This note was partially dictated with voice recognition software. Similar sounding words can be transcribed inadequately or may not  be corrected upon review.

## 2019-05-16 ENCOUNTER — Other Ambulatory Visit: Payer: Self-pay | Admitting: "Endocrinology

## 2019-06-19 LAB — COMPLETE METABOLIC PANEL WITH GFR
AG Ratio: 1.3 (calc) (ref 1.0–2.5)
ALT: 15 U/L (ref 6–29)
AST: 18 U/L (ref 10–35)
Albumin: 3.9 g/dL (ref 3.6–5.1)
Alkaline phosphatase (APISO): 125 U/L (ref 37–153)
BUN/Creatinine Ratio: 22 (calc) (ref 6–22)
BUN: 10 mg/dL (ref 7–25)
CO2: 27 mmol/L (ref 20–32)
Calcium: 9.5 mg/dL (ref 8.6–10.4)
Chloride: 105 mmol/L (ref 98–110)
Creat: 0.46 mg/dL — ABNORMAL LOW (ref 0.50–1.05)
GFR, Est African American: 134 mL/min/{1.73_m2} (ref >=60)
GFR, Est Non African American: 116 mL/min/{1.73_m2} (ref >=60)
Globulin: 3.1 g/dL (calc) (ref 1.9–3.7)
Glucose, Bld: 124 mg/dL — ABNORMAL HIGH (ref 65–99)
Potassium: 4.6 mmol/L (ref 3.5–5.3)
Sodium: 139 mmol/L (ref 135–146)
Total Bilirubin: 0.5 mg/dL (ref 0.2–1.2)
Total Protein: 7 g/dL (ref 6.1–8.1)

## 2019-06-19 LAB — HEMOGLOBIN A1C
Hgb A1c MFr Bld: 9.1 % of total Hgb — ABNORMAL HIGH (ref <5.7)
Mean Plasma Glucose: 214 (calc)
eAG (mmol/L): 11.9 (calc)

## 2019-06-23 ENCOUNTER — Ambulatory Visit (INDEPENDENT_AMBULATORY_CARE_PROVIDER_SITE_OTHER): Payer: 59 | Admitting: "Endocrinology

## 2019-06-23 ENCOUNTER — Other Ambulatory Visit: Payer: Self-pay

## 2019-06-23 ENCOUNTER — Encounter: Payer: Self-pay | Admitting: "Endocrinology

## 2019-06-23 DIAGNOSIS — E782 Mixed hyperlipidemia: Secondary | ICD-10-CM | POA: Diagnosis not present

## 2019-06-23 DIAGNOSIS — E1165 Type 2 diabetes mellitus with hyperglycemia: Secondary | ICD-10-CM | POA: Diagnosis not present

## 2019-06-23 DIAGNOSIS — I10 Essential (primary) hypertension: Secondary | ICD-10-CM | POA: Diagnosis not present

## 2019-06-23 MED ORDER — GLIPIZIDE ER 5 MG PO TB24: 5.0000 mg | ORAL_TABLET | Freq: Every day | ORAL | 3 refills | Status: DC

## 2019-06-23 NOTE — Progress Notes (Signed)
06/23/2019, 2:50 PM                                                        Endocrinology Telehealth Visit Follow up Note -During COVID -19 Pandemic  This visit type was conducted due to national recommendations for restrictions regarding the COVID-19 Pandemic  in an effort to limit this patient's exposure and mitigate transmission of the corona virus.  Due to her co-morbid illnesses, Lindsey Vazquez is at  moderate to high risk for complications without adequate follow up.  This format is felt to be most appropriate for her at this time.  I connected with this patient on 06/23/2019   by telephone and verified that I am speaking with the correct person using two identifiers. Lindsey Vazquez, Dec 23, 1970. she has verbally consented to this visit. All issues noted in this document were discussed and addressed. The format was not optimal for physical exam.    Subjective:    Patient ID: Lindsey Vazquez, female    DOB: [birth year].  Lindsey Vazquez is being engaged in telehealth via WebEx in follow-up for management of currently uncontrolled symptomatic type 2 diabetes, hyperlipidemia, vitamin D deficiency. PCP:   Renee Rival, NP.   Past Medical History:  Diagnosis Date  . Diabetes mellitus without complication ([place])   . Hyperlipemia    Past Surgical History:  Procedure Laterality Date  . c-section     Social History   Socioeconomic History  . Marital status: Married    Spouse name: Not on file  . Number of children: Not on file  . Years of education: Not on file  . Highest education level: Not on file  Occupational History  . Not on file  Social Needs  . Financial resource strain: Not on file  . Food insecurity    Worry: Not on file    Inability: Not on file  . Transportation needs    Medical: Not on file    Non-medical: Not on file  Tobacco Use  . Smoking status: Former Smoker    Quit date: 07/25/2015    Years since quitting:  3.9  . Smokeless tobacco: Never Used  Substance and Sexual Activity  . Alcohol use: No    Comment: socially  . Drug use: No  . Sexual activity: Yes  Lifestyle  . Physical activity    Days per week: Not on file    Minutes per session: Not on file  . Stress: Not on file  Relationships  . Social Herbalist on phone: Not on file    Gets together: Not on file    Attends religious service: Not on file    Active member of club or organization: Not on file    Attends meetings of clubs or organizations: Not on file    Relationship status: Not on file  Other Topics Concern  . Not on file  Social History Narrative  . Not on file   Outpatient Encounter Medications as of 06/23/2019  Medication Sig  . atorvastatin (LIPITOR) 20 MG tablet Take 0.5 tablets (10 mg total) by mouth daily.  . Cholecalciferol (VITAMIN D-3) 125 MCG (5000 UT) TABS Take 5,000 Units by mouth once a week.  Marland Kitchen  glipiZIDE (GLUCOTROL XL) 5 MG 24 hr tablet Take 1 tablet (5 mg total) by mouth daily with breakfast.  . glucose blood test strip 1 each by Other route 4 (four) times daily. Use as instructed 4 x daily. E11.65  . HUMALOG KWIKPEN 100 UNIT/ML KwikPen INJECT 20-26 UNITS S.Q. THREE TIMES A DAY BEFORE MEALS AS INSTRUCTED PER SLIDING SCALE.  Marland Kitchen ibuprofen (ADVIL,MOTRIN) 800 MG tablet Take 800 mg by mouth every 8 (eight) hours as needed.  . insulin degludec (TRESIBA) 100 UNIT/ML SOPN FlexTouch Pen Inject 0.6 mLs (60 Units total) into the skin at bedtime.  . Insulin Syringe-Needle U-100 (TRUEPLUS INSULIN SYRINGE) 31G X 5/16" 0.5 ML MISC Inject 1 each into the skin 4 (four) times daily.  . metFORMIN (GLUCOPHAGE) 500 MG tablet TAKE (1) TABLET BY MOUTH TWICE A DAY WITH MEALS (BREAKFAST AND SUPPER)  . Multiple Vitamin (MULTIVITAMIN) tablet Take 1 tablet by mouth daily.  Marland Kitchen torsemide (DEMADEX) 20 MG tablet Take 20 mg by mouth daily.  Nelva Nay SOLOSTAR 300 UNIT/ML SOPN INJECT 60 UNITS S.Q. AT BEDTIME.   No  facility-administered encounter medications on file as of 06/23/2019.     ALLERGIES: No Known Allergies  VACCINATION STATUS:  There is no immunization history on file for this patient.  Diabetes She presents for her follow-up diabetic visit. She has type 2 diabetes mellitus. Onset time: Diagnosed at approximate age of 89 years, after episode of gestational diabetes. Her disease course has been worsening. There are no hypoglycemic associated symptoms. Pertinent negatives for hypoglycemia include no confusion, headaches, pallor or seizures. Associated symptoms include fatigue, foot paresthesias, polydipsia and polyphagia. Pertinent negatives for diabetes include no chest pain and no polyuria. There are no hypoglycemic complications. Symptoms are worsening (She was recently hospitalized for DKA.). Diabetic complications include nephropathy. (Diabetes ketoacidosis.  She had acute renal failure which required brief.  Of hemodialysis during her hospitalization.) Risk factors for coronary artery disease include diabetes mellitus, dyslipidemia, hypertension, family history, obesity, sedentary lifestyle and tobacco exposure. Current diabetic treatment includes insulin injections. Her weight is increasing steadily. She is following a generally unhealthy diet. When asked about meal planning, she reported none. She rarely participates in exercise. Her home blood glucose trend is decreasing steadily. Her breakfast blood glucose range is generally 140-180 mg/dl. Her lunch blood glucose range is generally 180-200 mg/dl. Her dinner blood glucose range is generally 180-200 mg/dl. Her bedtime blood glucose range is generally 180-200 mg/dl. Her overall blood glucose range is 180-200 mg/dl. (She explains that she did have exposure to steroids on 2 different occasions since her last visit.  Her previsit labs show A1c of 9.1%, unchanged from her last visit A1c.) An ACE inhibitor/angiotensin II receptor blocker is not being  taken.  Hyperlipidemia This is a chronic problem. The current episode started more than 1 year ago. The problem is uncontrolled. Exacerbating diseases include diabetes and obesity. Pertinent negatives include no chest pain, myalgias or shortness of breath. Current antihyperlipidemic treatment includes statins. Risk factors for coronary artery disease include dyslipidemia, diabetes mellitus, hypertension, family history, obesity and a sedentary lifestyle.  Hypertension This is a chronic problem. The current episode started more than 1 year ago. Pertinent negatives include no chest pain, headaches, palpitations or shortness of breath. Risk factors for coronary artery disease include diabetes mellitus, dyslipidemia, sedentary lifestyle, smoking/tobacco exposure and obesity.   Review of systems: Limited as above   Objective:    There were no vitals taken for this visit.  Wt Readings from Last  3 Encounters:  08/22/18 204 lb (92.5 kg)  06/24/18 192 lb (87.1 kg)  05/23/18 205 lb (93 kg)     CMP     Component Value Date/Time   NA 139 06/18/2019 0841   K 4.6 06/18/2019 0841   CL 105 06/18/2019 0841   CO2 27 06/18/2019 0841   GLUCOSE 124 (H) 06/18/2019 0841   BUN 10 06/18/2019 0841   CREATININE 0.46 (L) 06/18/2019 0841   CALCIUM 9.5 06/18/2019 0841   PROT 7.0 06/18/2019 0841   ALBUMIN 4.3 02/07/2017 1333   AST 18 06/18/2019 0841   ALT 15 06/18/2019 0841   ALKPHOS 100 02/07/2017 1333   BILITOT 0.5 06/18/2019 0841   GFRNONAA 116 06/18/2019 0841   GFRAA 134 06/18/2019 0841   Recent Results (from the past 2160 hour(s))  Hemoglobin A1c     Status: Abnormal   Collection Time: 06/18/19  8:41 AM  Result Value Ref Range   Hgb A1c MFr Bld 9.1 (H) <5.7 % of total Hgb    Comment: For someone without known diabetes, a hemoglobin A1c value of 6.5% or greater indicates that they may have  diabetes and this should be confirmed with a follow-up  test. . For someone with known diabetes, a value  <7% indicates  that their diabetes is well controlled and a value  greater than or equal to 7% indicates suboptimal  control. A1c targets should be individualized based on  duration of diabetes, age, comorbid conditions, and  other considerations. . Currently, no consensus exists regarding use of hemoglobin A1c for diagnosis of diabetes for children. .    Mean Plasma Glucose 214 (calc)   eAG (mmol/L) 11.9 (calc)  COMPLETE METABOLIC PANEL WITH GFR     Status: Abnormal   Collection Time: 06/18/19  8:41 AM  Result Value Ref Range   Glucose, Bld 124 (H) 65 - 99 mg/dL    Comment: .            Fasting reference interval . For someone without known diabetes, a glucose value between 100 and 125 mg/dL is consistent with prediabetes and should be confirmed with a follow-up test. .    BUN 10 7 - 25 mg/dL   Creat 0.46 (L) 0.50 - 1.05 mg/dL    Comment: For patients >61 years of age, the reference limit for Creatinine is approximately 13% higher for people identified as African-American. .    GFR, Est Non African American 116 > OR = 60 mL/min/1.75m2   GFR, Est African American 134 > OR = 60 mL/min/1.39m2   BUN/Creatinine Ratio 22 6 - 22 (calc)   Sodium 139 135 - 146 mmol/L   Potassium 4.6 3.5 - 5.3 mmol/L   Chloride 105 98 - 110 mmol/L   CO2 27 20 - 32 mmol/L   Calcium 9.5 8.6 - 10.4 mg/dL   Total Protein 7.0 6.1 - 8.1 g/dL   Albumin 3.9 3.6 - 5.1 g/dL   Globulin 3.1 1.9 - 3.7 g/dL (calc)   AG Ratio 1.3 1.0 - 2.5 (calc)   Total Bilirubin 0.5 0.2 - 1.2 mg/dL   Alkaline phosphatase (APISO) 125 37 - 153 U/L   AST 18 10 - 35 U/L   ALT 15 6 - 29 U/L     Assessment & Plan:   1. Uncontrolled type 2 diabetes mellitus with hyperglycemia ([facility])  - Porchia G Pirie has currently uncontrolled symptomatic type 2 DM since 48 years of age. -She reports fluctuating glycemic profile, and A1c  of 9.1%.  -She did not have  hypoglycemic episodes since last visit.   Recent labs reviewed,  which also shows significant vitamin D deficiency.    Patient was hospitalized on February 16, 2018 for  euglycemic diabetic ketoacidosis which was associated with acute renal failure requiring brief period of hemodialysis. -her diabetes is complicated by DKA, obesity, and sedentary life and she remains at a high risk for more acute and chronic complications which include CAD, CVA, CKD, retinopathy, and neuropathy. These are all discussed in detail with her.  - I have counseled her on diet management and weight loss, by adopting a carbohydrate restricted/protein rich diet.  - she  admits there is a room for improvement in her diet and drink choices. -  Suggestion is made for her to avoid simple carbohydrates  from her diet including Cakes, Sweet Desserts / Pastries, Ice Cream, Soda (diet and regular), Sweet Tea, Candies, Chips, Cookies, Sweet Pastries,  Store Bought Juices, Alcohol in Excess of  1-2 drinks a day, Artificial Sweeteners, Coffee Creamer, and "Sugar-free" Products. This will help patient to have stable blood glucose profile and potentially avoid unintended weight gain.  - I encouraged her to switch to  unprocessed or minimally processed complex starch and increased protein intake (animal or plant source), fruits, and vegetables.  - she is advised to stick to a routine mealtimes to eat 3 meals  a day and avoid unnecessary snacks ( to snack only to correct hypoglycemia).   - I have approached her with the following individualized plan to manage diabetes and patient agrees:   -Based on her current glycemic burden, recent euglycemic DKA, I discussed with her the need for continued intensive treatment  with basal/bolus insulin treatment in order for her to achieve and maintain control of diabetes to target.   -She is advised to continue Toujeo 60 units nightly,  continue Humalog 20 units 3 times a day with meals  for pre-meal BG readings of 90-150mg /dl, plus patient specific correction dose for  unexpected hyperglycemia above 150mg /dl, associated with strict monitoring of glucose 4 times a day-before meals and at bedtime. - she is warned not to take insulin without proper monitoring per orders. -Adjustment parameters are given for hypo and hyperglycemia in writing. - she is encouraged to call clinic for blood glucose levels less than 70 or above 300 mg /dl.   -Advised to stay away from SGLT2 inhibitors given her recent history of euglycemic DKA while taking Jardiance.  - She is advised to continue metformin 500 mg p.o. twice daily-after breakfast and after supper.   -She will also benefit from low-dose glipizide.  I discussed and added glipizide 5 mg XL p.o. daily at breakfast.    - she will be considered for incretin therapy as appropriate next visit. - Patient specific target  A1c;  LDL, HDL, Triglycerides, and  Waist Circumference were discussed in detail.  2) BP/HTN:  she is advised to home monitor blood pressure and report if > 140/90 on 2 separate readings.     she is currently not taking any blood pressure medications.    3) Lipids/HPL: Her recent lipid panel shows controlled LDL at 82.  She is advised to continue atorvastatin 10 mg p.o. nightly.    Side effects and precautions discussed with her.  4) vitamin D D deficiency- 25 hydroxy vitamin D remains low despite a course of treatment with vitamin D3 5000 units for 90 days .she is approached for retreatment with vitamin D3 5000  units daily for the next 90 days.     5) Chronic Care/Health Maintenance:  -she  is on Statin medications and  is encouraged to initiate and continue to follow up with Ophthalmology, Dentist,  Podiatrist at least yearly or according to recommendations, and advised to   stay away from smoking. I have recommended yearly flu vaccine and pneumonia vaccine at least every 5 years; moderate intensity exercise for up to 150 minutes weekly; and  sleep for at least 7 hours a day.  - I advised patient to  maintain close follow up with Renee Rival, NP for primary care needs.  - Patient Care Time Today:  25 min, of which >50% was spent in  counseling and the rest reviewing her  current and  previous labs/studies, previous treatments, her blood glucose readings, and medications' doses and developing a plan for long-term care based on the latest recommendations for standards of care.   Lindsey Vazquez participated in the discussions, expressed understanding, and voiced agreement with the above plans.  All questions were answered to her satisfaction. she is encouraged to contact clinic should she have any questions or concerns prior to her return visit.   Follow up plan: - Return in about 3 months (around 09/21/2019) for Bring Meter and Logs- A1c in Office, Include 8 log sheets.  Glade Lloyd, MD [facility] [address] [place], [place] [address] Phone: [phone]  Fax: [phone]    06/23/2019, 2:50 PM  This note was partially dictated with voice recognition software. Similar sounding words can be transcribed inadequately or may not  be corrected upon review.

## 2019-07-18 ENCOUNTER — Other Ambulatory Visit: Payer: Self-pay | Admitting: "Endocrinology

## 2019-08-19 ENCOUNTER — Other Ambulatory Visit: Payer: Self-pay | Admitting: "Endocrinology

## 2019-08-23 ENCOUNTER — Other Ambulatory Visit: Payer: Self-pay | Admitting: "Endocrinology

## 2019-09-21 ENCOUNTER — Other Ambulatory Visit: Payer: Self-pay | Admitting: "Endocrinology

## 2019-09-23 ENCOUNTER — Other Ambulatory Visit: Payer: Self-pay

## 2019-09-23 ENCOUNTER — Ambulatory Visit (INDEPENDENT_AMBULATORY_CARE_PROVIDER_SITE_OTHER): Payer: 59 | Admitting: "Endocrinology

## 2019-09-23 ENCOUNTER — Encounter: Payer: Self-pay | Admitting: "Endocrinology

## 2019-09-23 VITALS — BP 118/81 | HR 99 | Ht 61.0 in | Wt 254.4 lb

## 2019-09-23 DIAGNOSIS — E559 Vitamin D deficiency, unspecified: Secondary | ICD-10-CM | POA: Diagnosis not present

## 2019-09-23 DIAGNOSIS — E1165 Type 2 diabetes mellitus with hyperglycemia: Secondary | ICD-10-CM

## 2019-09-23 DIAGNOSIS — I10 Essential (primary) hypertension: Secondary | ICD-10-CM

## 2019-09-23 DIAGNOSIS — E782 Mixed hyperlipidemia: Secondary | ICD-10-CM

## 2019-09-23 LAB — POCT GLYCOSYLATED HEMOGLOBIN (HGB A1C): Hemoglobin A1C: 10.1 % — AB (ref 4.0–5.6)

## 2019-09-23 MED ORDER — INSULIN LISPRO 100 UNIT/ML ~~LOC~~ SOLN: 20.0000 [IU] | Freq: Three times a day (TID) | SUBCUTANEOUS | 0 refills | Status: DC

## 2019-09-23 MED ORDER — TOUJEO SOLOSTAR 300 UNIT/ML ~~LOC~~ SOPN: 70.0000 [IU] | PEN_INJECTOR | Freq: Every day | SUBCUTANEOUS | 2 refills | Status: DC

## 2019-09-23 NOTE — Patient Instructions (Signed)

## 2019-09-23 NOTE — Progress Notes (Signed)
09/23/2019, 4:55 PM                                                     Endocrinology follow-up note   Subjective:    Patient ID: Lindsey Vazquez, female    DOB: 1971-03-07.  Lindsey Vazquez is being seen in follow-up for management of currently uncontrolled symptomatic type 2 diabetes, hyperlipidemia, vitamin D deficiency. PCP:   Renee Rival, NP.   Past Medical History:  Diagnosis Date  . Diabetes mellitus without complication ([place])   . Hyperlipemia    Past Surgical History:  Procedure Laterality Date  . c-section     Social History   Socioeconomic History  . Marital status: Married    Spouse name: Not on file  . Number of children: Not on file  . Years of education: Not on file  . Highest education level: Not on file  Occupational History  . Not on file  Tobacco Use  . Smoking status: Former Smoker    Quit date: 07/25/2015    Years since quitting: 4.1  . Smokeless tobacco: Never Used  Substance and Sexual Activity  . Alcohol use: No    Comment: socially  . Drug use: No  . Sexual activity: Yes  Other Topics Concern  . Not on file  Social History Narrative  . Not on file   Social Determinants of Health   Financial Resource Strain:   . Difficulty of Paying Living Expenses: Not on file  Food Insecurity:   . Worried About Charity fundraiser in the Last Year: Not on file  . Ran Out of Food in the Last Year: Not on file  Transportation Needs:   . Lack of Transportation (Medical): Not on file  . Lack of Transportation (Non-Medical): Not on file  Physical Activity:   . Days of Exercise per Week: Not on file  . Minutes of Exercise per Session: Not on file  Stress:   . Feeling of Stress : Not on file  Social Connections:   . Frequency of Communication with Friends and Family: Not on file  . Frequency of Social Gatherings with Friends and Family: Not on file  . Attends Religious Services: Not on file  . Active  Member of Clubs or Organizations: Not on file  . Attends Archivist Meetings: Not on file  . Marital Status: Not on file   Outpatient Encounter Medications as of 09/23/2019  Medication Sig  . atorvastatin (LIPITOR) 20 MG tablet Take 0.5 tablets (10 mg total) by mouth daily.  . Cholecalciferol (VITAMIN D-3) 125 MCG (5000 UT) TABS Take 5,000 Units by mouth once a week.  Marland Kitchen glipiZIDE (GLUCOTROL XL) 5 MG 24 hr tablet Take 1 tablet (5 mg total) by mouth daily with breakfast.  . glucose blood test strip 1 each by Other route 4 (four) times daily. Use as instructed 4 x daily. E11.65  . ibuprofen (ADVIL,MOTRIN) 800 MG tablet Take 800 mg by mouth every 8 (eight) hours as needed.  . Insulin Glargine, 1 Unit Dial, (TOUJEO SOLOSTAR) 300 UNIT/ML SOPN Inject 70 Units into the skin at bedtime.  . insulin lispro (HUMALOG) 100 UNIT/ML injection Inject 0.2-0.26 mLs (20-26 Units total) into the skin 3 (three) times daily  with meals.  . Insulin Syringe-Needle U-100 (TRUEPLUS INSULIN SYRINGE) 31G X 5/16" 0.5 ML MISC Inject 1 each into the skin 4 (four) times daily.  . metFORMIN (GLUCOPHAGE) 500 MG tablet TAKE (1) TABLET BY MOUTH TWICE A DAY WITH MEALS (BREAKFAST AND SUPPER)  . MOBIC 15 MG tablet Take 1 tablet by mouth once a day  Take once a day for 2 weeks then prn for pain after  . Multiple Vitamin (MULTIVITAMIN) tablet Take 1 tablet by mouth daily.  Marland Kitchen torsemide (DEMADEX) 20 MG tablet Take 20 mg by mouth daily.  . Vitamin D, Ergocalciferol, (DRISDOL) 1.25 MG (50000 UNIT) CAPS capsule TAKE 1 CAPSULE ONCE A WEEK  . [DISCONTINUED] HUMALOG KWIKPEN 100 UNIT/ML KwikPen INJECT 20-26 UNITS S.Q. THREE TIMES A DAY BEFORE MEALS AS INSTRUCTED PER SLIDING SCALE.  . [DISCONTINUED] insulin degludec (TRESIBA) 100 UNIT/ML SOPN FlexTouch Pen Inject 0.6 mLs (60 Units total) into the skin at bedtime.  . [DISCONTINUED] insulin lispro (HUMALOG) 100 UNIT/ML injection Inject 0.2 mLs (20 Units total) into the skin 3 (three) times  daily with meals.  . [DISCONTINUED] TOUJEO SOLOSTAR 300 UNIT/ML SOPN INJECT 60 UNITS S.Q. AT BEDTIME.   No facility-administered encounter medications on file as of 09/23/2019.    ALLERGIES: No Known Allergies  VACCINATION STATUS:  There is no immunization history on file for this patient.  Diabetes She presents for her follow-up diabetic visit. She has type 2 diabetes mellitus. Onset time: Diagnosed at approximate age of 62 years, after episode of gestational diabetes. Her disease course has been worsening. There are no hypoglycemic associated symptoms. Pertinent negatives for hypoglycemia include no confusion, headaches, pallor or seizures. Associated symptoms include foot paresthesias, polydipsia and polyphagia. Pertinent negatives for diabetes include no chest pain, no fatigue and no polyuria. There are no hypoglycemic complications. Symptoms are worsening (She was recently hospitalized for DKA.). Diabetic complications include nephropathy. (Diabetes ketoacidosis.  She had acute renal failure which required brief.  Of hemodialysis during her hospitalization.) Risk factors for coronary artery disease include diabetes mellitus, dyslipidemia, hypertension, family history, obesity, sedentary lifestyle and tobacco exposure. Current diabetic treatment includes insulin injections. Her weight is increasing steadily. She is following a generally unhealthy diet. When asked about meal planning, she reported none. She rarely participates in exercise. Her home blood glucose trend is decreasing steadily. Her breakfast blood glucose range is generally >200 mg/dl. Her lunch blood glucose range is generally >200 mg/dl. Her dinner blood glucose range is generally >200 mg/dl. Her bedtime blood glucose range is generally >200 mg/dl. Her overall blood glucose range is >200 mg/dl. (She presents with significantly above target glycemic profile.  Her point-of-care A1c is 10.1%, increasing from 9.1%.   ) An ACE  inhibitor/angiotensin II receptor blocker is not being taken.  Hyperlipidemia This is a chronic problem. The current episode started more than 1 year ago. The problem is uncontrolled. Exacerbating diseases include diabetes and obesity. Pertinent negatives include no chest pain, myalgias or shortness of breath. Current antihyperlipidemic treatment includes statins. Risk factors for coronary artery disease include dyslipidemia, diabetes mellitus, hypertension, family history, obesity and a sedentary lifestyle.  Hypertension This is a chronic problem. The current episode started more than 1 year ago. Pertinent negatives include no chest pain, headaches, palpitations or shortness of breath. Risk factors for coronary artery disease include diabetes mellitus, dyslipidemia, sedentary lifestyle, smoking/tobacco exposure and obesity.    Review of systems  Constitutional: + Minimally fluctuating body weight,  current  Body mass index is 48.07 kg/m. ,  no fatigue, no subjective hyperthermia, no subjective hypothermia Eyes: no blurry vision, no xerophthalmia ENT: no sore throat, no nodules palpated in throat, no dysphagia/odynophagia, no hoarseness Cardiovascular: no Chest Pain, no Shortness of Breath, no palpitations, no leg swelling Respiratory: no cough, no shortness of breath Gastrointestinal: no Nausea/Vomiting/Diarhhea Musculoskeletal: no muscle/joint aches Skin: no rashes, no hyperemia Neurological: no tremors, no numbness, no tingling, no dizziness Psychiatric: no depression, no anxiety    Objective:    BP 118/81   Pulse 99   Ht 5\' 1"  (1.549 m)   Wt 254 lb 6.4 oz (115.4 kg)   BMI 48.07 kg/m   Wt Readings from Last 3 Encounters:  09/23/19 254 lb 6.4 oz (115.4 kg)  08/22/18 204 lb (92.5 kg)  06/24/18 192 lb (87.1 kg)     Physical Exam- Limited  Constitutional:  Body mass index is 48.07 kg/m. , not in acute distress, normal state of mind Eyes:  EOMI, no exophthalmos Neck:  Supple Thyroid: No gross goiter Respiratory: Adequate breathing efforts Musculoskeletal: no gross deformities, strength intact in all four extremities, no gross restriction of joint movements Skin:  no rashes, no hyperemia Neurological: no tremor with outstretched hands,   CMP     Component Value Date/Time   NA 139 06/18/2019 0841   K 4.6 06/18/2019 0841   CL 105 06/18/2019 0841   CO2 27 06/18/2019 0841   GLUCOSE 124 (H) 06/18/2019 0841   BUN 10 06/18/2019 0841   CREATININE 0.46 (L) 06/18/2019 0841   CALCIUM 9.5 06/18/2019 0841   PROT 7.0 06/18/2019 0841   ALBUMIN 4.3 02/07/2017 1333   AST 18 06/18/2019 0841   ALT 15 06/18/2019 0841   ALKPHOS 100 02/07/2017 1333   BILITOT 0.5 06/18/2019 0841   GFRNONAA 116 06/18/2019 0841   GFRAA 134 06/18/2019 0841   Recent Results (from the past 2160 hour(s))  HgB A1c     Status: Abnormal   Collection Time: 09/23/19  3:44 PM  Result Value Ref Range   Hemoglobin A1C 10.1 (A) 4.0 - 5.6 %   HbA1c POC (<> result, manual entry)     HbA1c, POC (prediabetic range)     HbA1c, POC (controlled diabetic range)       Assessment & Plan:   1. Uncontrolled type 2 diabetes mellitus with hyperglycemia (H[facility])  - Lindsey Vazquez has currently uncontrolled symptomatic type 2 DM since 49 years of age. -She is significant loss of control of her diabetes with A1c of 10.1% increasing from 9.1%.  This was mainly as a result of her significant weight gain since last visit.   -She did not have  hypoglycemic episodes since last visit.   Recent labs reviewed, which also shows significant vitamin D deficiency.    Patient was hospitalized on February 16, 2018 for  euglycemic diabetic ketoacidosis which was associated with acute renal failure requiring brief period of hemodialysis. -her diabetes is complicated by DKA, obesity, and sedentary life and she remains at a high risk for more acute and chronic complications which include CAD, CVA, CKD, retinopathy, and  neuropathy. These are all discussed in detail with her.  - I have counseled her on diet management and weight loss, by adopting a carbohydrate restricted/protein rich diet.  - she  admits there is a room for improvement in her diet and drink choices. -  Suggestion is made for her to avoid simple carbohydrates  from her diet including Cakes, Sweet Desserts / Pastries, Ice Cream, Soda (diet and regular),  Sweet Tea, Candies, Chips, Cookies, Sweet Pastries,  Store Bought Juices, Alcohol in Excess of  1-2 drinks a day, Artificial Sweeteners, Coffee Creamer, and "Sugar-free" Products. This will help patient to have stable blood glucose profile and potentially avoid unintended weight gain.   - I encouraged her to switch to  unprocessed or minimally processed complex starch and increased protein intake (animal or plant source), fruits, and vegetables.  - she is advised to stick to a routine mealtimes to eat 3 meals  a day and avoid unnecessary snacks ( to snack only to correct hypoglycemia).   - I have approached her with the following individualized plan to manage diabetes and patient agrees:   -Based on her current glycemic burden, recent euglycemic DKA, she will continue to need intensive treatment with basal/bolus insulin in order for her to achieve and maintain control of diabetes to target.   -She is advised to increase Toujeo to 60 units nightly, increase Humalog to 20 units 3 times a day with meals  for pre-meal BG readings of 90-150mg /dl, plus patient specific correction dose for unexpected hyperglycemia above 150mg /dl, associated with strict monitoring of glucose 4 times a day-before meals and at bedtime. - she is warned not to take insulin without proper monitoring per orders. -Adjustment parameters are given for hypo and hyperglycemia in writing. - she is encouraged to call clinic for blood glucose levels less than 70 or above 300 mg /dl.   -Advised to stay away from SGLT2 inhibitors given  her recent history of euglycemic DKA while taking Jardiance.  - She is advised to continue metformin 500 mg p.o. twice daily-after breakfast and after supper.   -She we will continue to benefit from glipizide treatment.  She is advised to continue glipizide 5 mg XL p.o. daily at breakfast.     - she will be considered for incretin therapy as appropriate next visit. - Patient specific target  A1c;  LDL, HDL, Triglycerides, and  Waist Circumference were discussed in detail.  2) BP/HTN:   Her blood pressure is controlled to target.    she is currently not taking any blood pressure medications.    3) Lipids/HPL: Her recent lipid panel shows controlled LDL at 82.  She is advised to continue atorvastatin 10 mg p.o. nightly.    Side effects and precautions discussed with her.  4) vitamin D D deficiency- 25 hydroxy vitamin D remains low despite a course of treatment with vitamin D3 5000 units for 90 days .she is approached for retreatment with vitamin D3 5000 units daily for the next 90 days.     5) Chronic Care/Health Maintenance:  -she  is on Statin medications and  is encouraged to initiate and continue to follow up with Ophthalmology, Dentist,  Podiatrist at least yearly or according to recommendations, and advised to   stay away from smoking. I have recommended yearly flu vaccine and pneumonia vaccine at least every 5 years; moderate intensity exercise for up to 150 minutes weekly; and  sleep for at least 7 hours a day.  - I advised patient to maintain close follow up with Renee Rival, NP for primary care needs.  - Time spent on this patient care encounter:  35 min, of which > 50% was spent in  counseling and the rest reviewing her blood glucose logs , discussing her hypoglycemia and hyperglycemia episodes, reviewing her current and  previous labs / studies  ( including abstraction from other facilities) and medications  doses and developing  a  long term treatment plan and documenting her  care.   Please refer to Patient Instructions for Blood Glucose Monitoring and Insulin/Medications Dosing Guide"  in media tab for additional information. Please  also refer to " Patient Self Inventory" in the Media  tab for reviewed elements of pertinent patient history.  Lindsey Vazquez participated in the discussions, expressed understanding, and voiced agreement with the above plans.  All questions were answered to her satisfaction. she is encouraged to contact clinic should she have any questions or concerns prior to her return visit.    Follow up plan: - Return in about 3 months (around 12/24/2019) for Bring Meter and Logs- A1c in Office, Follow up with Pre-visit Labs.  Glade Lloyd, MD [facility] [address] [place], [place] [address] Phone: [phone]  Fax: [phone]    09/23/2019, 4:55 PM  This note was partially dictated with voice recognition software. Similar sounding words can be transcribed inadequately or may not  be corrected upon review.

## 2019-10-23 ENCOUNTER — Other Ambulatory Visit: Payer: Self-pay | Admitting: "Endocrinology

## 2019-10-31 ENCOUNTER — Other Ambulatory Visit: Payer: Self-pay

## 2019-10-31 ENCOUNTER — Telehealth: Payer: Self-pay | Admitting: "Endocrinology

## 2019-10-31 MED ORDER — INSULIN LISPRO (1 UNIT DIAL) 100 UNIT/ML (KWIKPEN): 20.0000 [IU] | PEN_INJECTOR | Freq: Three times a day (TID) | SUBCUTANEOUS | 0 refills | Status: DC

## 2019-10-31 NOTE — Telephone Encounter (Signed)
Rx refill for humalog kwik pens sent to [facility].

## 2019-10-31 NOTE — Telephone Encounter (Signed)
Pt said the pharmacy has her Humalog as valves and she uses pens. Can you send a new RX to [facility]

## 2019-11-08 ENCOUNTER — Other Ambulatory Visit: Payer: Self-pay | Admitting: "Endocrinology

## 2019-11-17 ENCOUNTER — Other Ambulatory Visit: Payer: Self-pay | Admitting: "Endocrinology

## 2019-12-08 ENCOUNTER — Other Ambulatory Visit: Payer: Self-pay

## 2019-12-08 ENCOUNTER — Telehealth: Payer: Self-pay | Admitting: "Endocrinology

## 2019-12-08 DIAGNOSIS — E559 Vitamin D deficiency, unspecified: Secondary | ICD-10-CM

## 2019-12-08 NOTE — Telephone Encounter (Signed)
Order for vitamin D changed.

## 2019-12-08 NOTE — Telephone Encounter (Signed)
Can you change her Vitamin D for [facility]?

## 2019-12-30 ENCOUNTER — Other Ambulatory Visit: Payer: Self-pay | Admitting: "Endocrinology

## 2019-12-30 LAB — COMPLETE METABOLIC PANEL WITH GFR
AG Ratio: 1.3 (calc) (ref 1.0–2.5)
ALT: 19 U/L (ref 6–29)
AST: 21 U/L (ref 10–35)
Albumin: 4 g/dL (ref 3.6–5.1)
Alkaline phosphatase (APISO): 142 U/L (ref 37–153)
BUN/Creatinine Ratio: 11 (calc) (ref 6–22)
BUN: 6 mg/dL — ABNORMAL LOW (ref 7–25)
CO2: 30 mmol/L (ref 20–32)
Calcium: 9.6 mg/dL (ref 8.6–10.4)
Chloride: 99 mmol/L (ref 98–110)
Creat: 0.55 mg/dL (ref 0.50–1.05)
GFR, Est African American: 127 mL/min/{1.73_m2} (ref >=60)
GFR, Est Non African American: 109 mL/min/{1.73_m2} (ref >=60)
Globulin: 3.1 g/dL (calc) (ref 1.9–3.7)
Glucose, Bld: 154 mg/dL — ABNORMAL HIGH (ref 65–99)
Potassium: 3.8 mmol/L (ref 3.5–5.3)
Sodium: 141 mmol/L (ref 135–146)
Total Bilirubin: 0.6 mg/dL (ref 0.2–1.2)
Total Protein: 7.1 g/dL (ref 6.1–8.1)

## 2019-12-30 LAB — TSH: TSH: 0.43 mIU/L

## 2019-12-30 LAB — LIPID PANEL
Cholesterol: 164 mg/dL (ref <200)
HDL: 49 mg/dL — ABNORMAL LOW (ref >=50)
LDL Cholesterol (Calc): 99 mg/dL (calc)
Non-HDL Cholesterol (Calc): 115 mg/dL (calc) (ref <130)
Total CHOL/HDL Ratio: 3.3 (calc) (ref <5.0)
Triglycerides: 75 mg/dL (ref <150)

## 2019-12-30 LAB — T4, FREE: Free T4: 1.3 ng/dL (ref 0.8–1.8)

## 2019-12-30 LAB — MICROALBUMIN / CREATININE URINE RATIO
Creatinine, Urine: 37 mg/dL (ref 20–275)
Microalb Creat Ratio: 35 mcg/mg creat — ABNORMAL HIGH (ref <30)
Microalb, Ur: 1.3 mg/dL

## 2019-12-30 LAB — VITAMIN D 25 HYDROXY (VIT D DEFICIENCY, FRACTURES): Vit D, 25-Hydroxy: 39 ng/mL (ref 30–100)

## 2020-01-07 ENCOUNTER — Encounter: Payer: Self-pay | Admitting: "Endocrinology

## 2020-01-07 ENCOUNTER — Ambulatory Visit (INDEPENDENT_AMBULATORY_CARE_PROVIDER_SITE_OTHER): Payer: 59 | Admitting: "Endocrinology

## 2020-01-07 ENCOUNTER — Other Ambulatory Visit: Payer: Self-pay

## 2020-01-07 VITALS — BP 132/83 | HR 112 | Ht 61.0 in | Wt 253.6 lb

## 2020-01-07 DIAGNOSIS — I10 Essential (primary) hypertension: Secondary | ICD-10-CM | POA: Diagnosis not present

## 2020-01-07 DIAGNOSIS — E1165 Type 2 diabetes mellitus with hyperglycemia: Secondary | ICD-10-CM | POA: Diagnosis not present

## 2020-01-07 DIAGNOSIS — E559 Vitamin D deficiency, unspecified: Secondary | ICD-10-CM | POA: Diagnosis not present

## 2020-01-07 DIAGNOSIS — E782 Mixed hyperlipidemia: Secondary | ICD-10-CM

## 2020-01-07 LAB — POCT GLYCOSYLATED HEMOGLOBIN (HGB A1C): Hemoglobin A1C: 10.6 % — AB (ref 4.0–5.6)

## 2020-01-07 MED ORDER — FREESTYLE LIBRE 14 DAY SENSOR MISC: 1.0000 | 2 refills | Status: DC

## 2020-01-07 MED ORDER — FREESTYLE LIBRE 14 DAY READER DEVI: 1.0000 | Freq: Once | 0 refills | Status: AC

## 2020-01-07 MED ORDER — TOUJEO SOLOSTAR 300 UNIT/ML ~~LOC~~ SOPN: 80.0000 [IU] | PEN_INJECTOR | Freq: Every day | SUBCUTANEOUS | 2 refills | Status: DC

## 2020-01-07 NOTE — Progress Notes (Signed)
01/07/2020, 6:17 PM                                                     Endocrinology follow-up note   Subjective:    Patient ID: Lindsey Vazquez, female    DOB: 1970/12/18.  Lindsey Vazquez is being seen in follow-up for management of currently uncontrolled symptomatic type 2 diabetes, hyperlipidemia, vitamin D deficiency. PCP:   Renee Rival, NP.   Past Medical History:  Diagnosis Date  . Diabetes mellitus without complication ([facility])   . Hyperlipemia    Past Surgical History:  Procedure Laterality Date  . c-section     Social History   Socioeconomic History  . Marital status: Married    Spouse name: Not on file  . Number of children: Not on file  . Years of education: Not on file  . Highest education level: Not on file  Occupational History  . Not on file  Tobacco Use  . Smoking status: Former Smoker    Quit date: 07/25/2015    Years since quitting: 4.4  . Smokeless tobacco: Never Used  Vaping Use  . Vaping Use: Former  Substance and Sexual Activity  . Alcohol use: No    Comment: socially  . Drug use: No  . Sexual activity: Yes  Other Topics Concern  . Not on file  Social History Narrative  . Not on file   Social Determinants of Health   Financial Resource Strain:   . Difficulty of Paying Living Expenses:   Food Insecurity:   . Worried About Charity fundraiser in the Last Year:   . Arboriculturist in the Last Year:   Transportation Needs:   . Film/video editor (Medical):   Marland Kitchen Lack of Transportation (Non-Medical):   Physical Activity:   . Days of Exercise per Week:   . Minutes of Exercise per Session:   Stress:   . Feeling of Stress :   Social Connections:   . Frequency of Communication with Friends and Family:   . Frequency of Social Gatherings with Friends and Family:   . Attends Religious Services:   . Active Member of Clubs or Organizations:   . Attends Archivist Meetings:   Marland Kitchen  Marital Status:    Outpatient Encounter Medications as of 01/07/2020  Medication Sig  . atorvastatin (LIPITOR) 20 MG tablet Take 0.5 tablets (10 mg total) by mouth daily.  . Cholecalciferol (VITAMIN D-3) 125 MCG (5000 UT) TABS Take 5,000 Units by mouth once a week.  . Continuous Blood Gluc Receiver (FREESTYLE LIBRE 14 DAY READER) DEVI 1 each by Does not apply route once for 1 dose.  . Continuous Blood Gluc Sensor (FREESTYLE LIBRE 14 DAY SENSOR) MISC Inject 1 each into the skin every 14 (fourteen) days. Use as directed.  Marland Kitchen glipiZIDE (GLUCOTROL XL) 5 MG 24 hr tablet TAKE (1) TABLET BY MOUTH DAILY WITH BREAKFAST.  Marland Kitchen glucose blood test strip 1 each by Other route 4 (four) times daily. Use as instructed 4 x daily. E11.65  . ibuprofen (ADVIL,MOTRIN) 800 MG tablet Take 800 mg by mouth every 8 (eight) hours as needed.  . insulin glargine, 1 Unit Dial, (TOUJEO SOLOSTAR) 300 UNIT/ML Solostar Pen Inject 80 Units  into the skin at bedtime.  . insulin lispro (HUMALOG) 100 UNIT/ML KwikPen Inject 0.2 mLs (20 Units total) into the skin 3 (three) times daily.  . metFORMIN (GLUCOPHAGE) 500 MG tablet TAKE (1) TABLET BY MOUTH TWICE A DAY WITH MEALS (BREAKFAST AND SUPPER)  . MOBIC 15 MG tablet Take 1 tablet by mouth once a day  Take once a day for 2 weeks then prn for pain after  . Multiple Vitamin (MULTIVITAMIN) tablet Take 1 tablet by mouth daily.  Marland Kitchen torsemide (DEMADEX) 20 MG tablet Take 20 mg by mouth daily.  . Vitamin D, Ergocalciferol, (DRISDOL) 1.25 MG (50000 UNIT) CAPS capsule TAKE 1 CAPSULE ONCE A WEEK  . [DISCONTINUED] insulin glargine, 1 Unit Dial, (TOUJEO SOLOSTAR) 300 UNIT/ML Solostar Pen Inject 60 Units into the skin at bedtime.  . [DISCONTINUED] Insulin Syringe-Needle U-100 (TRUEPLUS INSULIN SYRINGE) 31G X 5/16" 0.5 ML MISC Inject 1 each into the skin 4 (four) times daily.   No facility-administered encounter medications on file as of 01/07/2020.    ALLERGIES: No Known Allergies  VACCINATION  STATUS:  There is no immunization history on file for this patient.  Diabetes She presents for her follow-up diabetic visit. She has type 2 diabetes mellitus. Onset time: Diagnosed at approximate age of 49 years, after episode of gestational diabetes. Her disease course has been worsening. There are no hypoglycemic associated symptoms. Pertinent negatives for hypoglycemia include no confusion, headaches, pallor or seizures. Associated symptoms include foot paresthesias, polydipsia and polyphagia. Pertinent negatives for diabetes include no chest pain, no fatigue and no polyuria. There are no hypoglycemic complications. Symptoms are worsening (She was recently hospitalized for DKA.). Diabetic complications include nephropathy. (Diabetes ketoacidosis.  She had acute renal failure which required brief.  Of hemodialysis during her hospitalization.) Risk factors for coronary artery disease include diabetes mellitus, dyslipidemia, hypertension, family history, obesity, sedentary lifestyle and tobacco exposure. Current diabetic treatment includes insulin injections. Her weight is fluctuating minimally. She is following a generally unhealthy diet. When asked about meal planning, she reported none. She rarely participates in exercise. Her home blood glucose trend is increasing steadily. Her breakfast blood glucose range is generally >200 mg/dl. Her lunch blood glucose range is generally >200 mg/dl. Her dinner blood glucose range is generally >200 mg/dl. Her bedtime blood glucose range is generally >200 mg/dl. Her overall blood glucose range is >200 mg/dl. (She presents with severe and persistent hyperglycemia, point-of-care A1c of 10.6%, progressively increasing from 9.1%.  She follows a healthy diet habits of continued smoking.   ) An ACE inhibitor/angiotensin II receptor blocker is not being taken.  Hyperlipidemia This is a chronic problem. The current episode started more than 1 year ago. The problem is  uncontrolled. Exacerbating diseases include diabetes and obesity. Pertinent negatives include no chest pain, myalgias or shortness of breath. Current antihyperlipidemic treatment includes statins. Risk factors for coronary artery disease include dyslipidemia, diabetes mellitus, hypertension, family history, obesity and a sedentary lifestyle.  Hypertension This is a chronic problem. The current episode started more than 1 year ago. Pertinent negatives include no chest pain, headaches, palpitations or shortness of breath. Risk factors for coronary artery disease include diabetes mellitus, dyslipidemia, sedentary lifestyle, smoking/tobacco exposure and obesity.    Review of systems  Constitutional: + Minimally fluctuating body weight,  current  Body mass index is 47.92 kg/m. , no fatigue, no subjective hyperthermia, no subjective hypothermia Eyes: no blurry vision, no xerophthalmia ENT: no sore throat, no nodules palpated in throat, no dysphagia/odynophagia, no hoarseness Cardiovascular:  no Chest Pain, no Shortness of Breath, no palpitations, no leg swelling Respiratory: no cough, no shortness of breath Gastrointestinal: no Nausea/Vomiting/Diarhhea Musculoskeletal: no muscle/joint aches Skin: no rashes, no hyperemia Neurological: no tremors, no numbness, no tingling, no dizziness Psychiatric: no depression, no anxiety    Objective:    BP 132/83   Pulse (!) 112   Ht 5\' 1"  (1.549 m)   Wt 253 lb 9.6 oz (115 kg)   BMI 47.92 kg/m   Wt Readings from Last 3 Encounters:  01/07/20 253 lb 9.6 oz (115 kg)  09/23/19 254 lb 6.4 oz (115.4 kg)  08/22/18 204 lb (92.5 kg)     Physical Exam- Limited  Constitutional:  Body mass index is 47.92 kg/m. , not in acute distress, normal state of mind Eyes:  EOMI, no exophthalmos Neck: Supple Thyroid: No gross goiter Respiratory: Adequate breathing efforts Musculoskeletal: no gross deformities, strength intact in all four extremities, no gross  restriction of joint movements Skin:  no rashes, no hyperemia Neurological: no tremor with outstretched hands,   CMP     Component Value Date/Time   NA 141 12/29/2019 1004   K 3.8 12/29/2019 1004   CL 99 12/29/2019 1004   CO2 30 12/29/2019 1004   GLUCOSE 154 (H) 12/29/2019 1004   BUN 6 (L) 12/29/2019 1004   CREATININE 0.55 12/29/2019 1004   CALCIUM 9.6 12/29/2019 1004   PROT 7.1 12/29/2019 1004   ALBUMIN 4.3 02/07/2017 1333   AST 21 12/29/2019 1004   ALT 19 12/29/2019 1004   ALKPHOS 100 02/07/2017 1333   BILITOT 0.6 12/29/2019 1004   GFRNONAA 109 12/29/2019 1004   GFRAA 127 12/29/2019 1004   Recent Results (from the past 2160 hour(s))  COMPLETE METABOLIC PANEL WITH GFR     Status: Abnormal   Collection Time: 12/29/19 10:04 AM  Result Value Ref Range   Glucose, Bld 154 (H) 65 - 99 mg/dL    Comment: .            Fasting reference interval . For someone without known diabetes, a glucose value >125 mg/dL indicates that they may have diabetes and this should be confirmed with a follow-up test. .    BUN 6 (L) 7 - 25 mg/dL   Creat 0.55 0.50 - 1.05 mg/dL    Comment: For patients >32 years of age, the reference limit for Creatinine is approximately 13% higher for people identified as African-American. .    GFR, Est Non African American 109 > OR = 60 mL/min/1.23m2   GFR, Est African American 127 > OR = 60 mL/min/1.57m2   BUN/Creatinine Ratio 11 6 - 22 (calc)   Sodium 141 135 - 146 mmol/L   Potassium 3.8 3.5 - 5.3 mmol/L   Chloride 99 98 - 110 mmol/L   CO2 30 20 - 32 mmol/L   Calcium 9.6 8.6 - 10.4 mg/dL   Total Protein 7.1 6.1 - 8.1 g/dL   Albumin 4.0 3.6 - 5.1 g/dL   Globulin 3.1 1.9 - 3.7 g/dL (calc)   AG Ratio 1.3 1.0 - 2.5 (calc)   Total Bilirubin 0.6 0.2 - 1.2 mg/dL   Alkaline phosphatase (APISO) 142 37 - 153 U/L   AST 21 10 - 35 U/L   ALT 19 6 - 29 U/L  Microalbumin / creatinine urine ratio     Status: Abnormal   Collection Time: 12/29/19 10:04 AM  Result  Value Ref Range   Creatinine, Urine 37 20 - 275 mg/dL  Microalb, Ur 1.3 mg/dL    Comment: Reference Range Not established    Microalb Creat Ratio 35 (H) <30 mcg/mg creat    Comment: . The ADA defines abnormalities in albumin excretion as follows: Marland Kitchen Category         Result (mcg/mg creatinine) . Normal                    <30 Microalbuminuria         30-299  Clinical albuminuria   > OR = 300 . The ADA recommends that at least two of three specimens collected within a 3-6 month period be abnormal before considering a patient to be within a diagnostic category.   Lipid panel     Status: Abnormal   Collection Time: 12/29/19 10:04 AM  Result Value Ref Range   Cholesterol 164 <200 mg/dL   HDL 49 (L) > OR = 50 mg/dL   Triglycerides 75 <150 mg/dL   LDL Cholesterol (Calc) 99 mg/dL (calc)    Comment: Reference range: <100 . Desirable range <100 mg/dL for primary prevention;   <70 mg/dL for patients with CHD or diabetic patients  with > or = 2 CHD risk factors. Marland Kitchen LDL-C is now calculated using the Martin-Hopkins  calculation, which is a validated novel method providing  better accuracy than the Friedewald equation in the  estimation of LDL-C.  Cresenciano Genre et al. Annamaria Helling. 2202;542(70): 2061-2068  (http://education.[facility].com/faq/FAQ164)    Total CHOL/HDL Ratio 3.3 <5.0 (calc)   Non-HDL Cholesterol (Calc) 115 <130 mg/dL (calc)    Comment: For patients with diabetes plus 1 major ASCVD risk  factor, treating to a non-HDL-C goal of <100 mg/dL  (LDL-C of <70 mg/dL) is considered a therapeutic  option.   TSH     Status: None   Collection Time: 12/29/19 10:04 AM  Result Value Ref Range   TSH 0.43 mIU/L    Comment:           Reference Range .           > or = 20 Years  0.40-4.50 .                Pregnancy Ranges           First trimester    0.26-2.66           Second trimester   0.55-2.73           Third trimester    0.43-2.91   T4, free     Status: None   Collection  Time: 12/29/19 10:04 AM  Result Value Ref Range   Free T4 1.3 0.8 - 1.8 ng/dL  VITAMIN D 25 Hydroxy (Vit-D Deficiency, Fractures)     Status: None   Collection Time: 12/29/19 10:04 AM  Result Value Ref Range   Vit D, 25-Hydroxy 39 30 - 100 ng/mL    Comment: Vitamin D Status         25-OH Vitamin D: . Deficiency:                    <20 ng/mL Insufficiency:             20 - 29 ng/mL Optimal:                 > or = 30 ng/mL . For 25-OH Vitamin D testing on patients on  D2-supplementation and patients for whom quantitation  of D2 and D3 fractions is required, the [facility](TM) 25-OH VIT  D, (D2,D3), LC/MS/MS is recommended: order  code [phone] (patients >46yrs). See Note 1 . Note 1 . For additional information, please refer to  http://education.[facility].com/faq/FAQ199  (This link is being provided for informational/ educational purposes only.)   HgB A1c     Status: Abnormal   Collection Time: 01/07/20  3:54 PM  Result Value Ref Range   Hemoglobin A1C 10.6 (A) 4.0 - 5.6 %   HbA1c POC (<> result, manual entry)     HbA1c, POC (prediabetic range)     HbA1c, POC (controlled diabetic range)       Assessment & Plan:   1. Uncontrolled type 2 diabetes mellitus with hyperglycemia ([facility])  - Lindsey Vazquez has currently uncontrolled symptomatic type 2 DM since 49 years of age.  She presents with severe and persistent hyperglycemia, point-of-care A1c of 10.6%, progressively increasing from 9.1%.  She follows a healthy diet habits of continued smoking.   -She did not have  hypoglycemic episodes since last visit.   Recent labs reviewed, which also shows significant vitamin D deficiency.    Patient was hospitalized on February 16, 2018 for  euglycemic diabetic ketoacidosis which was associated with acute renal failure requiring brief period of hemodialysis. -her diabetes is complicated by DKA, obesity, and sedentary life and she remains at a high risk for more acute and chronic  complications which include CAD, CVA, CKD, retinopathy, and neuropathy. These are all discussed in detail with her.  - I have counseled her on diet management and weight loss, by adopting a carbohydrate restricted/protein rich diet.  - she  admits there is a room for improvement in her diet and drink choices. -  Suggestion is made for her to avoid simple carbohydrates  from her diet including Cakes, Sweet Desserts / Pastries, Ice Cream, Soda (diet and regular), Sweet Tea, Candies, Chips, Cookies, Sweet Pastries,  Store Bought Juices, Alcohol in Excess of  1-2 drinks a day, Artificial Sweeteners, Coffee Creamer, and "Sugar-free" Products. This will help patient to have stable blood glucose profile and potentially avoid unintended weight gain.  - I encouraged her to switch to  unprocessed or minimally processed complex starch and increased protein intake (animal or plant source), fruits, and vegetables.  - she is advised to stick to a routine mealtimes to eat 3 meals  a day and avoid unnecessary snacks ( to snack only to correct hypoglycemia).   - I have approached her with the following individualized plan to manage diabetes and patient agrees:   -Based on her current glycemic burden, recent euglycemic DKA, she will continue to need intensive treatment with basal/bolus insulin in order for her to achieve and maintain control of diabetes to target.   -She is advised to increase Toujeo to 80 units nightly, increase Humalog to 20 units 3 times a day with meals  for pre-meal BG readings of 90-150mg /dl, plus patient specific correction dose for unexpected hyperglycemia above 150mg /dl, associated with strict monitoring of glucose 4 times a day-before meals and at bedtime. - she is warned not to take insulin without proper monitoring per orders. -Adjustment parameters are given for hypo and hyperglycemia in writing. - she is encouraged to call clinic for blood glucose levels less than 70 or above 300 mg  /dl.   -Advised to stay away from SGLT2 inhibitors given her recent history of euglycemic DKA while taking Jardiance.  - She is advised to continue Metformin 500 mg p.o. twice daily-after breakfast and after supper.   -She we will  continue to benefit from glipizide treatment.  She is advised to continue glipizide 5 mg XL p.o. daily at breakfast.     - she will be considered for incretin therapy as appropriate next visit. - Patient specific target  A1c;  LDL, HDL, Triglycerides were discussed in detail.  2) BP/HTN:   Her blood pressure is controlled to target.    she is currently not taking any blood pressure medications.    3) Lipids/HPL: Her recent lipid panel shows controlled LDL at 82.  She is advised to continue atorvastatin 10 mg p.o. nightly.   Side effects and precautions discussed with her.  4) vitamin D D deficiency- 25 hydroxy vitamin D remains low despite a course of treatment with vitamin D3 5000 units for 90 days .she is approached for retreatment with vitamin D3 5000 units daily for the next 90 days.     5) Chronic Care/Health Maintenance:  -she  is on Statin medications and  is encouraged to initiate and continue to follow up with Ophthalmology, Dentist,  Podiatrist at least yearly or according to recommendations, and advised to   stay away from smoking. I have recommended yearly flu vaccine and pneumonia vaccine at least every 5 years; moderate intensity exercise for up to 150 minutes weekly; and  sleep for at least 7 hours a day.  - I advised patient to maintain close follow up with Renee Rival, NP for primary care needs.  - Time spent on this patient care encounter:  35 min, of which > 50% was spent in  counseling and the rest reviewing her blood glucose logs , discussing her hypoglycemia and hyperglycemia episodes, reviewing her current and  previous labs / studies  ( including abstraction from other facilities) and medications  doses and developing a  long term  treatment plan and documenting her care.   Please refer to Patient Instructions for Blood Glucose Monitoring and Insulin/Medications Dosing Guide"  in media tab for additional information. Please  also refer to " Patient Self Inventory" in the Media  tab for reviewed elements of pertinent patient history.  Lindsey Vazquez participated in the discussions, expressed understanding, and voiced agreement with the above plans.  All questions were answered to her satisfaction. she is encouraged to contact clinic should she have any questions or concerns prior to her return visit.    Follow up plan: - Return in about 3 months (around 04/08/2020) for Bring Meter and Logs- A1c in Office.  Glade Lloyd, MD [facility] [address] [place], [place] [address] Phone: [phone]  Fax: [phone]    01/07/2020, 6:17 PM  This note was partially dictated with voice recognition software. Similar sounding words can be transcribed inadequately or may not  be corrected upon review.

## 2020-01-07 NOTE — Patient Instructions (Signed)

## 2020-01-17 ENCOUNTER — Other Ambulatory Visit: Payer: Self-pay | Admitting: "Endocrinology

## 2020-02-04 ENCOUNTER — Other Ambulatory Visit: Payer: Self-pay | Admitting: "Endocrinology

## 2020-02-12 ENCOUNTER — Other Ambulatory Visit: Payer: Self-pay | Admitting: "Endocrinology

## 2020-03-11 ENCOUNTER — Other Ambulatory Visit: Payer: Self-pay | Admitting: "Endocrinology

## 2020-04-08 ENCOUNTER — Ambulatory Visit: Payer: 59 | Admitting: "Endocrinology

## 2020-04-12 ENCOUNTER — Other Ambulatory Visit: Payer: Self-pay | Admitting: "Endocrinology

## 2020-05-22 ENCOUNTER — Other Ambulatory Visit: Payer: Self-pay | Admitting: "Endocrinology

## 2020-05-29 ENCOUNTER — Other Ambulatory Visit: Payer: Self-pay | Admitting: "Endocrinology

## 2020-06-02 ENCOUNTER — Other Ambulatory Visit: Payer: Self-pay

## 2020-06-02 ENCOUNTER — Ambulatory Visit (INDEPENDENT_AMBULATORY_CARE_PROVIDER_SITE_OTHER): Payer: 59 | Admitting: "Endocrinology

## 2020-06-02 ENCOUNTER — Encounter: Payer: Self-pay | Admitting: "Endocrinology

## 2020-06-02 VITALS — BP 130/78 | HR 92 | Ht 61.0 in | Wt 259.6 lb

## 2020-06-02 DIAGNOSIS — E782 Mixed hyperlipidemia: Secondary | ICD-10-CM

## 2020-06-02 DIAGNOSIS — I10 Essential (primary) hypertension: Secondary | ICD-10-CM

## 2020-06-02 DIAGNOSIS — E1165 Type 2 diabetes mellitus with hyperglycemia: Secondary | ICD-10-CM

## 2020-06-02 MED ORDER — GLIPIZIDE ER 5 MG PO TB24
ORAL_TABLET | ORAL | 1 refills | Status: DC
Start: 2020-06-02 — End: 2020-08-20

## 2020-06-02 MED ORDER — INSULIN LISPRO (1 UNIT DIAL) 100 UNIT/ML (KWIKPEN): 24.0000 [IU] | PEN_INJECTOR | Freq: Three times a day (TID) | SUBCUTANEOUS | 2 refills | Status: DC

## 2020-06-02 MED ORDER — FREESTYLE LIBRE 14 DAY SENSOR MISC
1.0000 | 2 refills | Status: DC
Start: 2020-06-02 — End: 2020-08-16

## 2020-06-02 NOTE — Patient Instructions (Signed)

## 2020-06-02 NOTE — Progress Notes (Signed)
06/02/2020, 8:30 PM                                                     Endocrinology follow-up note   Subjective:    Patient ID: Lindsey Vazquez, female    DOB: [birth year].  Lindsey Vazquez is being seen in follow-up for management of currently uncontrolled symptomatic type 2 diabetes, hyperlipidemia, vitamin D deficiency. PCP:   Renee Rival, NP.   Past Medical History:  Diagnosis Date  . Diabetes mellitus without complication ([facility])   . Hyperlipemia    Past Surgical History:  Procedure Laterality Date  . c-section     Social History   Socioeconomic History  . Marital status: Married    Spouse name: Not on file  . Number of children: Not on file  . Years of education: Not on file  . Highest education level: Not on file  Occupational History  . Not on file  Tobacco Use  . Smoking status: Former Smoker    Quit date: 07/25/2015    Years since quitting: 4.8  . Smokeless tobacco: Never Used  Vaping Use  . Vaping Use: Former  Substance and Sexual Activity  . Alcohol use: No    Comment: socially  . Drug use: No  . Sexual activity: Yes  Other Topics Concern  . Not on file  Social History Narrative  . Not on file   Social Determinants of Health   Financial Resource Strain:   . Difficulty of Paying Living Expenses: Not on file  Food Insecurity:   . Worried About Charity fundraiser in the Last Year: Not on file  . Ran Out of Food in the Last Year: Not on file  Transportation Needs:   . Lack of Transportation (Medical): Not on file  . Lack of Transportation (Non-Medical): Not on file  Physical Activity:   . Days of Exercise per Week: Not on file  . Minutes of Exercise per Session: Not on file  Stress:   . Feeling of Stress : Not on file  Social Connections:   . Frequency of Communication with Friends and Family: Not on file  . Frequency of Social Gatherings with Friends and Family: Not on file  . Attends Religious  Services: Not on file  . Active Member of Clubs or Organizations: Not on file  . Attends Archivist Meetings: Not on file  . Marital Status: Not on file   Outpatient Encounter Medications as of 06/02/2020  Medication Sig  . atorvastatin (LIPITOR) 20 MG tablet Take 0.5 tablets (10 mg total) by mouth daily.  . Cholecalciferol (VITAMIN D-3) 125 MCG (5000 UT) TABS Take 5,000 Units by mouth once a week.  . Continuous Blood Gluc Sensor (FREESTYLE LIBRE 14 DAY SENSOR) MISC Inject 1 each into the skin every 14 (fourteen) days. Use as directed.  Marland Kitchen glipiZIDE (GLUCOTROL XL) 5 MG 24 hr tablet TAKE (1) TABLET BY MOUTH DAILY WITH BREAKFAST.  Marland Kitchen ibuprofen (ADVIL,MOTRIN) 800 MG tablet Take 800 mg by mouth every 8 (eight) hours as needed.  . insulin glargine, 1 Unit Dial, (TOUJEO SOLOSTAR) 300 UNIT/ML Solostar Pen Inject 80 Units into the skin at bedtime.  . insulin lispro (HUMALOG KWIKPEN) 100 UNIT/ML KwikPen Inject 24-30 Units  into the skin 3 (three) times daily before meals.  . metFORMIN (GLUCOPHAGE) 500 MG tablet TAKE (1) TABLET BY MOUTH TWICE A DAY WITH MEALS (BREAKFAST AND SUPPER)  . MOBIC 15 MG tablet Take 1 tablet by mouth once a day  Take once a day for 2 weeks then prn for pain after  . Multiple Vitamin (MULTIVITAMIN) tablet Take 1 tablet by mouth daily.  . potassium chloride SA (KLOR-CON) 20 MEQ tablet Take 1 tablet by mouth daily.  Marland Kitchen torsemide (DEMADEX) 20 MG tablet Take 20 mg by mouth daily.  . Vitamin D, Ergocalciferol, (DRISDOL) 1.25 MG (50000 UNIT) CAPS capsule TAKE 1 CAPSULE ONCE A WEEK  . [DISCONTINUED] Continuous Blood Gluc Sensor (FREESTYLE LIBRE 14 DAY SENSOR) MISC Inject 1 each into the skin every 14 (fourteen) days. Use as directed.  . [DISCONTINUED] glipiZIDE (GLUCOTROL XL) 5 MG 24 hr tablet TAKE (1) TABLET BY MOUTH DAILY WITH BREAKFAST.  . [DISCONTINUED] glucose blood test strip 1 each by Other route 4 (four) times daily. Use as instructed 4 x daily. E11.65  . [DISCONTINUED]  HUMALOG KWIKPEN 100 UNIT/ML KwikPen INJECT 20 UNITS S.Q. THREE TIMES A DAY AS DIRECTED.   No facility-administered encounter medications on file as of 06/02/2020.    ALLERGIES: No Known Allergies  VACCINATION STATUS:  There is no immunization history on file for this patient.  Diabetes She presents for her follow-up diabetic visit. She has type 2 diabetes mellitus. Onset time: Diagnosed at approximate age of 49 years, after episode of gestational diabetes. Her disease course has been improving. There are no hypoglycemic associated symptoms. Pertinent negatives for hypoglycemia include no confusion, headaches, pallor or seizures. Associated symptoms include foot paresthesias, polydipsia and polyphagia. Pertinent negatives for diabetes include no chest pain, no fatigue and no polyuria. There are no hypoglycemic complications. Symptoms are improving (She was recently hospitalized for DKA.). Diabetic complications include nephropathy. (Diabetes ketoacidosis.  She had acute renal failure which required brief.  Of hemodialysis during her hospitalization.) Risk factors for coronary artery disease include diabetes mellitus, dyslipidemia, hypertension, family history, obesity, sedentary lifestyle and tobacco exposure. Current diabetic treatment includes insulin injections. Her weight is increasing steadily. She is following a generally unhealthy diet. When asked about meal planning, she reported none. She rarely participates in exercise. Her home blood glucose trend is decreasing steadily. Her breakfast blood glucose range is generally 180-200 mg/dl. Her lunch blood glucose range is generally >200 mg/dl. Her dinner blood glucose range is generally >200 mg/dl. Her bedtime blood glucose range is generally >200 mg/dl. Her overall blood glucose range is >200 mg/dl. (She presents with her CGM device, analysis shows 25% time in range, 72% above range.  Her point-of-care A1c is 9.8% only slightly improving from 10.6%.      ) An ACE inhibitor/angiotensin II receptor blocker is not being taken.  Hyperlipidemia This is a chronic problem. The current episode started more than 1 year ago. The problem is uncontrolled. Exacerbating diseases include diabetes and obesity. Pertinent negatives include no chest pain, myalgias or shortness of breath. Current antihyperlipidemic treatment includes statins. Risk factors for coronary artery disease include dyslipidemia, diabetes mellitus, hypertension, family history, obesity and a sedentary lifestyle.  Hypertension This is a chronic problem. The current episode started more than 1 year ago. Pertinent negatives include no chest pain, headaches, palpitations or shortness of breath. Risk factors for coronary artery disease include diabetes mellitus, dyslipidemia, sedentary lifestyle, smoking/tobacco exposure and obesity.    Review of systems  Constitutional: + Minimally fluctuating  body weight,  current  Body mass index is 49.05 kg/m. , no fatigue, no subjective hyperthermia, no subjective hypothermia Eyes: no blurry vision, no xerophthalmia ENT: no sore throat, no nodules palpated in throat, no dysphagia/odynophagia, no hoarseness Cardiovascular: no Chest Pain, no Shortness of Breath, no palpitations, no leg swelling Respiratory: no cough, no shortness of breath Gastrointestinal: no Nausea/Vomiting/Diarhhea Musculoskeletal: no muscle/joint aches Skin: no rashes, no hyperemia Neurological: no tremors, no numbness, no tingling, no dizziness Psychiatric: no depression, no anxiety    Objective:    BP 130/78   Pulse 92   Ht 5\' 1"  (1.549 m)   Wt 259 lb 9.6 oz (117.8 kg)   BMI 49.05 kg/m   Wt Readings from Last 3 Encounters:  06/02/20 259 lb 9.6 oz (117.8 kg)  01/07/20 253 lb 9.6 oz (115 kg)  09/23/19 254 lb 6.4 oz (115.4 kg)      Physical Exam- Limited  Constitutional:  Body mass index is 49.05 kg/m. , not in acute distress, normal state of mind Eyes:  EOMI, no  exophthalmos Neck: Supple Thyroid: No gross goiter Respiratory: Adequate breathing efforts Musculoskeletal: no gross deformities, strength intact in all four extremities, no gross restriction of joint movements Skin:  no rashes, no hyperemia Neurological: no tremor with outstretched hands   CMP     Component Value Date/Time   NA 141 12/29/2019 1004   K 3.8 12/29/2019 1004   CL 99 12/29/2019 1004   CO2 30 12/29/2019 1004   GLUCOSE 154 (H) 12/29/2019 1004   BUN 6 (L) 12/29/2019 1004   CREATININE 0.55 12/29/2019 1004   CALCIUM 9.6 12/29/2019 1004   PROT 7.1 12/29/2019 1004   ALBUMIN 4.3 02/07/2017 1333   AST 21 12/29/2019 1004   ALT 19 12/29/2019 1004   ALKPHOS 100 02/07/2017 1333   BILITOT 0.6 12/29/2019 1004   GFRNONAA 109 12/29/2019 1004   GFRAA 127 12/29/2019 1004   Lipid Panel     Component Value Date/Time   CHOL 164 12/29/2019 1004   TRIG 75 12/29/2019 1004   HDL 49 (L) 12/29/2019 1004   CHOLHDL 3.3 12/29/2019 1004   LDLCALC 99 12/29/2019 1004     Assessment & Plan:   1. Uncontrolled type 2 diabetes mellitus with hyperglycemia ([facility])  - Lafern G Yinger has currently uncontrolled symptomatic type 2 DM since 49 years of age.  She presents with her CGM device, analysis shows 25% time in range, 72% above range.  Her point-of-care A1c is 9.8% only slightly improving from 10.6%.   -She did not have  hypoglycemic episodes since last visit.   Recent labs reviewed, which also shows significant vitamin D deficiency.    Patient was hospitalized on February 16, 2018 for  euglycemic diabetic ketoacidosis which was associated with acute renal failure requiring brief period of hemodialysis. -her diabetes is complicated by DKA, obesity, and sedentary life and she remains at a high risk for more acute and chronic complications which include CAD, CVA, CKD, retinopathy, and neuropathy. These are all discussed in detail with her.  - I have counseled her on diet management and  weight loss, by adopting a carbohydrate restricted/protein rich diet.  - she  admits there is a room for improvement in her diet and drink choices. -  Suggestion is made for her to avoid simple carbohydrates  from her diet including Cakes, Sweet Desserts / Pastries, Ice Cream, Soda (diet and regular), Sweet Tea, Candies, Chips, Cookies, Sweet Pastries,  Store Bought Juices, Alcohol in  Excess of  1-2 drinks a day, Artificial Sweeteners, Coffee Creamer, and "Sugar-free" Products. This will help patient to have stable blood glucose profile and potentially avoid unintended weight gain.   - I encouraged her to switch to  unprocessed or minimally processed complex starch and increased protein intake (animal or plant source), fruits, and vegetables.  - she is advised to stick to a routine mealtimes to eat 3 meals  a day and avoid unnecessary snacks ( to snack only to correct hypoglycemia).   - I have approached her with the following individualized plan to manage diabetes and patient agrees:   -Based on her current glycemic burden, recent euglycemic DKA, she will continue to need intensive treatment with basal/bolus insulin in order for her to achieve and maintain control of diabetes to target.    -In light of her presentation with significantly above target postprandial glycemic profile, she will need a higher dose of prandial insulin. -She is advised to continue Toujeo 80 units nightly, increase Humalog to 24 units 3 times a day with meals  for pre-meal BG readings of 90-150mg /dl, plus patient specific correction dose for unexpected hyperglycemia above 150mg /dl, associated with strict monitoring of glucose 4 times a day-before meals and at bedtime. - she is warned not to take insulin without proper monitoring per orders. -Adjustment parameters are given for hypo and hyperglycemia in writing. - she is encouraged to call clinic for blood glucose levels less than 70 or above 300 mg /dl.   -Advised to  stay away from SGLT2 inhibitors given her recent history of euglycemic DKA while taking Jardiance.  - She is advised to continue Metformin 500 mg p.o. twice daily--after breakfast and after supper.   -She is advised to continue glipizide 5 mg XL p.o. daily at breakfast.     - she will be considered for incretin therapy as appropriate next visit. - Patient specific target  A1c;  LDL, HDL, Triglycerides were discussed in detail.  2) BP/HTN:   -Her blood pressure is controlled to target.    she is currently not taking any blood pressure medications.    3) Lipids/HPL: Her recent lipid panel shows controlled LDL at 82.  She is advised to continue atorvastatin 10 mg p.o. nightly.   Side effects and precautions discussed with her.  4) vitamin  D deficiency- 25 hydroxy vitamin D remains low despite a course of treatment with vitamin D3 5000 units for 90 days .she is approached for retreatment with vitamin D3 5000 units daily for the next 90 days.     5) Chronic Care/Health Maintenance:  -she  is on Statin medications and  is encouraged to initiate and continue to follow up with Ophthalmology, Dentist,  Podiatrist at least yearly or according to recommendations, and advised to   stay away from smoking. I have recommended yearly flu vaccine and pneumonia vaccine at least every 5 years; moderate intensity exercise for up to 150 minutes weekly; and  sleep for at least 7 hours a day.  - I advised patient to maintain close follow up with Renee Rival, NP for primary care needs.  - Time spent on this patient care encounter:  35 min, of which > 50% was spent in  counseling and the rest reviewing her blood glucose logs , discussing her hypoglycemia and hyperglycemia episodes, reviewing her current and  previous labs / studies  ( including abstraction from other facilities) and medications  doses and developing a  long term treatment plan and  documenting her care.   Please refer to Patient Instructions  for Blood Glucose Monitoring and Insulin/Medications Dosing Guide"  in media tab for additional information. Please  also refer to " Patient Self Inventory" in the Media  tab for reviewed elements of pertinent patient history.  Lindsey Vazquez participated in the discussions, expressed understanding, and voiced agreement with the above plans.  All questions were answered to her satisfaction. she is encouraged to contact clinic should she have any questions or concerns prior to her return visit.    Follow up plan: - Return in about 4 months (around 09/30/2020) for Bring Meter and Logs- A1c in Office, ABI in Office NV, Urine MA - NV.  Glade Lloyd, MD [facility] [address] [place], [place] [address] Phone: [phone]  Fax: [phone]    06/02/2020, 8:30 PM  This note was partially dictated with voice recognition software. Similar sounding words can be transcribed inadequately or may not  be corrected upon review.

## 2020-06-07 ENCOUNTER — Other Ambulatory Visit: Payer: Self-pay | Admitting: "Endocrinology

## 2020-06-11 ENCOUNTER — Other Ambulatory Visit: Payer: Self-pay | Admitting: "Endocrinology

## 2020-07-16 ENCOUNTER — Other Ambulatory Visit: Payer: Self-pay | Admitting: "Endocrinology

## 2020-08-13 ENCOUNTER — Other Ambulatory Visit: Payer: Self-pay | Admitting: "Endocrinology

## 2020-08-16 ENCOUNTER — Other Ambulatory Visit: Payer: Self-pay

## 2020-08-16 MED ORDER — FREESTYLE LIBRE 14 DAY SENSOR MISC
1.0000 | 2 refills | Status: DC
Start: 2020-08-16 — End: 2020-11-08

## 2020-08-20 ENCOUNTER — Other Ambulatory Visit: Payer: Self-pay | Admitting: "Endocrinology

## 2020-09-22 ENCOUNTER — Other Ambulatory Visit: Payer: Self-pay | Admitting: "Endocrinology

## 2020-09-24 LAB — COMPREHENSIVE METABOLIC PANEL
ALT: 16 IU/L (ref 0–32)
AST: 18 IU/L (ref 0–40)
Albumin/Globulin Ratio: 1.4 (ref 1.2–2.2)
Albumin: 4.7 g/dL (ref 3.8–4.9)
Alkaline Phosphatase: 183 IU/L — ABNORMAL HIGH (ref 44–121)
BUN/Creatinine Ratio: 18 (ref 9–23)
BUN: 11 mg/dL (ref 6–24)
Bilirubin Total: 0.3 mg/dL (ref 0.0–1.2)
CO2: 24 mmol/L (ref 20–29)
Calcium: 10.5 mg/dL — ABNORMAL HIGH (ref 8.7–10.2)
Chloride: 98 mmol/L (ref 96–106)
Creatinine, Ser: 0.62 mg/dL (ref 0.57–1.00)
Globulin, Total: 3.3 g/dL (ref 1.5–4.5)
Glucose: 207 mg/dL — ABNORMAL HIGH (ref 65–99)
Potassium: 4.8 mmol/L (ref 3.5–5.2)
Sodium: 141 mmol/L (ref 134–144)
Total Protein: 8 g/dL (ref 6.0–8.5)
eGFR: 108 mL/min/{1.73_m2} (ref >59)

## 2020-09-30 ENCOUNTER — Ambulatory Visit (INDEPENDENT_AMBULATORY_CARE_PROVIDER_SITE_OTHER): Payer: 59 | Admitting: "Endocrinology

## 2020-09-30 ENCOUNTER — Other Ambulatory Visit: Payer: Self-pay

## 2020-09-30 ENCOUNTER — Encounter: Payer: Self-pay | Admitting: "Endocrinology

## 2020-09-30 VITALS — BP 102/78 | HR 104 | Ht 61.0 in | Wt 261.6 lb

## 2020-09-30 DIAGNOSIS — E1165 Type 2 diabetes mellitus with hyperglycemia: Secondary | ICD-10-CM | POA: Diagnosis not present

## 2020-09-30 LAB — POCT UA - MICROALBUMIN
Albumin/Creatinine Ratio, Urine, POC: <30
Creatinine, POC: 300 mg/dL
Microalbumin Ur, POC: 10 mg/L

## 2020-09-30 LAB — POCT GLYCOSYLATED HEMOGLOBIN (HGB A1C): HbA1c, POC (controlled diabetic range): 10.3 % — AB (ref 0.0–7.0)

## 2020-09-30 MED ORDER — TOUJEO MAX SOLOSTAR 300 UNIT/ML ~~LOC~~ SOPN: 90.0000 [IU] | PEN_INJECTOR | Freq: Every day | SUBCUTANEOUS | 2 refills | Status: DC

## 2020-09-30 NOTE — Patient Instructions (Signed)

## 2020-09-30 NOTE — Progress Notes (Signed)
09/30/2020, 5:36 PM                                                     Endocrinology follow-up note   Subjective:    Patient ID: Lindsey Vazquez, female    DOB: 1971/05/22.  Lindsey Vazquez is being seen in follow-up for management of currently uncontrolled symptomatic type 2 diabetes, hyperlipidemia, vitamin D deficiency. PCP:   Renee Rival, NP.   Past Medical History:  Diagnosis Date  . Diabetes mellitus without complication ([place])   . Hyperlipemia    Past Surgical History:  Procedure Laterality Date  . c-section     Social History   Socioeconomic History  . Marital status: Married    Spouse name: Not on file  . Number of children: Not on file  . Years of education: Not on file  . Highest education level: Not on file  Occupational History  . Not on file  Tobacco Use  . Smoking status: Former Smoker    Quit date: 07/25/2015    Years since quitting: 5.1  . Smokeless tobacco: Never Used  Vaping Use  . Vaping Use: Former  Substance and Sexual Activity  . Alcohol use: No    Comment: socially  . Drug use: No  . Sexual activity: Yes  Other Topics Concern  . Not on file  Social History Narrative  . Not on file   Social Determinants of Health   Financial Resource Strain: Not on file  Food Insecurity: Not on file  Transportation Needs: Not on file  Physical Activity: Not on file  Stress: Not on file  Social Connections: Not on file   Outpatient Encounter Medications as of 09/30/2020  Medication Sig  . insulin glargine, 2 Unit Dial, (TOUJEO MAX SOLOSTAR) 300 UNIT/ML Solostar Pen Inject 90 Units into the skin at bedtime.  Marland Kitchen MAGNESIUM OXIDE, ANTACID, PO Take 1 tablet by mouth daily in the afternoon.  Marland Kitchen atorvastatin (LIPITOR) 20 MG tablet Take 0.5 tablets (10 mg total) by mouth daily.  . Cholecalciferol (VITAMIN D-3) 125 MCG (5000 UT) TABS Take 5,000 Units by mouth once a week.  . Continuous Blood Gluc Sensor (FREESTYLE  LIBRE 14 DAY SENSOR) MISC Inject 1 each into the skin every 14 (fourteen) days. Use as directed.  Marland Kitchen glipiZIDE (GLUCOTROL XL) 5 MG 24 hr tablet TAKE (1) TABLET BY MOUTH DAILY WITH BREAKFAST.  Marland Kitchen ibuprofen (ADVIL,MOTRIN) 800 MG tablet Take 800 mg by mouth every 8 (eight) hours as needed.  . insulin lispro (HUMALOG KWIKPEN) 100 UNIT/ML KwikPen Inject 24-30 Units into the skin 3 (three) times daily before meals.  . metFORMIN (GLUCOPHAGE) 500 MG tablet TAKE (1) TABLET BY MOUTH TWICE A DAY WITH MEALS (BREAKFAST AND SUPPER)  . MOBIC 15 MG tablet Take 1 tablet by mouth once a day  Take once a day for 2 weeks then prn for pain after  . Multiple Vitamin (MULTIVITAMIN) tablet Take 1 tablet by mouth daily.  . potassium chloride SA (KLOR-CON) 20 MEQ tablet Take 1 tablet by mouth daily.  Marland Kitchen torsemide (DEMADEX) 20 MG tablet Take 20 mg by mouth daily.  . Vitamin D, Ergocalciferol, (DRISDOL) 1.25 MG (50000 UNIT) CAPS capsule TAKE 1 CAPSULE ONCE A WEEK  . [DISCONTINUED] Nelva Nay  SOLOSTAR 300 UNIT/ML Solostar Pen INJECT 80 UNITS SUBCUTANEOUSLY AT BEDTIME.   No facility-administered encounter medications on file as of 09/30/2020.    ALLERGIES: No Known Allergies  VACCINATION STATUS:  There is no immunization history on file for this patient.  Diabetes She presents for her follow-up diabetic visit. She has type 2 diabetes mellitus. Onset time: Diagnosed at approximate age of 66 years, after episode of gestational diabetes. Her disease course has been worsening. There are no hypoglycemic associated symptoms. Pertinent negatives for hypoglycemia include no confusion, headaches, pallor or seizures. Associated symptoms include foot paresthesias, polydipsia and polyphagia. Pertinent negatives for diabetes include no chest pain, no fatigue and no polyuria. There are no hypoglycemic complications. Symptoms are worsening (She was recently hospitalized for DKA.). Diabetic complications include nephropathy. (Diabetes  ketoacidosis.  She had acute renal failure which required brief.  Of hemodialysis during her hospitalization.) Risk factors for coronary artery disease include diabetes mellitus, dyslipidemia, hypertension, family history, obesity, sedentary lifestyle and tobacco exposure. Current diabetic treatment includes insulin injections. Her weight is fluctuating minimally. She is following a generally unhealthy diet. When asked about meal planning, she reported none. She rarely participates in exercise. Her home blood glucose trend is decreasing steadily. Her breakfast blood glucose range is generally >200 mg/dl. Her lunch blood glucose range is generally >200 mg/dl. Her dinner blood glucose range is generally >200 mg/dl. Her bedtime blood glucose range is generally >200 mg/dl. Her overall blood glucose range is >200 mg/dl. (She presents with her CGM showing only 24% time range, 76% above range no hypoglycemia.  Her point-of-care A1c is unchanged at 10.3%.    ) An ACE inhibitor/angiotensin II receptor blocker is not being taken.  Hyperlipidemia This is a chronic problem. The current episode started more than 1 year ago. The problem is uncontrolled. Exacerbating diseases include diabetes and obesity. Pertinent negatives include no chest pain, myalgias or shortness of breath. Current antihyperlipidemic treatment includes statins. Risk factors for coronary artery disease include dyslipidemia, diabetes mellitus, hypertension, family history, obesity and a sedentary lifestyle.  Hypertension This is a chronic problem. The current episode started more than 1 year ago. Pertinent negatives include no chest pain, headaches, palpitations or shortness of breath. Risk factors for coronary artery disease include diabetes mellitus, dyslipidemia, sedentary lifestyle, smoking/tobacco exposure and obesity.    Review of systems  Constitutional: + Minimally fluctuating body weight,  current  Body mass index is 49.43 kg/m. , no  fatigue, no subjective hyperthermia, no subjective hypothermia     Objective:    BP 102/78   Pulse (!) 104   Ht 5\' 1"  (1.549 m)   Wt 261 lb 9.6 oz (118.7 kg)   BMI 49.43 kg/m   Wt Readings from Last 3 Encounters:  09/30/20 261 lb 9.6 oz (118.7 kg)  06/02/20 259 lb 9.6 oz (117.8 kg)  01/07/20 253 lb 9.6 oz (115 kg)      Physical Exam- Limited  Constitutional:  Body mass index is 49.43 kg/m. , not in acute distress, normal state of mind Abdomen: Skin exam is not convincing for the amount of insulin she is given to inject.   CMP     Component Value Date/Time   NA 141 09/23/2020 0803   K 4.8 09/23/2020 0803   CL 98 09/23/2020 0803   CO2 24 09/23/2020 0803   GLUCOSE 207 (H) 09/23/2020 0803   GLUCOSE 154 (H) 12/29/2019 1004   BUN 11 09/23/2020 0803   CREATININE 0.62 09/23/2020 0803   CREATININE  0.55 12/29/2019 1004   CALCIUM 10.5 (H) 09/23/2020 0803   PROT 8.0 09/23/2020 0803   ALBUMIN 4.7 09/23/2020 0803   AST 18 09/23/2020 0803   ALT 16 09/23/2020 0803   ALKPHOS 183 (H) 09/23/2020 0803   BILITOT 0.3 09/23/2020 0803   GFRNONAA 109 12/29/2019 1004   GFRAA 127 12/29/2019 1004   Lipid Panel     Component Value Date/Time   CHOL 164 12/29/2019 1004   TRIG 75 12/29/2019 1004   HDL 49 (L) 12/29/2019 1004   CHOLHDL 3.3 12/29/2019 1004   LDLCALC 99 12/29/2019 1004     Assessment & Plan:   1. Uncontrolled type 2 diabetes mellitus with hyperglycemia ([facility])  - Lindsey Vazquez has currently uncontrolled symptomatic type 2 DM since 50 years of age.  She presents with her CGM showing only 24% time range, 76% above range no hypoglycemia.  Her point-of-care A1c is unchanged at 10.3%.   -She did not have  hypoglycemic episodes since last visit.   Recent labs reviewed, which also shows significant vitamin D deficiency.    Patient was hospitalized on February 16, 2018 for  euglycemic diabetic ketoacidosis which was associated with acute renal failure requiring brief  period of hemodialysis. -her diabetes is complicated by DKA, obesity, and sedentary life and she remains at a high risk for more acute and chronic complications which include CAD, CVA, CKD, retinopathy, and neuropathy. These are all discussed in detail with her.  - I have counseled her on diet management and weight loss, by adopting a carbohydrate restricted/protein rich diet.  - she acknowledges that there is a room for improvement in her food and drink choices. - Suggestion is made for her to avoid simple carbohydrates  from her diet including Cakes, Sweet Desserts, Ice Cream, Soda (diet and regular), Sweet Tea, Candies, Chips, Cookies, Store Bought Juices, Alcohol in Excess of  1-2 drinks a day, Artificial Sweeteners,  Coffee Creamer, and "Sugar-free" Products, Lemonade. This will help patient to have more stable blood glucose profile and potentially avoid unintended weight gain.  - I encouraged her to switch to  unprocessed or minimally processed complex starch and increased protein intake (animal or plant source), fruits, and vegetables.  - she is advised to stick to a routine mealtimes to eat 3 meals  a day and avoid unnecessary snacks ( to snack only to correct hypoglycemia).   - I have approached her with the following individualized plan to manage diabetes and patient agrees:   -Based on her current glycemic burden, recent euglycemic DKA, she will continue to need intensive treatment with basal/bolus insulin in order for her to achieve and maintain control of diabetes to target.    -She has questionable compliance to insulin administration.  In light of her presentation with significantly above target postprandial glycemic profile, she will need a higher dose of prandial insulin. -She is advised to increase Toujeo to 90 units nightly, continue Humalog 24 units 3 times a day with meals  for pre-meal BG readings of 90-150mg /dl, plus patient specific correction dose for unexpected  hyperglycemia above 150mg /dl, associated with strict monitoring of glucose 4 times a day-before meals and at bedtime. - she is warned not to take insulin without proper monitoring per orders. -Adjustment parameters are given for hypo and hyperglycemia in writing. - she is encouraged to call clinic for blood glucose levels less than 70 or above 300 mg /dl.   -Advised to stay away from SGLT2 inhibitors given her recent history  of euglycemic DKA while taking Jardiance.  - She is advised to continue Metformin 500 mg p.o. twice daily---after breakfast and after supper.   -She is advised to continue glipizide 5 mg XL p.o. daily at breakfast.      - she will be considered for incretin therapy as appropriate next visit. - Patient specific target  A1c;  LDL, HDL, Triglycerides were discussed in detail.  2) BP/HTN:   -Her blood pressure is controlled to target.    she is currently not taking any blood pressure medications.    3) Lipids/HPL: Her recent lipid panel shows controlled LDL at 82.  She is advised to continue atorvastatin 10 mg p.o. nightly.  Side effects and precautions discussed with her.  4) vitamin  D deficiency- 25 hydroxy vitamin D remains low despite a course of treatment with vitamin D3 5000 units for 90 days .she is approached for retreatment with vitamin D3 5000 units daily for the next 90 days.     5) Chronic Care/Health Maintenance:  -she  is on Statin medications and  is encouraged to initiate and continue to follow up with Ophthalmology, Dentist,  Podiatrist at least yearly or according to recommendations, and advised to   stay away from smoking. I have recommended yearly flu vaccine and pneumonia vaccine at least every 5 years; moderate intensity exercise for up to 150 minutes weekly; and  sleep for at least 7 hours a day.  - I advised patient to maintain close follow up with Renee Rival, NP for primary care needs.  - Time spent on this patient care encounter:  40  min, of which > 50% was spent in  counseling and the rest reviewing her blood glucose logs , discussing her hypoglycemia and hyperglycemia episodes, reviewing her current and  previous labs / studies  ( including abstraction from other facilities) and medications  doses and developing a  long term treatment plan and documenting her care.   Please refer to Patient Instructions for Blood Glucose Monitoring and Insulin/Medications Dosing Guide"  in media tab for additional information. Please  also refer to " Patient Self Inventory" in the Media  tab for reviewed elements of pertinent patient history.  Lindsey Vazquez participated in the discussions, expressed understanding, and voiced agreement with the above plans.  All questions were answered to her satisfaction. she is encouraged to contact clinic should she have any questions or concerns prior to her return visit.   Follow up plan: - Return in about 3 months (around 12/31/2020) for Bring Meter and Logs- A1c in Office.  Glade Lloyd, MD [facility] [address] [place], [place] [address] Phone: [phone]  Fax: [phone]    09/30/2020, 5:36 PM  This note was partially dictated with voice recognition software. Similar sounding words can be transcribed inadequately or may not  be corrected upon review.

## 2020-11-05 ENCOUNTER — Other Ambulatory Visit: Payer: Self-pay | Admitting: "Endocrinology

## 2020-11-19 ENCOUNTER — Other Ambulatory Visit: Payer: Self-pay | Admitting: "Endocrinology

## 2020-12-20 LAB — HEMOGLOBIN A1C: Hemoglobin A1C: 9.9

## 2020-12-31 ENCOUNTER — Other Ambulatory Visit: Payer: Self-pay | Admitting: "Endocrinology

## 2021-01-03 ENCOUNTER — Other Ambulatory Visit: Payer: Self-pay | Admitting: "Endocrinology

## 2021-01-04 ENCOUNTER — Encounter: Payer: Self-pay | Admitting: "Endocrinology

## 2021-01-04 ENCOUNTER — Other Ambulatory Visit: Payer: Self-pay

## 2021-01-04 ENCOUNTER — Ambulatory Visit: Payer: 59 | Admitting: "Endocrinology

## 2021-01-04 VITALS — BP 125/82 | HR 96 | Ht 61.0 in | Wt 272.8 lb

## 2021-01-04 DIAGNOSIS — I10 Essential (primary) hypertension: Secondary | ICD-10-CM

## 2021-01-04 DIAGNOSIS — E782 Mixed hyperlipidemia: Secondary | ICD-10-CM

## 2021-01-04 DIAGNOSIS — E559 Vitamin D deficiency, unspecified: Secondary | ICD-10-CM | POA: Diagnosis not present

## 2021-01-04 DIAGNOSIS — E1165 Type 2 diabetes mellitus with hyperglycemia: Secondary | ICD-10-CM | POA: Diagnosis not present

## 2021-01-04 MED ORDER — TOUJEO MAX SOLOSTAR 300 UNIT/ML ~~LOC~~ SOPN: 100.0000 [IU] | PEN_INJECTOR | Freq: Every day | SUBCUTANEOUS | 1 refills | Status: DC

## 2021-01-04 NOTE — Progress Notes (Signed)
01/04/2021, 5:53 PM                                                     Endocrinology follow-up note   Subjective:    Patient ID: Lindsey Vazquez, female    DOB: 05/02/1971.  Lindsey Vazquez is being seen in follow-up for management of currently uncontrolled symptomatic type 2 diabetes, hyperlipidemia, vitamin D deficiency. PCP:   Renee Rival, NP.   Past Medical History:  Diagnosis Date   Diabetes mellitus without complication ([place])    Hyperlipemia    Past Surgical History:  Procedure Laterality Date   c-section     Social History   Socioeconomic History   Marital status: Married    Spouse name: Not on file   Number of children: Not on file   Years of education: Not on file   Highest education level: Not on file  Occupational History   Not on file  Tobacco Use   Smoking status: Former    Pack years: 0.00    Types: Cigarettes    Quit date: 07/25/2015    Years since quitting: 5.4   Smokeless tobacco: Never  Vaping Use   Vaping Use: Former  Substance and Sexual Activity   Alcohol use: No    Comment: socially   Drug use: No   Sexual activity: Yes  Other Topics Concern   Not on file  Social History Narrative   Not on file   Social Determinants of Health   Financial Resource Strain: Not on file  Food Insecurity: Not on file  Transportation Needs: Not on file  Physical Activity: Not on file  Stress: Not on file  Social Connections: Not on file   Outpatient Encounter Medications as of 01/04/2021  Medication Sig   aspirin 81 MG EC tablet Take 81 mg by mouth daily. Swallow whole.   atorvastatin (LIPITOR) 20 MG tablet Take 0.5 tablets (10 mg total) by mouth daily.   Cholecalciferol (VITAMIN D-3) 125 MCG (5000 UT) TABS Take 5,000 Units by mouth once a week. (Patient taking differently: Take 5,000 Units by mouth 2 (two) times a week.)   Continuous Blood Gluc Sensor (FREESTYLE LIBRE 14 DAY SENSOR) MISC Inject 1 each into  the skin every 14 (fourteen) days. Use as directed.   glipiZIDE (GLUCOTROL XL) 5 MG 24 hr tablet TAKE (1) TABLET BY MOUTH DAILY WITH BREAKFAST.   HUMALOG KWIKPEN 100 UNIT/ML KwikPen INJECT 24-30 UNITS S.Q. THREE TIMES A DAY BEFORE MEALS.   ibuprofen (ADVIL,MOTRIN) 800 MG tablet Take 800 mg by mouth every 8 (eight) hours as needed.   insulin glargine, 2 Unit Dial, (TOUJEO MAX SOLOSTAR) 300 UNIT/ML Solostar Pen Inject 100 Units into the skin at bedtime.   MAGNESIUM OXIDE, ANTACID, PO Take 1 tablet by mouth daily in the afternoon.   metFORMIN (GLUCOPHAGE) 500 MG tablet TAKE (1) TABLET BY MOUTH TWICE A DAY WITH MEALS (BREAKFAST AND SUPPER)   MOBIC 15 MG tablet Take 1 tablet by mouth once a day  Take once a day for 2 weeks then prn for pain after (Patient not taking: Reported on 01/04/2021)   Multiple Vitamin (MULTIVITAMIN) tablet Take 1 tablet by mouth daily.   potassium chloride SA (KLOR-CON) 20 MEQ tablet Take 1  tablet by mouth daily.   torsemide (DEMADEX) 20 MG tablet Take 20 mg by mouth daily.   [DISCONTINUED] TOUJEO MAX SOLOSTAR 300 UNIT/ML Solostar Pen INJECT 90 UNITS S.Q. AT BEDTIME.   [DISCONTINUED] Vitamin D, Ergocalciferol, (DRISDOL) 1.25 MG (50000 UNIT) CAPS capsule TAKE 1 CAPSULE ONCE A WEEK   No facility-administered encounter medications on file as of 01/04/2021.    ALLERGIES: No Known Allergies  VACCINATION STATUS:  There is no immunization history on file for this patient.  Diabetes She presents for her follow-up diabetic visit. She has type 2 diabetes mellitus. Onset time: Diagnosed at approximate age of 4 years, after episode of gestational diabetes. Her disease course has been worsening. There are no hypoglycemic associated symptoms. Pertinent negatives for hypoglycemia include no confusion, headaches, pallor or seizures. Associated symptoms include foot paresthesias, polydipsia and polyphagia. Pertinent negatives for diabetes include no chest pain, no fatigue and no polyuria.  There are no hypoglycemic complications. Symptoms are worsening (She was recently hospitalized for DKA.). Diabetic complications include nephropathy. (Diabetes ketoacidosis.  She had acute renal failure which required brief.  Of hemodialysis during her hospitalization.) Risk factors for coronary artery disease include diabetes mellitus, dyslipidemia, hypertension, family history, obesity, sedentary lifestyle and tobacco exposure. Current diabetic treatment includes insulin injections. Her weight is increasing steadily. She is following a generally unhealthy diet. When asked about meal planning, she reported none. She rarely participates in exercise. Her home blood glucose trend is decreasing steadily. Her breakfast blood glucose range is generally >200 mg/dl. Her lunch blood glucose range is generally >200 mg/dl. Her dinner blood glucose range is generally >200 mg/dl. Her bedtime blood glucose range is generally >200 mg/dl. Her overall blood glucose range is >200 mg/dl. (She presents with her CGM.  AGP report shows 30% TIR, 69% TAR, no major hypoglycemia.  Her average blood glucose is 230.  Her Dec 20, 2020 A1c was 9.9% slightly improving from 10.3% during her last visit.     ) An ACE inhibitor/angiotensin II receptor blocker is not being taken.  Hyperlipidemia This is a chronic problem. The current episode started more than 1 year ago. The problem is uncontrolled. Exacerbating diseases include diabetes and obesity. Pertinent negatives include no chest pain, myalgias or shortness of breath. Current antihyperlipidemic treatment includes statins. Risk factors for coronary artery disease include dyslipidemia, diabetes mellitus, hypertension, family history, obesity and a sedentary lifestyle.  Hypertension This is a chronic problem. The current episode started more than 1 year ago. Pertinent negatives include no chest pain, headaches, palpitations or shortness of breath. Risk factors for coronary artery disease  include diabetes mellitus, dyslipidemia, sedentary lifestyle, smoking/tobacco exposure and obesity.   Review of systems  Constitutional: + Minimally fluctuating body weight,  current  Body mass index is 51.55 kg/m. , no fatigue, no subjective hyperthermia, no subjective hypothermia     Objective:    BP 125/82   Pulse 96   Ht 5\' 1"  (1.549 m)   Wt 272 lb 12.8 oz (123.7 kg)   BMI 51.55 kg/m   Wt Readings from Last 3 Encounters:  01/04/21 272 lb 12.8 oz (123.7 kg)  09/30/20 261 lb 9.6 oz (118.7 kg)  06/02/20 259 lb 9.6 oz (117.8 kg)      Physical Exam- Limited  Constitutional:  Body mass index is 51.55 kg/m. , not in acute distress, normal state of mind Abdomen: Skin exam is not convincing for the amount of insulin she is given to inject.   CMP  Component Value Date/Time   NA 141 09/23/2020 0803   K 4.8 09/23/2020 0803   CL 98 09/23/2020 0803   CO2 24 09/23/2020 0803   GLUCOSE 207 (H) 09/23/2020 0803   GLUCOSE 154 (H) 12/29/2019 1004   BUN 11 09/23/2020 0803   CREATININE 0.62 09/23/2020 0803   CREATININE 0.55 12/29/2019 1004   CALCIUM 10.5 (H) 09/23/2020 0803   PROT 8.0 09/23/2020 0803   ALBUMIN 4.7 09/23/2020 0803   AST 18 09/23/2020 0803   ALT 16 09/23/2020 0803   ALKPHOS 183 (H) 09/23/2020 0803   BILITOT 0.3 09/23/2020 0803   GFRNONAA 109 12/29/2019 1004   GFRAA 127 12/29/2019 1004   Lipid Panel     Component Value Date/Time   CHOL 164 12/29/2019 1004   TRIG 75 12/29/2019 1004   HDL 49 (L) 12/29/2019 1004   CHOLHDL 3.3 12/29/2019 1004   LDLCALC 99 12/29/2019 1004     Assessment & Plan:   1. Uncontrolled type 2 diabetes mellitus with hyperglycemia ([facility])  - Daijah G Vandewater has currently uncontrolled symptomatic type 2 DM since 50 years of age.  She presents with her CGM.  AGP report shows 30% TIR, 69% TAR, no major hypoglycemia.  Her average blood glucose is 230.  Her Dec 20, 2020 A1c was 9.9% slightly improving from 10.3% during her last  visit.   -She did not have  hypoglycemic episodes since last visit.   Recent labs reviewed, which also shows significant vitamin D deficiency.    Patient was hospitalized on February 16, 2018 for  euglycemic diabetic ketoacidosis which was associated with acute renal failure requiring brief period of hemodialysis. -her diabetes is complicated by DKA, obesity, and sedentary life and she remains at a high risk for more acute and chronic complications which include CAD, CVA, CKD, retinopathy, and neuropathy. These are all discussed in detail with her.  - I have counseled her on diet management and weight loss, by adopting a carbohydrate restricted/protein rich diet.   - she acknowledges that there is a room for improvement in her food and drink choices. - Suggestion is made for her to avoid simple carbohydrates  from her diet including Cakes, Sweet Desserts, Ice Cream, Soda (diet and regular), Sweet Tea, Candies, Chips, Cookies, Store Bought Juices, Alcohol in Excess of  1-2 drinks a day, Artificial Sweeteners,  Coffee Creamer, and "Sugar-free" Products, Lemonade. This will help patient to have more stable blood glucose profile and potentially avoid unintended weight gain.   - I encouraged her to switch to  unprocessed or minimally processed complex starch and increased protein intake (animal or plant source), fruits, and vegetables.  - she is advised to stick to a routine mealtimes to eat 3 meals  a day and avoid unnecessary snacks ( to snack only to correct hypoglycemia).   - I have approached her with the following individualized plan to manage diabetes and patient agrees:   -Based on her current glycemic burden, recent euglycemic DKA, she will continue to need intensive treatment with basal/bolus insulin in order for her to achieve and maintain control of diabetes to target.    -She has questionable compliance to the insulin administration.  In light of her presentation with continued glycemic  burden, she will be considered for higher dose of basal insulin.  I discussed and increase her Toujeo to 100 units nightly, continue Humalog  24 units 3 times a day with meals  for pre-meal BG readings of 90-150mg /dl, plus patient specific correction  dose for unexpected hyperglycemia above 150mg /dl, associated with strict monitoring of glucose 4 times a day-before meals and at bedtime. - she is warned not to take insulin without proper monitoring per orders. -Adjustment parameters are given for hypo and hyperglycemia in writing. - she is encouraged to call clinic for blood glucose levels less than 70 or above 300 mg /dl.   -Advised to stay away from SGLT2 inhibitors given her recent history of euglycemic DKA while taking Jardiance.  - She is a advised to continue metformin 500 mg p.o. twice daily-daily after breakfast and after supper.   -She is advised to continue glipizide 5 mg XL p.o. daily at breakfast.      - she will be considered for incretin therapy as appropriate next visit. - Patient specific target  A1c;  LDL, HDL, Triglycerides were discussed in detail.  2) BP/HTN:   -Her blood pressure is controlled to target.    she is currently not taking any blood pressure medications.    3) Lipids/HPL: Her recent lipid panel shows controlled LDL at 82.  She is advised to continue atorvastatin 10 mg p.o. nightly.  Side effects and precautions discussed with her.  4) vitamin  D deficiency- 25 hydroxy vitamin D remains low despite a course of treatment with vitamin D3 5000 units for 90 days .she is approached for retreatment with vitamin D3 5000 units daily for the next 90 days.    5) weight management: Her BMI is 51.5.  She has gained weight progressively.  She is currently being explored for bariatric surgery.  She would benefit greatly from this procedure.  She was observed to have mild hypercalcemia associated with elevated PTH.  She will need work-up and visit to clear etiology before her  surgery.  6) hypercalcemia: Associated with high PTH.  Possible primary hyperparathyroidism.  I ordered 24-hour urine collection for calcium.  She will return in 10-15 days to discuss her results and will be considered for treatment  if necessary.  6) Chronic Care/Health Maintenance:  -she  is on Statin medications and  is encouraged to initiate and continue to follow up with Ophthalmology, Dentist,  Podiatrist at least yearly or according to recommendations, and advised to   stay away from smoking. I have recommended yearly flu vaccine and pneumonia vaccine at least every 5 years; moderate intensity exercise for up to 150 minutes weekly; and  sleep for at least 7 hours a day.  - I advised patient to maintain close follow up with Renee Rival, NP for primary care needs.    I spent 45 minutes in the care of the patient today including review of labs from [place], Lipids, Thyroid Function, Hematology (current and previous including abstractions from other facilities); face-to-face time discussing  her blood glucose readings/logs, discussing hypoglycemia and hyperglycemia episodes and symptoms, medications doses, her options of short and long term treatment based on the latest standards of care / guidelines;  discussion about incorporating lifestyle medicine;  and documenting the encounter.    Please refer to Patient Instructions for Blood Glucose Monitoring and Insulin/Medications Dosing Guide"  in media tab for additional information. Please  also refer to " Patient Self Inventory" in the Media  tab for reviewed elements of pertinent patient history.  Lindsey Vazquez participated in the discussions, expressed understanding, and voiced agreement with the above plans.  All questions were answered to her satisfaction. she is encouraged to contact clinic should she have any questions or concerns prior to her  return visit.   Follow up plan: - Return in about 2 weeks (around 01/18/2021), or logs  and meter, for [place], MD [facility] [address] [place], [place] [address] Phone: [phone]  Fax: [phone]    01/04/2021, 5:53 PM  This note was partially dictated with voice recognition software. Similar sounding words can be transcribed inadequately or may not  be corrected upon review.

## 2021-01-04 NOTE — Patient Instructions (Signed)

## 2021-01-11 ENCOUNTER — Encounter (INDEPENDENT_AMBULATORY_CARE_PROVIDER_SITE_OTHER): Payer: 59 | Admitting: Internal Medicine

## 2021-01-11 DIAGNOSIS — G4719 Other hypersomnia: Secondary | ICD-10-CM

## 2021-01-11 DIAGNOSIS — G4733 Obstructive sleep apnea (adult) (pediatric): Secondary | ICD-10-CM | POA: Diagnosis not present

## 2021-01-11 LAB — CREATININE, URINE, 24 HOUR
Creatinine, 24H Ur: 1185 mg/24 hr (ref 800–1800)
Creatinine, Urine: 39.5 mg/dL

## 2021-01-11 LAB — CALCIUM, URINE, 24 HOUR
Calcium, 24H Urine: 210 mg/24 hr (ref 0–320)
Calcium, Urine: 7 mg/dL

## 2021-01-14 DIAGNOSIS — G4733 Obstructive sleep apnea (adult) (pediatric): Secondary | ICD-10-CM | POA: Insufficient documentation

## 2021-01-14 DIAGNOSIS — G4719 Other hypersomnia: Secondary | ICD-10-CM | POA: Insufficient documentation

## 2021-01-14 NOTE — Procedures (Signed)
[facility] Report Part I                                                                 Phone: [phone] Fax: [phone]  Patient Name: Lindsey Vazquez, Lindsey Vazquez Acquisition Number: [id number]  Date of Birth: [birth year] Acquisition Date: 01/11/2021  Referring Physician: Emelda Brothers, MD     History: The patient is a 49 year old female who was referred for evaluation of possible sleep apnea. Medical History: diabetes, hyperlipidemia, leukocytosis, chronic pain, anemia, arthritis.  Medications: cholecalciferol, duloxetine, glipizide, metformin, meloxicam, potassium chloride, torsemide, atorvastatin, vitamin D .  Procedure: This routine overnight polysomnogram was performed on the [facility] 5 using the standard diagnostic protocol. This included 6 channels of EEG, 2 channels of EOG, chin EMG, bilateral anterior tibialis EMG, nasal/oral thermistor, PTAF (nasal pressure transducer), chest and abdominal wall movements, EKG, and pulse oximetry.  Description: The total recording time was 402.3 minutes. The total sleep time was 352.9 minutes. There were a total of 37.5 minutes of wakefulness after sleep onset for a reducedsleep efficiency of 87.7%. The latency to sleep onset was within normal limits at 11.9 minutes. The R sleep onset latency was very short at 12.5 minutes. Sleep parameters, as a percentage of the total sleep time, demonstrated 9.5% of sleep was in N1 sleep, 71.9% N2, 7.1% N3 and 11.5% R sleep. There were a total of 315 arousals for an arousal index of 53.6 arousals per hour of sleep that was elevated.  Respiratory monitoring demonstrated nearly continuous moderate degree of snoring in all positions. There were 241 apneas and hypopneas for an Apnea Hypopnea Index of 41.0 apneas and hypopneas per hour of sleep. The REM related apnea hypopnea index was 66.7/hr of REM sleep compared to a NREM AHI of 37.6/hr. The Respiratory Disturbance Index, which includes 93  respiratory effort related arousals (RERAs), was 56.8 respiratory events per hour of sleep.  The average duration of the respiratory events was 22.5 seconds with a maximum duration of 50.5 seconds. The respiratory events occurred in all positions, however they were more frequent in the supine position with an AHI of 77.2. The respiratory events were associated with peripheral oxygen desaturations on the average to 93%. The lowest oxygen desaturation associated with a respiratory event was 84%. Additionally, the baseline oxygen saturation during wakefulness was 97%, during NREM sleep averaged 96%, and during REM sleep averaged  95%. The total duration of oxygen < 90% was 2.3 minutes.  Cardiac monitoring- did not demonstrate transient cardiac decelerations associated with the apneas. There were no significant cardiac rhythm irregularities. The heart rate averaged 98 prior to sleep onset and remained above 100 throughout the study.  Periodic limb movement monitoring- demonstrated that there were 23 periodic limb movements for a periodic limb movement index of 3.9 periodic limb movements per hour of sleep.   Impression: This routine overnight polysomnogram demonstrated severe obstructive sleep apnea with an overall Apnea Hypopnea Index of 41.0 apneas and hypopneas per hour of sleep, which increased to 66.7 in REM and 77.3 in supine sleep. As REM percentage was reduced, the findings likely underestimate the severity of the sleep apnea.  There were few periodic limb movements that commonly are not significant. Clinical correlation would be suggested.  There was a reduced  sleep efficiency with an elevated arousal index,increased awakeningsand reduced percentages of REM and slow wave sleep.These findings would appear to be due to the obstructive sleep apnea.  Recommendations:    A CPAP titration would be recommended due to the severity of the sleep apnea. Some supine sleep should be ensured to optimize the  titration. Additionally, would recommend weight loss in a patient with a BMI of 49.1.     Allyne Gee, MD, [facility] Diplomate ABMS-Pulmonary, Critical Care and Sleep Medicine  Electronically reviewed and digitally signed      [facility] Report Part II  Phone: [phone] Fax: [phone]  Patient last name Stamour Neck Size 15.0 in. Acquisition [phone]  Patient first name Lindsey Vazquez Weight 260.0 lbs. Started 01/11/2021 at 10:12:21 PM  Birth date 27-Jun-1971 Height 61.0 in. Stopped 01/12/2021 at 5:01:57 AM  Age 85 BMI 49.1 lb/in2 Duration 402.3  Study Type Adult      Report generated by: Adriana Mccallum, RPSGT Sleep Data: Lights Out: 10:18:27 PM Sleep Onset: 10:30:21 PM  Lights On: 5:00:45 AM Sleep Efficiency: 87.7 %  Total Recording Time: 402.3 min Sleep Latency (from Lights Off) 11.9 min  Total Sleep Time (TST): 352.9 min R Latency (from Sleep Onset): 12.5 min  Sleep Period Time: 390.4 min Total number of awakenings: 21  Wake during sleep: 37.5 min Wake After Sleep Onset (WASO): 37.5 min   Sleep Data:         Arousal Summary: Stage  Latency from lights out (min) Latency from sleep onset (min) Duration (min) % Total Sleep Time  Normal values  N 1 11.9 0.0 33.5 9.5 (5%)  N 2 12.4 0.5 253.9 71.9 (50%)  N 3 32.4 20.5 25.0 7.1 (20%)  R 24.4 12.5 40.5 11.5 (25%)    Number Index  Spontaneous 149 25.3  Apneas & Hypopneas 167 28.4  RERAs 93 15.8       (Apneas & Hypopneas & RERAs)  (260) (44.2)  Limb Movement 6 1.0  Snore 0 0.0  TOTAL 415 70.6     Respiratory Data:  CA OA MA Apnea Hypopnea* A+ H RERA Total  Number 0 6 1 7  234 241 93 334  Mean Dur (sec) 0.0 13.4 21.0 14.5 23.5 23.2 20.8 22.5  Max Dur (sec) 0.0 18.5 21.0 21.0 50.5 50.5 41.0 50.5  Total Dur (min) 0.0 1.3 0.3 1.7 91.5 93.2 32.2 125.4  % of TST 0.0 0.4 0.1 0.5 25.9 26.4 9.1 35.5  Index (#/h TST) 0.0 1.0 0.2 1.2 39.8 41.0 15.8 56.8  *Hypopneas scored based on 4% or greater  desaturation.  Sleep Stage:        REM NREM TST  AHI 66.7 37.6 41.0  RDI 68.1 55.3 56.8           Body Position Data:  Sleep (min) TST (%) REM (min) NREM (min) CA (#) OA (#) MA (#) HYP (#) AHI (#/h) RERA (#) RDI (#/h) Desat (#)  Supine 96.4 27.32 0.0 96.4 0 1 1 122 77.2 15 86.5 149  Non-Supine 256.50 72.68 40.50 216.00 0.00 5.00 0.00 112.00 27.37 78.00 45.61 184.00  Right: 256.5 72.68 40.5 216.0 0 5 0 112 27.4 78 45.6 184     Snoring: Total number of snoring episodes  0  Total time with snoring    min (   % of sleep)   Oximetry Distribution:             WK  REM NREM TOTAL  Average (%)   97 95 96 96  < 90% 0.0 1.9 0.4 2.3  < 80% 0.0 0.0 0.0 0.0  < 70% 0.0 0.0 0.0 0.0  # of Desaturations* 7 49 277 333  Desat Index (#/hour) 8.5 72.6 53.2 56.6  Desat Max (%) 6 13 10 13   Desat Max Dur (sec) 31.0 62.0 76.0 76.0  Approx Min O2 during sleep 84  Approx min O2 during a respiratory event 84  Was Oxygen added (Y/N) and final rate No:   0 LPM  *Desaturations based on 3% or greater drop from baseline.   Cheyne Stokes Breathing: None Present   Heart Rate Summary:  Average Heart Rate During Sleep 101.5 bpm      Highest Heart Rate During Sleep (95th %) 110.0 bpm      Highest Heart Rate During Sleep 155 bpm (artifact)  Highest Heart Rate During Recording (TIB) 186 bpm (artifact)   Heart Rate Observations: Event Type # Events   Bradycardia 0 Lowest HR Scored: N/A  Sinus Tachycardia During Sleep 0 Highest HR Scored: N/A  Narrow Complex Tachycardia 0 Highest HR Scored: N/A  Wide Complex Tachycardia 0 Highest HR Scored: N/A  Asystole 0 Longest Pause: N/A  Atrial Fibrillation 0 Duration Longest Event: N/A  Other Arrythmias  No Type:    Periodic Limb Movement Data: (Primary legs unless otherwise noted) Total # Limb Movement 26 Limb Movement Index 4.4  Total # PLMS 23 PLMS Index 3.9  Total # PLMS Arousals 5 PLMS Arousal Index 0.9  Percentage Sleep Time with PLMS  14.70min (4.2 % sleep)  Mean Duration limb movements (secs) 220.6

## 2021-01-18 ENCOUNTER — Encounter: Payer: Self-pay | Admitting: "Endocrinology

## 2021-01-18 ENCOUNTER — Other Ambulatory Visit: Payer: Self-pay

## 2021-01-18 ENCOUNTER — Ambulatory Visit: Payer: 59 | Admitting: "Endocrinology

## 2021-01-18 VITALS — BP 114/70 | HR 96 | Ht 61.0 in | Wt 267.8 lb

## 2021-01-18 DIAGNOSIS — E559 Vitamin D deficiency, unspecified: Secondary | ICD-10-CM

## 2021-01-18 DIAGNOSIS — I10 Essential (primary) hypertension: Secondary | ICD-10-CM | POA: Diagnosis not present

## 2021-01-18 DIAGNOSIS — E1165 Type 2 diabetes mellitus with hyperglycemia: Secondary | ICD-10-CM

## 2021-01-18 DIAGNOSIS — E782 Mixed hyperlipidemia: Secondary | ICD-10-CM | POA: Diagnosis not present

## 2021-01-18 MED ORDER — TOUJEO MAX SOLOSTAR 300 UNIT/ML ~~LOC~~ SOPN: 90.0000 [IU] | PEN_INJECTOR | Freq: Every day | SUBCUTANEOUS | 1 refills | Status: DC

## 2021-01-18 MED ORDER — INSULIN LISPRO (1 UNIT DIAL) 100 UNIT/ML (KWIKPEN): 26.0000 [IU] | PEN_INJECTOR | Freq: Three times a day (TID) | SUBCUTANEOUS | 2 refills | Status: DC

## 2021-01-18 NOTE — Progress Notes (Signed)
01/18/2021, 4:52 PM                                                     Endocrinology follow-up note   Subjective:    Patient ID: Lindsey Vazquez, female    DOB: Oct 06, 1970.  Lindsey Vazquez is being seen in follow-up for management of currently uncontrolled symptomatic type 2 diabetes, hyperlipidemia, vitamin D deficiency. PCP:   Renee Rival, NP.   Past Medical History:  Diagnosis Date   Diabetes mellitus without complication ([place])    Hyperlipemia    Past Surgical History:  Procedure Laterality Date   c-section     Social History   Socioeconomic History   Marital status: Married    Spouse name: Not on file   Number of children: Not on file   Years of education: Not on file   Highest education level: Not on file  Occupational History   Not on file  Tobacco Use   Smoking status: Former    Pack years: 0.00    Types: Cigarettes    Quit date: 07/25/2015    Years since quitting: 5.4   Smokeless tobacco: Never  Vaping Use   Vaping Use: Former  Substance and Sexual Activity   Alcohol use: No    Comment: socially   Drug use: No   Sexual activity: Yes  Other Topics Concern   Not on file  Social History Narrative   Not on file   Social Determinants of Health   Financial Resource Strain: Not on file  Food Insecurity: Not on file  Transportation Needs: Not on file  Physical Activity: Not on file  Stress: Not on file  Social Connections: Not on file   Outpatient Encounter Medications as of 01/18/2021  Medication Sig   aspirin 81 MG EC tablet Take 81 mg by mouth daily. Swallow whole.   atorvastatin (LIPITOR) 20 MG tablet Take 0.5 tablets (10 mg total) by mouth daily.   Cholecalciferol (VITAMIN D-3) 125 MCG (5000 UT) TABS Take 5,000 Units by mouth once a week. (Patient taking differently: Take 5,000 Units by mouth 2 (two) times a week.)   Continuous Blood Gluc Sensor (FREESTYLE LIBRE 14 DAY SENSOR) MISC Inject 1 each into  the skin every 14 (fourteen) days. Use as directed.   glipiZIDE (GLUCOTROL XL) 5 MG 24 hr tablet TAKE (1) TABLET BY MOUTH DAILY WITH BREAKFAST.   ibuprofen (ADVIL,MOTRIN) 800 MG tablet Take 800 mg by mouth every 8 (eight) hours as needed.   insulin glargine, 2 Unit Dial, (TOUJEO MAX SOLOSTAR) 300 UNIT/ML Solostar Pen Inject 90 Units into the skin at bedtime.   insulin lispro (HUMALOG KWIKPEN) 100 UNIT/ML KwikPen Inject 26-30 Units into the skin 3 (three) times daily before meals.   MAGNESIUM OXIDE, ANTACID, PO Take 1 tablet by mouth daily in the afternoon.   metFORMIN (GLUCOPHAGE) 500 MG tablet TAKE (1) TABLET BY MOUTH TWICE A DAY WITH MEALS (BREAKFAST AND SUPPER)   Multiple Vitamin (MULTIVITAMIN) tablet Take 1 tablet by mouth daily.   potassium chloride SA (KLOR-CON) 20 MEQ tablet Take 1 tablet by mouth daily.   torsemide (DEMADEX) 20 MG tablet Take 20 mg by mouth daily.   [DISCONTINUED] HUMALOG KWIKPEN 100 UNIT/ML KwikPen INJECT 24-30 UNITS S.Q.  THREE TIMES A DAY BEFORE MEALS.   [DISCONTINUED] insulin glargine, 2 Unit Dial, (TOUJEO MAX SOLOSTAR) 300 UNIT/ML Solostar Pen Inject 100 Units into the skin at bedtime.   [DISCONTINUED] MOBIC 15 MG tablet Take 1 tablet by mouth once a day  Take once a day for 2 weeks then prn for pain after (Patient not taking: Reported on 01/04/2021)   No facility-administered encounter medications on file as of 01/18/2021.    ALLERGIES: No Known Allergies  VACCINATION STATUS:  There is no immunization history on file for this patient.  Diabetes She presents for her follow-up diabetic visit. She has type 2 diabetes mellitus. Onset time: Diagnosed at approximate age of 64 years, after episode of gestational diabetes. Her disease course has been worsening. There are no hypoglycemic associated symptoms. Pertinent negatives for hypoglycemia include no confusion, headaches, pallor or seizures. Associated symptoms include foot paresthesias, polydipsia and polyphagia.  Pertinent negatives for diabetes include no chest pain, no fatigue and no polyuria. There are no hypoglycemic complications. Symptoms are worsening (She was recently hospitalized for DKA.). Diabetic complications include nephropathy. (Diabetes ketoacidosis.  She had acute renal failure which required brief.  Of hemodialysis during her hospitalization.) Risk factors for coronary artery disease include diabetes mellitus, dyslipidemia, hypertension, family history, obesity, sedentary lifestyle and tobacco exposure. Current diabetic treatment includes insulin injections. Her weight is increasing steadily. She is following a generally unhealthy diet. When asked about meal planning, she reported none. She rarely participates in exercise. Her home blood glucose trend is fluctuating dramatically. Her breakfast blood glucose range is generally 180-200 mg/dl. Her lunch blood glucose range is generally >200 mg/dl. Her dinner blood glucose range is generally >200 mg/dl. Her bedtime blood glucose range is generally >200 mg/dl. Her overall blood glucose range is >200 mg/dl. (She presents with her logs.  She has fluctuating glycemic profile at fasting, continue to have severe postprandial hyperglycemia.  Her recent A1c was 9.9% in May 2022. ) An ACE inhibitor/angiotensin II receptor blocker is not being taken.  Hyperlipidemia This is a chronic problem. The current episode started more than 1 year ago. The problem is uncontrolled. Exacerbating diseases include diabetes and obesity. Pertinent negatives include no chest pain, myalgias or shortness of breath. Current antihyperlipidemic treatment includes statins. Risk factors for coronary artery disease include dyslipidemia, diabetes mellitus, hypertension, family history, obesity and a sedentary lifestyle.  Hypertension This is a chronic problem. The current episode started more than 1 year ago. Pertinent negatives include no chest pain, headaches, palpitations or shortness of  breath. Risk factors for coronary artery disease include diabetes mellitus, dyslipidemia, sedentary lifestyle, smoking/tobacco exposure and obesity.   Review of systems  Constitutional: + Minimally fluctuating body weight,  current  Body mass index is 50.6 kg/m. , no fatigue, no subjective hyperthermia, no subjective hypothermia     Objective:    BP 114/70   Pulse 96   Ht 5\' 1"  (1.549 m)   Wt 267 lb 12.8 oz (121.5 kg)   BMI 50.60 kg/m   Wt Readings from Last 3 Encounters:  01/18/21 267 lb 12.8 oz (121.5 kg)  01/04/21 272 lb 12.8 oz (123.7 kg)  09/30/20 261 lb 9.6 oz (118.7 kg)      Physical Exam- Limited  Constitutional:  Body mass index is 50.6 kg/m. , not in acute distress, normal state of mind Abdomen: Skin exam is not convincing for the amount of insulin she is given to inject.   CMP     Component Value Date/Time  NA 141 09/23/2020 0803   K 4.8 09/23/2020 0803   CL 98 09/23/2020 0803   CO2 24 09/23/2020 0803   GLUCOSE 207 (H) 09/23/2020 0803   GLUCOSE 154 (H) 12/29/2019 1004   BUN 11 09/23/2020 0803   CREATININE 0.62 09/23/2020 0803   CREATININE 0.55 12/29/2019 1004   CALCIUM 10.5 (H) 09/23/2020 0803   PROT 8.0 09/23/2020 0803   ALBUMIN 4.7 09/23/2020 0803   AST 18 09/23/2020 0803   ALT 16 09/23/2020 0803   ALKPHOS 183 (H) 09/23/2020 0803   BILITOT 0.3 09/23/2020 0803   GFRNONAA 109 12/29/2019 1004   GFRAA 127 12/29/2019 1004   Lipid Panel     Component Value Date/Time   CHOL 164 12/29/2019 1004   TRIG 75 12/29/2019 1004   HDL 49 (L) 12/29/2019 1004   CHOLHDL 3.3 12/29/2019 1004   LDLCALC 99 12/29/2019 1004     Assessment & Plan:   1. Uncontrolled type 2 diabetes mellitus with hyperglycemia ([facility])  - Lindsey Vazquez has currently uncontrolled symptomatic type 2 DM since 50 years of age.  She presents with her logs.  She has fluctuating glycemic profile at fasting, continue to have severe postprandial hyperglycemia.  Her recent A1c was  9.9% in May 2022.  -She did not have  hypoglycemic episodes since last visit.   Recent labs reviewed, which also shows significant vitamin D deficiency.    Patient was hospitalized on February 16, 2018 for  euglycemic diabetic ketoacidosis which was associated with acute renal failure requiring brief period of hemodialysis. -her diabetes is complicated by DKA, obesity, and sedentary life and she remains at a high risk for more acute and chronic complications which include CAD, CVA, CKD, retinopathy, and neuropathy. These are all discussed in detail with her.  - I have counseled her on diet management and weight loss, by adopting a carbohydrate restricted/protein rich diet.  - she acknowledges that there is a room for improvement in her food and drink choices. - Suggestion is made for her to avoid simple carbohydrates  from her diet including Cakes, Sweet Desserts, Ice Cream, Soda (diet and regular), Sweet Tea, Candies, Chips, Cookies, Store Bought Juices, Alcohol in Excess of  1-2 drinks a day, Artificial Sweeteners,  Coffee Creamer, and "Sugar-free" Products, Lemonade. This will help patient to have more stable blood glucose profile and potentially avoid unintended weight gain.   - I encouraged her to switch to  unprocessed or minimally processed complex starch and increased protein intake (animal or plant source), fruits, and vegetables.  - she is advised to stick to a routine mealtimes to eat 3 meals  a day and avoid unnecessary snacks ( to snack only to correct hypoglycemia).   - I have approached her with the following individualized plan to manage diabetes and patient agrees:   -Based on her current glycemic burden, recent euglycemic DKA, she will continue to need intensive treatment with basal/bolus insulin in order for her to achieve and maintain control of diabetes to target.    -She has questionable compliance to the insulin administration.  In light of her presentation with continued  glycemic burden, she will be considered for higher dose of basal insulin.   I discussed and lowered her Toujeo to 90 units nightly, increase Humalog to 26 units 3 times a day with meals  for pre-meal BG readings of 90-150mg /dl, plus patient specific correction dose for unexpected hyperglycemia above 150mg /dl, associated with strict monitoring of glucose 4 times a day-before meals  and at bedtime. - she is warned not to take insulin without proper monitoring per orders. -Adjustment parameters are given for hypo and hyperglycemia in writing. - she is encouraged to call clinic for blood glucose levels less than 70 or above 300 mg /dl.   -Advised to stay away from SGLT2 inhibitors given her recent history of euglycemic DKA while taking Jardiance.  - She is a advised to continue metformin 500 mg p.o. twice daily-daily after breakfast and after supper.   -She is advised to continue glipizide 5 mg XL p.o. daily at breakfast.    - she will be considered for incretin therapy as appropriate next visit. - Patient specific target  A1c;  LDL, HDL, Triglycerides were discussed in detail.  2) BP/HTN:   -Her blood pressure is controlled to target.    she is currently not taking any blood pressure medications.    3) Lipids/HPL: Her recent lipid panel shows controlled LDL at 82.  She is advised to continue atorvastatin 10 mg p.o. nightly.  Side effects and precautions discussed with her.  4) vitamin  D deficiency- 25 hydroxy vitamin D remains low despite a course of treatment with vitamin D3 5000 units for 90 days .she is approached for retreatment with vitamin D3 5000 units daily for the next 90 days.    5) weight management: Her BMI is 51.5.  She has gained weight progressively.  She is currently being explored for bariatric surgery.  She would benefit greatly from this procedure.    6) hypercalcemia: Associated with high PTH.  Her 24-hour urine calcium is not elevated-2010.  Still possible for mild early  primary hyperparathyroidism.  She will not need acute intervention right now.  She will have repeat PTH /calcium during her subsequent labs.     6) Chronic Care/Health Maintenance:  -she  is on Statin medications and  is encouraged to initiate and continue to follow up with Ophthalmology, Dentist,  Podiatrist at least yearly or according to recommendations, and advised to   stay away from smoking. I have recommended yearly flu vaccine and pneumonia vaccine at least every 5 years; moderate intensity exercise for up to 150 minutes weekly; and  sleep for at least 7 hours a day.  - I advised patient to maintain close follow up with Renee Rival, NP for primary care needs.     I spent 32 minutes in the care of the patient today including review of labs from [facility], Lipids, Thyroid Function, Hematology (current and previous including abstractions from other facilities); face-to-face time discussing  her blood glucose readings/logs, discussing hypoglycemia and hyperglycemia episodes and symptoms, medications doses, her options of short and long term treatment based on the latest standards of care / guidelines;  discussion about incorporating lifestyle medicine;  and documenting the encounter.    Please refer to Patient Instructions for Blood Glucose Monitoring and Insulin/Medications Dosing Guide"  in media tab for additional information. Please  also refer to " Patient Self Inventory" in the Media  tab for reviewed elements of pertinent patient history.  Lindsey Vazquez in the discussions, expressed understanding, and voiced agreement with the above plans.  All questions were answered to her satisfaction. she is encouraged to contact clinic should she have any questions or concerns prior to her return visit.    Follow up plan: - Return in about 9 weeks (around 03/22/2021) for Bring Meter and Logs- A1c in Office.  Glade Lloyd, MD [facility]  [address] [place], [place] [address] Phone: [phone]  Fax: [phone]    01/18/2021, 4:52 PM  This note was partially dictated with voice recognition software. Similar sounding words can be transcribed inadequately or may not  be corrected upon review.

## 2021-01-18 NOTE — Patient Instructions (Signed)

## 2021-02-05 ENCOUNTER — Other Ambulatory Visit: Payer: Self-pay | Admitting: "Endocrinology

## 2021-02-14 ENCOUNTER — Encounter (INDEPENDENT_AMBULATORY_CARE_PROVIDER_SITE_OTHER): Payer: 59 | Admitting: Internal Medicine

## 2021-02-14 DIAGNOSIS — G4733 Obstructive sleep apnea (adult) (pediatric): Secondary | ICD-10-CM | POA: Diagnosis not present

## 2021-02-17 NOTE — Procedures (Signed)
[facility] Report Part I  Phone: [phone] Fax: [phone]  Patient Name: Lindsey Vazquez, Lindsey Vazquez Acquisition Number: [id number]  Date of Birth: May 05, 1971 Acquisition Date: 02/14/2021  Referring Physician: Emelda Brothers, MD     History: The patient is a 50 year old female with obstructive sleep apnea for CPAP titration. Medical History: diabetes, hyperlipidemia, leukocytosis, chronic pain, anemia, arthritis.  Medications: cholecalciferol, duloxetine, glipizide, metformin, meloxicam, potassium chloride, torsemide, atorvastatin, vitamin D.  Procedure: This routine overnight polysomnogram was performed on the [place] 4 or 5 using the standard CPAP/BIPAP protocol. This included 6 channels of EEG, 2 channels of EOG, chin EMG, bilateral anterior tibialis EMG, nasal/oral thermister, PTAF (nasal pressure transducer), chest and abdominal wall movements, EKG, and pulse oximetry.  Description: The total recording time was 388.7 minutes. The total sleep time was 364.5 minutes. There were a total of 18.8 minutes of wakefulness after sleep onset for a goodsleep efficiency of 93.8%. The latency to sleep onset was short at 5.4 minutes. The R sleep onset latency was within normal limits at 96.0 minutes. Sleep parameters, as a percentage of the total sleep time, demonstrated 2.5% of sleep was in N1 sleep, 64.9% N2, 8.2% N3 and 24.4% R sleep. There were a total of 47 arousals for an arousal index of 7.7 arousals per hour of sleep that was normal.  Overall, there were a total of 142 respiratory events for a respiratory disturbance index, which includes apneas, hypopneas and RERAs (increased respiratory effort) of 23.4 respiratory events per hour of sleep during the pressure titration. CPAP was initiated at  cm of H2O at lights out, 10:37:43 PM. It was titrated in 1-2 cm increments to the final pressure of  cm of H2O.   Additionally, the baseline oxygen saturation during  wakefulness was 97%, during NREM sleep averaged 96%, and during REM sleep averaged 96%. The total duration of oxygen < 90% was 0.3 minutes.  Cardiac monitoring- did not demonstrate transient cardiac decelerations associated with the apneas. There were no significant cardiac rhythm irregularities.   Periodic limb movement monitoring- did not demonstrate periodic limb movements.   Impression: This patient's obstructive sleep apnea demonstrated significant improvement with the utilization of nasal CPAP.      Recommendations: Would recommend utilization of nasal CPAP at 17 cm of H2O.     A  resmed P10 mask, size medium, was used.     Allyne Gee, MD [facility] Diplomate ABMS Pulmonary Critical Care Medicine Sleep Medicine Electronically reviewed and digitally signed     [facility] CPAP/BIPAP Polysomnogram Report Part II Phone: [phone] Fax: [phone]  Patient last name Vazquez Neck Size 15.0 in. Acquisition [id number]  Patient first name Lindsey Weight 260.0 lbs. Started 02/14/2021 at 10:34:07 PM  Birth date 1971-02-02 Height 61.0 in. Stopped 02/15/2021 at 5:07:25 AM  Age 30      Type Adult BMI 49.1 lb/in2 Duration 388.7  Report generated  by: Adriana Mccallum, RPSGT Sleep Data: Lights Out: 10:37:43 PM Sleep Onset: 10:43:07 PM  Lights On: 5:06:25 AM Sleep Efficiency: 93.8 %  Total Recording Time: 388.7 min Sleep Latency (from Lights Off) 5.4 min  Total Sleep Time (TST): 364.5 min R Latency (from Sleep Onset): 96.0 min  Sleep Period Time: 383.0 min Total number of awakenings: 12  Wake during sleep: 18.5 min Wake After Sleep Onset (WASO): 18.8 min   Sleep Data:         Arousal Summary: Stage  Latency  from lights out (min) Latency from sleep onset (min) Duration (min) % Total Sleep Time  Normal values  N 1 5.4 0.0 9.0 2.5 (5%)  N 2 6.4 1.0 236.5 64.9 (50%)  N 3 27.4 22.0 30.0 8.2 (20%)  R 101.4 96.0 89.0 24.4 (25%)    Number Index  Spontaneous 41 6.7  Apneas &  Hypopneas 9 1.5  RERAs 0 0.0       (Apneas & Hypopneas & RERAs)  (9) (1.5)  Limb Movement 4 0.7  Snore 0 0.0  TOTAL 54 8.9     Respiratory Data:  CA OA MA Apnea Hypopnea* A+ H RERA Total  Number 10 45 0 55 87 142 0 142  Mean Dur (sec) 12.1 11.6 0.0 11.7 19.1 16.2 0.0 16.2  Max Dur (sec) 16.0 16.5 0.0 16.5 45.0 45.0 0.0 45.0  Total Dur (min) 2.0 8.7 0.0 10.7 27.7 38.4 0.0 38.4  % of TST 0.6 2.4 0.0 2.9 7.6 10.5 0.0 10.5  Index (#/h TST) 1.6 7.4 0.0 9.1 14.3 23.4 0.0 23.4  *Hypopneas scored based on 4% or greater desaturation.  Sleep Stage:         Body Position Data:    REM NREM TST  AHI 14.8 26.1 23.4  RDI 14.8 26.1 23.4    Sleep (min) TST (%) REM (min) NREM (min) CA (#) OA (#) MA (#) HYP (#) AHI (#/h) RERA (#) RDI (#/h) Desat (#)  Supine 138.0 37.86 37.5 100.5 8 40 0 74 53.0 0 53.0 119  Non-Supine 226.50 62.14 51.50 175.00 2.00 5.00 0.00 13.00 5.30 0 5.30 34.00  Right: 226.5 62.14 51.5 175.0 2 5 0 13 5.3 0 5.3 34       Snoring: Total number of snoring episodes  0  Total time with snoring    min (   % of sleep)   Oximetry Distribution:             WK REM NREM TOTAL  Average (%)   97 96 96 96  < 90% 0.0 0.0 0.3 0.3  < 80% 0.0 0.0 0.0 0.0  < 70% 0.0 0.0 0.0 0.0  # of Desaturations* 2 23 128 153  Desat Index (#/hour) 5.0 15.5 27.9 25.2  Desat Max (%) '9 7 9 9  '$ Desat Max Dur (sec) 44.0 58.0 73.0 73.0  Approx Min O2 during sleep 87  Approx min O2 during a respiratory event 87  Was Oxygen added (Y/N) and final rate No:   0 LPM  *Desaturations based on 3% or greater drop from baseline.   Cheyne Stokes Breathing: None Present    Heart Rate Summary:  Average Heart Rate During Sleep 85.5 bpm      Highest Heart Rate During Sleep (95th %) 94.0 bpm      Highest Heart Rate During Sleep 120 bpm      Highest Heart Rate During Recording (TIB) 156 bpm (artifact)   Heart Rate Observations: Event Type # Events   Bradycardia 0 Lowest HR Scored: N/A  Sinus  Tachycardia During Sleep 0 Highest HR Scored: N/A  Narrow Complex Tachycardia 0 Highest HR Scored: N/A  Wide Complex Tachycardia 0 Highest HR Scored: N/A  Asystole 0 Longest Pause: N/A  Atrial Fibrillation 0 Duration Longest Event: N/A  Other Arrythmias  No Type:   Periodic Limb Movement Data: (Primary legs unless otherwise noted) Total # Limb Movement 9 Limb Movement Index 1.5  Total # PLMS    PLMS Index  Total # PLMS Arousals    PLMS Arousal Index     Percentage Sleep Time with PLMS   min (   % sleep)  Mean Duration limb movements (secs)      Brief Summary: The pt appeared to tolerate CPAP well during the study.    Starting pressure:  5cm of H2O Maximum Pressure:  17cm of H2O  Mask Type: ResMed AirFit P10  Mask Size: Medium pillows  Humidifier used: Yes Chin Strap used: No  CFlex: EPR 1 BiFlex: n/a    IPAP Level (cmH2O) EPAP Level (cmH2O) Total Duration (min) Sleep Duration (min) Sleep (%) REM (%) CA  #) OA # MA # HYP #) AHI (#/hr) RERAs # RERAs (#/hr) RDI (#/hr)  5 5 82.3 80.8 98.2 0.0 0 1 0 5 4.5 0 0.0 4.'5  7 7 '$ 18.8 17.8 94.7 28.7 0 0 0 4 13.5 0 0.0 13.'5  8 8 '$ 37.5 37.5 100.0 100.0 0 1 0 4 8.0 0 0.0 8.0  9 9 68.6 66.1 96.4 11.'7 2 3 '$ 0 0 4.5 0 0.0 4.'5  10 10 '$ 45.6 43.1 94.5 20.8 0 0 0 3 4.2 0 0.0 4.'2  11 11 '$ 57.2 56.7 99.1 48.'4 5 9 '$ 0 50 67.7 0 0.0 67.'7  13 13 '$ 11.0 11.0 100.0 0.0 0 14 0 9 125.5 0 0.0 125.'5  15 15 '$ 10.7 10.7 100.0 0.0 2 10 0 11 129.0 0 0.0 129.0  17 17 48.8 38.3 78.5 0.0 1 5 0 0 9.4 0 0.0 9.4

## 2021-02-26 ENCOUNTER — Other Ambulatory Visit: Payer: Self-pay | Admitting: "Endocrinology

## 2021-03-24 ENCOUNTER — Other Ambulatory Visit: Payer: Self-pay | Admitting: "Endocrinology

## 2021-03-24 ENCOUNTER — Ambulatory Visit: Payer: 59 | Admitting: "Endocrinology

## 2021-03-24 ENCOUNTER — Encounter: Payer: Self-pay | Admitting: "Endocrinology

## 2021-03-24 VITALS — BP 132/82 | HR 99 | Ht 61.0 in | Wt 272.0 lb

## 2021-03-24 DIAGNOSIS — I10 Essential (primary) hypertension: Secondary | ICD-10-CM | POA: Diagnosis not present

## 2021-03-24 DIAGNOSIS — E559 Vitamin D deficiency, unspecified: Secondary | ICD-10-CM | POA: Diagnosis not present

## 2021-03-24 DIAGNOSIS — E1165 Type 2 diabetes mellitus with hyperglycemia: Secondary | ICD-10-CM | POA: Diagnosis not present

## 2021-03-24 DIAGNOSIS — E782 Mixed hyperlipidemia: Secondary | ICD-10-CM

## 2021-03-24 MED ORDER — TOUJEO MAX SOLOSTAR 300 UNIT/ML ~~LOC~~ SOPN: 110.0000 [IU] | PEN_INJECTOR | Freq: Every day | SUBCUTANEOUS | 1 refills | Status: DC

## 2021-03-24 MED ORDER — INSULIN LISPRO (1 UNIT DIAL) 100 UNIT/ML (KWIKPEN): 30.0000 [IU] | PEN_INJECTOR | Freq: Three times a day (TID) | SUBCUTANEOUS | 2 refills | Status: DC

## 2021-03-24 NOTE — Progress Notes (Signed)
03/24/2021, 4:02 PM                                                     Endocrinology follow-up note   Subjective:    Patient ID: Lindsey Vazquez, female    DOB: May 18, 1971.  Lindsey Vazquez is being seen in follow-up for management of currently uncontrolled symptomatic type 2 diabetes, hyperlipidemia, vitamin D deficiency. PCP:   Renee Rival, NP.   Past Medical History:  Diagnosis Date   Diabetes mellitus without complication ([place])    Hyperlipemia    Past Surgical History:  Procedure Laterality Date   c-section     Social History   Socioeconomic History   Marital status: Married    Spouse name: Not on file   Number of children: Not on file   Years of education: Not on file   Highest education level: Not on file  Occupational History   Not on file  Tobacco Use   Smoking status: Former    Types: Cigarettes    Quit date: 07/25/2015    Years since quitting: 5.6   Smokeless tobacco: Never  Vaping Use   Vaping Use: Former  Substance and Sexual Activity   Alcohol use: No    Comment: socially   Drug use: No   Sexual activity: Yes  Other Topics Concern   Not on file  Social History Narrative   Not on file   Social Determinants of Health   Financial Resource Strain: Not on file  Food Insecurity: Not on file  Transportation Needs: Not on file  Physical Activity: Not on file  Stress: Not on file  Social Connections: Not on file   Outpatient Encounter Medications as of 03/24/2021  Medication Sig   aspirin 81 MG EC tablet Take 81 mg by mouth daily. Swallow whole.   atorvastatin (LIPITOR) 20 MG tablet Take 0.5 tablets (10 mg total) by mouth daily.   Cholecalciferol (VITAMIN D-3) 125 MCG (5000 UT) TABS Take 5,000 Units by mouth once a week. (Patient taking differently: Take 5,000 Units by mouth 2 (two) times a week.)   Continuous Blood Gluc Sensor (FREESTYLE LIBRE 14 DAY SENSOR) MISC Inject 1 each into the skin every 14  (fourteen) days. Use as directed.   glipiZIDE (GLUCOTROL XL) 5 MG 24 hr tablet TAKE (1) TABLET BY MOUTH DAILY WITH BREAKFAST.   ibuprofen (ADVIL,MOTRIN) 800 MG tablet Take 800 mg by mouth every 8 (eight) hours as needed.   insulin glargine, 2 Unit Dial, (TOUJEO MAX SOLOSTAR) 300 UNIT/ML Solostar Pen Inject 110 Units into the skin at bedtime.   insulin lispro (HUMALOG KWIKPEN) 100 UNIT/ML KwikPen Inject 30-36 Units into the skin 3 (three) times daily before meals.   MAGNESIUM OXIDE, ANTACID, PO Take 1 tablet by mouth daily in the afternoon.   metFORMIN (GLUCOPHAGE) 500 MG tablet TAKE (1) TABLET BY MOUTH TWICE A DAY WITH MEALS (BREAKFAST AND SUPPER)   Multiple Vitamin (MULTIVITAMIN) tablet Take 1 tablet by mouth daily.   potassium chloride SA (KLOR-CON) 20 MEQ tablet Take 1 tablet by mouth daily.   torsemide (DEMADEX) 20 MG tablet Take 20 mg by mouth daily.   [DISCONTINUED] insulin glargine, 2 Unit Dial, (TOUJEO MAX SOLOSTAR) 300 UNIT/ML Solostar Pen Inject 90 Units  into the skin at bedtime.   [DISCONTINUED] insulin lispro (HUMALOG KWIKPEN) 100 UNIT/ML KwikPen Inject 26-30 Units into the skin 3 (three) times daily before meals.   No facility-administered encounter medications on file as of 03/24/2021.    ALLERGIES: No Known Allergies  VACCINATION STATUS:  There is no immunization history on file for this patient.  Diabetes She presents for her follow-up diabetic visit. She has type 2 diabetes mellitus. Onset time: Diagnosed at approximate age of 55 years, after episode of gestational diabetes. Her disease course has been worsening. There are no hypoglycemic associated symptoms. Pertinent negatives for hypoglycemia include no confusion, headaches, pallor or seizures. Associated symptoms include foot paresthesias, polydipsia and polyphagia. Pertinent negatives for diabetes include no chest pain, no fatigue and no polyuria. There are no hypoglycemic complications. Symptoms are worsening (She was  recently hospitalized for DKA.). Diabetic complications include nephropathy. (Diabetes ketoacidosis.  She had acute renal failure which required brief.  Of hemodialysis during her hospitalization.) Risk factors for coronary artery disease include diabetes mellitus, dyslipidemia, hypertension, family history, obesity, sedentary lifestyle and tobacco exposure. Current diabetic treatment includes insulin injections. Her weight is increasing steadily. She is following a generally unhealthy diet. When asked about meal planning, she reported none. She rarely participates in exercise. Her home blood glucose trend is increasing steadily. Her breakfast blood glucose range is generally >200 mg/dl. Her lunch blood glucose range is generally >200 mg/dl. Her dinner blood glucose range is generally >200 mg/dl. Her bedtime blood glucose range is generally >200 mg/dl. Her overall blood glucose range is >200 mg/dl. (She presents with her CGM device showing significantly above target glycemic profile.  She is in range 25% of the time, above range 73% of the time.  She has rare random mild hypoglycemia.  Her point-of-care A1c is 10.3% increasing from 9.9%.   ) An ACE inhibitor/angiotensin II receptor blocker is not being taken.  Hyperlipidemia This is a chronic problem. The current episode started more than 1 year ago. The problem is uncontrolled. Exacerbating diseases include diabetes and obesity. Pertinent negatives include no chest pain, myalgias or shortness of breath. Current antihyperlipidemic treatment includes statins. Risk factors for coronary artery disease include dyslipidemia, diabetes mellitus, hypertension, family history, obesity and a sedentary lifestyle.  Hypertension This is a chronic problem. The current episode started more than 1 year ago. Pertinent negatives include no chest pain, headaches, palpitations or shortness of breath. Risk factors for coronary artery disease include diabetes mellitus,  dyslipidemia, sedentary lifestyle, smoking/tobacco exposure and obesity.   Review of systems  Constitutional: + Minimally fluctuating body weight,  current  Body mass index is 51.39 kg/m. , no fatigue, no subjective hyperthermia, no subjective hypothermia     Objective:    BP 132/82   Pulse 99   Ht '5\' 1"'$  (1.549 m)   Wt 272 lb (123.4 kg)   BMI 51.39 kg/m   Wt Readings from Last 3 Encounters:  03/24/21 272 lb (123.4 kg)  01/18/21 267 lb 12.8 oz (121.5 kg)  01/04/21 272 lb 12.8 oz (123.7 kg)      Physical Exam- Limited  Constitutional:  Body mass index is 51.39 kg/m. , not in acute distress, normal state of mind Abdomen: Skin exam is not convincing for the amount of insulin she is given to inject.   CMP     Component Value Date/Time   NA 141 09/23/2020 0803   K 4.8 09/23/2020 0803   CL 98 09/23/2020 0803   CO2 24 09/23/2020 0803  GLUCOSE 207 (H) 09/23/2020 0803   GLUCOSE 154 (H) 12/29/2019 1004   BUN 11 09/23/2020 0803   CREATININE 0.62 09/23/2020 0803   CREATININE 0.55 12/29/2019 1004   CALCIUM 10.5 (H) 09/23/2020 0803   PROT 8.0 09/23/2020 0803   ALBUMIN 4.7 09/23/2020 0803   AST 18 09/23/2020 0803   ALT 16 09/23/2020 0803   ALKPHOS 183 (H) 09/23/2020 0803   BILITOT 0.3 09/23/2020 0803   GFRNONAA 109 12/29/2019 1004   GFRAA 127 12/29/2019 1004   Lipid Panel     Component Value Date/Time   CHOL 164 12/29/2019 1004   TRIG 75 12/29/2019 1004   HDL 49 (L) 12/29/2019 1004   CHOLHDL 3.3 12/29/2019 1004   LDLCALC 99 12/29/2019 1004     Assessment & Plan:   1. Uncontrolled type 2 diabetes mellitus with hyperglycemia (HCC)  - Elenor G Seddon has currently uncontrolled symptomatic type 2 DM since 50 years of age.  She presents with her CGM device showing significantly above target glycemic profile.  She is in range 25% of the time, above range 73% of the time.  She has rare random mild hypoglycemia.  Her point-of-care A1c is 10.3% increasing from 9.9%.    -She did not have  hypoglycemic episodes since last visit.   Recent labs reviewed, which also shows significant vitamin D deficiency.    Patient was hospitalized on February 16, 2018 for  euglycemic diabetic ketoacidosis which was associated with acute renal failure requiring brief period of hemodialysis. -her diabetes is complicated by DKA, obesity, and sedentary life and she remains at a high risk for more acute and chronic complications which include CAD, CVA, CKD, retinopathy, and neuropathy. These are all discussed in detail with her.  - I have counseled her on diet management and weight loss, by adopting a carbohydrate restricted/protein rich diet.  - she acknowledges that there is a room for improvement in her food and drink choices. - Suggestion is made for her to avoid simple carbohydrates  from her diet including Cakes, Sweet Desserts, Ice Cream, Soda (diet and regular), Sweet Tea, Candies, Chips, Cookies, Store Bought Juices, Alcohol in Excess of  1-2 drinks a day, Artificial Sweeteners,  Coffee Creamer, and "Sugar-free" Products, Lemonade. This will help patient to have more stable blood glucose profile and potentially avoid unintended weight gain.   - I encouraged her to switch to  unprocessed or minimally processed complex starch and increased protein intake (animal or plant source), fruits, and vegetables.  - she is advised to stick to a routine mealtimes to eat 3 meals  a day and avoid unnecessary snacks ( to snack only to correct hypoglycemia).   - I have approached her with the following individualized plan to manage diabetes and patient agrees:   -Based on her current glycemic burden, recent euglycemic DKA, she will continue to need intensive treatment with basal/bolus insulin at higher dose in order for her to achieve control of diabetes to target.     -She has questionable compliance to the insulin administration.  In light of her presentation with continued glycemic  burden, she will be considered for higher dose of basal insulin.   -I discussed and increased her Toujeo to 110 units nightly, increase her Humalog to 30 units 3 times a day with meals  for pre-meal BG readings of 90-'150mg'$ /dl, plus patient specific correction dose for unexpected hyperglycemia above '150mg'$ /dl, associated with strict monitoring of glucose 4 times a day-before meals and at bedtime. - she  is warned not to take insulin without proper monitoring per orders. -Adjustment parameters are given for hypo and hyperglycemia in writing. - she is encouraged to call clinic for blood glucose levels less than 70 or above 300 mg /dl.   -Advised to stay away from SGLT2 inhibitors given her recent history of euglycemic DKA while taking Jardiance.  - She is a advised to continue metformin 500 mg p.o. twice daily, continue glipizide 5 mg XL p.o. daily 1 time.     - she will be considered for incretin therapy as appropriate next visit. - Patient specific target  A1c;  LDL, HDL, Triglycerides were discussed in detail.  2) BP/HTN:   -Her blood pressure is controlled to target.    she is currently not taking any blood pressure medications.    3) Lipids/HPL: Her recent lipid panel shows controlled LDL at 82.  She is advised to continue atorvastatin 10 mg p.o. nightly.   Side effects and precautions discussed with her.  4) vitamin  D deficiency- 25 hydroxy vitamin D remains low despite a course of treatment with vitamin D3 5000 units for 90 days .she is approached for retreatment with vitamin D3 5000 units daily for the next 90 days.    5) weight management: Her BMI is 51.5.  She has gained weight progressively.  She is currently being explored for bariatric surgery.  She would benefit greatly from this procedure.    6) hypercalcemia: Associated with high PTH.  Her 24-hour urine calcium is not elevated-210.  Still possible for mild early primary hyperparathyroidism.  She will not need acute intervention  right now.  She will have repeat PTH /calcium during her subsequent labs.     6) Chronic Care/Health Maintenance:  -she  is on Statin medications and  is encouraged to initiate and continue to follow up with Ophthalmology, Dentist,  Podiatrist at least yearly or according to recommendations, and advised to   stay away from smoking. I have recommended yearly flu vaccine and pneumonia vaccine at least every 5 years; moderate intensity exercise for up to 150 minutes weekly; and  sleep for at least 7 hours a day.  - I advised patient to maintain close follow up with Renee Rival, NP for primary care needs.    I spent 41 minutes in the care of the patient today including review of labs from [place], Lipids, Thyroid Function, Hematology (current and previous including abstractions from other facilities); face-to-face time discussing  her blood glucose readings/logs, discussing hypoglycemia and hyperglycemia episodes and symptoms, medications doses, her options of short and long term treatment based on the latest standards of care / guidelines;  discussion about incorporating lifestyle medicine;  and documenting the encounter.    Please refer to Patient Instructions for Blood Glucose Monitoring and Insulin/Medications Dosing Guide"  in media tab for additional information. Please  also refer to " Patient Self Inventory" in the Media  tab for reviewed elements of pertinent patient history.  Lindsey Vazquez participated in the discussions, expressed understanding, and voiced agreement with the above plans.  All questions were answered to her satisfaction. she is encouraged to contact clinic should she have any questions or concerns prior to her return visit.     Follow up plan: - Return in about 3 months (around 06/23/2021) for Bring Meter and Logs- A1c in Office.  Glade Lloyd, MD [facility] [address] [place], [place] [address] Phone:  [phone]  Fax: [phone]  03/24/2021, 4:02 PM  This note was partially dictated with voice recognition software. Similar sounding words can be transcribed inadequately or may not  be corrected upon review.

## 2021-03-24 NOTE — Patient Instructions (Signed)
                                     Advice for Weight Management  -For most of us the best way to lose weight is by diet management. Generally speaking, diet management means consuming less calories intentionally which over time brings about progressive weight loss.  This can be achieved more effectively by restricting carbohydrate consumption to the minimum possible.  So, it is critically important to know your numbers: how much calorie you are consuming and how much calorie you need. More importantly, our carbohydrates sources should be unprocessed or minimally processed complex starch food items.   Sometimes, it is important to balance nutrition by increasing protein intake (animal or plant source), fruits, and vegetables.  - Whole Food, Plant Predominant Nutrition is highly recommended: Eat Plenty of vegetables, Mushrooms, fruits, Legumes, Whole Grains, Nuts, seeds in lieu of processed meats, processed snacks/pastries red meat, poultry, eggs.  -Sticking to a routine mealtime to eat 3 meals a day and avoiding unnecessary snacks is shown to have a big role in weight control. Under normal circumstances, the only time we lose real weight is when we are hungry, so allow hunger to take place- hunger means no food between meal times, only water.  It is not advisable to starve.   -It is better to avoid simple carbohydrates including: Cakes, Sweet Desserts, Ice Cream, Soda (diet and regular), Sweet Tea, Candies, Chips, Cookies, Store Bought Juices, Alcohol in Excess of  1-2 drinks a day, Lemonade,  Artificial Sweeteners, Doughnuts, Coffee Creamers, "Sugar-free" Products, etc, etc.  This is not a complete list.....    -Consulting with certified diabetes educators is proven to provide you with the most accurate and current information on diet.  Also, you may be  interested in discussing diet options/exchanges , we can schedule a visit with Lindsey Vazquez, RDN, CDE for  individualized nutrition education.  -Exercise: If you are able: 30 -60 minutes a day ,4 days a week, or 150 minutes a week.  The longer the better.  Combine stretch, strength, and aerobic activities.  If you were told in the past that you have high risk for cardiovascular diseases, you may seek evaluation by your heart doctor prior to initiating moderate to intense exercise programs.                                  Additional Care Considerations for Diabetes   -Diabetes  is a chronic disease.  The most important care consideration is regular follow-up with your diabetes care provider with the goal being avoiding or delaying its complications and to take advantage of advances in medications and technology.    - Whole Food, Plant Predominant Nutrition is highly recommended: Eat Plenty of vegetables, Mushrooms, fruits, Legumes, Whole Grains, Nuts, seeds in lieu of processed meats, processed snacks/pastries red meat, poultry, eggs.  -Type 2 diabetes is known to coexist with other important comorbidities such as high blood pressure and high cholesterol.  It is critical to control not only the diabetes but also the high blood pressure and high cholesterol to minimize and delay the risk of complications including coronary artery disease, stroke, amputations, blindness, etc.    - Studies showed that people with diabetes will benefit from a class of medications known as ACE inhibitors and statins.  Unless   there are specific reasons not to be on these medications, the standard of care is to consider getting one from these groups of medications at an optimal doses.  These medications are generally considered safe and proven to help protect the heart and the kidneys.    - People with diabetes are encouraged to initiate and maintain regular follow-up with eye doctors, foot doctors, dentists , and if necessary heart and kidney doctors.     - It is highly recommended that people with diabetes quit smoking or  stay away from smoking, and get yearly  flu vaccine and pneumonia vaccine at least every 5 years.  One other important lifestyle recommendation is to ensure adequate sleep - at least 6-7 hours of uninterrupted sleep at night.  -Exercise: If you are able: 30 -60 minutes a day, 4 days a week, or 150 minutes a week.  The longer the better.  Combine stretch, strength, and aerobic activities.  If you were told in the past that you have high risk for cardiovascular diseases, you may seek evaluation by your heart doctor prior to initiating moderate to intense exercise programs.         

## 2021-03-30 ENCOUNTER — Other Ambulatory Visit: Payer: Self-pay | Admitting: "Endocrinology

## 2021-03-30 ENCOUNTER — Other Ambulatory Visit: Payer: Self-pay

## 2021-03-30 MED ORDER — TOUJEO MAX SOLOSTAR 300 UNIT/ML ~~LOC~~ SOPN: 110.0000 [IU] | PEN_INJECTOR | Freq: Every day | SUBCUTANEOUS | 1 refills | Status: DC

## 2021-03-30 NOTE — Telephone Encounter (Signed)
Pt requesting a refill on her Toujeo since 9/1. It was denied due to dosage change. Can you send in the correct dose? Patient is almost out.

## 2021-03-30 NOTE — Telephone Encounter (Signed)
This was not under Normal refill and under Fill Later. I have refused this request and sent the correct request with the appropriate dosage and quantity.

## 2021-04-22 ENCOUNTER — Other Ambulatory Visit: Payer: Self-pay | Admitting: "Endocrinology

## 2021-05-03 ENCOUNTER — Other Ambulatory Visit: Payer: Self-pay | Admitting: "Endocrinology

## 2021-06-02 ENCOUNTER — Telehealth: Payer: Self-pay | Admitting: "Endocrinology

## 2021-06-02 NOTE — Telephone Encounter (Signed)
I have not seen this paperwork. Have you seen this?

## 2021-06-02 NOTE — Telephone Encounter (Signed)
Patient is asking if we have received paperwork from a Dr Beather Arbour in regards to a surgery. Have you seen these?

## 2021-06-02 NOTE — Telephone Encounter (Signed)
Called pt made her aware that the last paperwork I had received from [place] was on 04/04/21 and was faxed back on 04/15/21. Pt voiced understanding.

## 2021-06-07 ENCOUNTER — Other Ambulatory Visit: Payer: Self-pay | Admitting: "Endocrinology

## 2021-06-09 ENCOUNTER — Telehealth: Payer: Self-pay | Admitting: "Endocrinology

## 2021-06-09 NOTE — Telephone Encounter (Signed)
Called pt to let her know that I have put a copy of this on Dr Emi Holes desk.

## 2021-06-09 NOTE — Telephone Encounter (Signed)
Patient is asking if we received a fax on 11/11 from Dr Beather Arbour. Please Advise.She said it was attention to [place].

## 2021-06-23 ENCOUNTER — Other Ambulatory Visit: Payer: Self-pay

## 2021-06-23 ENCOUNTER — Encounter: Payer: Self-pay | Admitting: "Endocrinology

## 2021-06-23 ENCOUNTER — Ambulatory Visit: Payer: 59 | Admitting: "Endocrinology

## 2021-06-23 VITALS — BP 128/80 | HR 96 | Ht 61.0 in | Wt 274.8 lb

## 2021-06-23 DIAGNOSIS — E782 Mixed hyperlipidemia: Secondary | ICD-10-CM

## 2021-06-23 DIAGNOSIS — E1165 Type 2 diabetes mellitus with hyperglycemia: Secondary | ICD-10-CM | POA: Diagnosis not present

## 2021-06-23 DIAGNOSIS — I10 Essential (primary) hypertension: Secondary | ICD-10-CM | POA: Diagnosis not present

## 2021-06-23 DIAGNOSIS — E559 Vitamin D deficiency, unspecified: Secondary | ICD-10-CM

## 2021-06-23 NOTE — Progress Notes (Signed)
06/23/2021, 4:51 PM                                                     Endocrinology follow-up note   Subjective:    Patient ID: Lindsey Vazquez, female    DOB: [birth year].  Lindsey Vazquez is being seen in follow-up for management of currently uncontrolled symptomatic type 2 diabetes, hyperlipidemia, vitamin D deficiency. PCP:   Renee Rival, NP.   Past Medical History:  Diagnosis Date   Diabetes mellitus without complication ([place])    Hyperlipemia    Past Surgical History:  Procedure Laterality Date   c-section     Social History   Socioeconomic History   Marital status: Married    Spouse name: Not on file   Number of children: Not on file   Years of education: Not on file   Highest education level: Not on file  Occupational History   Not on file  Tobacco Use   Smoking status: Former    Types: Cigarettes    Quit date: 07/25/2015    Years since quitting: 5.9   Smokeless tobacco: Never  Vaping Use   Vaping Use: Former  Substance and Sexual Activity   Alcohol use: No    Comment: socially   Drug use: No   Sexual activity: Yes  Other Topics Concern   Not on file  Social History Narrative   Not on file   Social Determinants of Health   Financial Resource Strain: Not on file  Food Insecurity: Not on file  Transportation Needs: Not on file  Physical Activity: Not on file  Stress: Not on file  Social Connections: Not on file   Outpatient Encounter Medications as of 06/23/2021  Medication Sig   aspirin 81 MG EC tablet Take 81 mg by mouth daily. Swallow whole.   atorvastatin (LIPITOR) 20 MG tablet Take 0.5 tablets (10 mg total) by mouth daily.   Cholecalciferol (VITAMIN D-3) 125 MCG (5000 UT) TABS Take 5,000 Units by mouth once a week. (Patient not taking: Reported on 06/23/2021)   Continuous Blood Gluc Sensor (FREESTYLE LIBRE 14 DAY SENSOR) MISC Inject 1 each into the skin every 14 (fourteen) days. Use as directed.    glipiZIDE (GLUCOTROL XL) 5 MG 24 hr tablet TAKE (1) TABLET BY MOUTH DAILY WITH BREAKFAST.   ibuprofen (ADVIL,MOTRIN) 800 MG tablet Take 800 mg by mouth every 8 (eight) hours as needed.   insulin glargine, 2 Unit Dial, (TOUJEO MAX SOLOSTAR) 300 UNIT/ML Solostar Pen Inject 110 Units into the skin at bedtime.   insulin lispro (HUMALOG KWIKPEN) 100 UNIT/ML KwikPen Inject 30-36 Units into the skin 3 (three) times daily before meals. (Patient taking differently: Inject 27-30 Units into the skin 3 (three) times daily before meals.)   MAGNESIUM OXIDE, ANTACID, PO Take 1 tablet by mouth daily in the afternoon.   metFORMIN (GLUCOPHAGE) 500 MG tablet TAKE (1) TABLET BY MOUTH TWICE A DAY WITH MEALS (BREAKFAST AND SUPPER)   Multiple Vitamin (MULTIVITAMIN) tablet Take 1 tablet by mouth daily.   potassium chloride SA (KLOR-CON) 20 MEQ tablet Take 1 tablet by mouth daily.   torsemide (DEMADEX) 20 MG tablet Take 20 mg by mouth daily.   No facility-administered encounter medications on file as of  06/23/2021.    ALLERGIES: No Known Allergies  VACCINATION STATUS:  There is no immunization history on file for this patient.  Diabetes She presents for her follow-up diabetic visit. She has type 2 diabetes mellitus. Onset time: Diagnosed at approximate age of 49 years, after episode of gestational diabetes. Her disease course has been worsening. There are no hypoglycemic associated symptoms. Pertinent negatives for hypoglycemia include no confusion, headaches, pallor or seizures. Associated symptoms include foot paresthesias, polydipsia and polyphagia. Pertinent negatives for diabetes include no chest pain, no fatigue and no polyuria. There are no hypoglycemic complications. Symptoms are worsening (She was recently hospitalized for DKA.). Diabetic complications include nephropathy. (Diabetes ketoacidosis.  She had acute renal failure which required brief.  Of hemodialysis during her hospitalization.) Risk factors for  coronary artery disease include diabetes mellitus, dyslipidemia, hypertension, family history, obesity, sedentary lifestyle and tobacco exposure. Current diabetic treatment includes insulin injections. Her weight is increasing steadily. She is following a generally unhealthy diet. When asked about meal planning, she reported none. She rarely participates in exercise. Her home blood glucose trend is increasing steadily. Her breakfast blood glucose range is generally >200 mg/dl. Her lunch blood glucose range is generally >200 mg/dl. Her dinner blood glucose range is generally >200 mg/dl. Her bedtime blood glucose range is generally >200 mg/dl. Her overall blood glucose range is >200 mg/dl. (She presents with continued severe glycemic burden averaging 214 despite high-dose of insulin.  Her CGM AGP report shows 33% time range, 64% above range.  She has no major sustained hypoglycemia.  Her recent A1c was 10.3% increasing from 9.9%.  She is scheduled to have bariatric surgery on August 11, 2021.   ) An ACE inhibitor/angiotensin II receptor blocker is not being taken.  Hyperlipidemia This is a chronic problem. The current episode started more than 1 year ago. The problem is uncontrolled. Exacerbating diseases include diabetes and obesity. Pertinent negatives include no chest pain, myalgias or shortness of breath. Current antihyperlipidemic treatment includes statins. Risk factors for coronary artery disease include dyslipidemia, diabetes mellitus, hypertension, family history, obesity and a sedentary lifestyle.  Hypertension This is a chronic problem. The current episode started more than 1 year ago. Pertinent negatives include no chest pain, headaches, palpitations or shortness of breath. Risk factors for coronary artery disease include diabetes mellitus, dyslipidemia, sedentary lifestyle, smoking/tobacco exposure and obesity.   Review of systems  Constitutional: + Minimally fluctuating body weight,  current   Body mass index is 51.92 kg/m. , no fatigue, no subjective hyperthermia, no subjective hypothermia     Objective:    BP 128/80   Pulse 96   Ht 5\' 1"  (1.549 m)   Wt 274 lb 12.8 oz (124.6 kg)   BMI 51.92 kg/m   Wt Readings from Last 3 Encounters:  06/23/21 274 lb 12.8 oz (124.6 kg)  03/24/21 272 lb (123.4 kg)  01/18/21 267 lb 12.8 oz (121.5 kg)      Physical Exam- Limited  Constitutional:  Body mass index is 51.92 kg/m. , not in acute distress, normal state of mind Abdomen: Skin exam is not convincing for the amount of insulin she is given to inject.   CMP     Component Value Date/Time   NA 141 09/23/2020 0803   K 4.8 09/23/2020 0803   CL 98 09/23/2020 0803   CO2 24 09/23/2020 0803   GLUCOSE 207 (H) 09/23/2020 0803   GLUCOSE 154 (H) 12/29/2019 1004   BUN 11 09/23/2020 0803   CREATININE 0.62 09/23/2020  7169   CREATININE 0.55 12/29/2019 1004   CALCIUM 10.5 (H) 09/23/2020 0803   PROT 8.0 09/23/2020 0803   ALBUMIN 4.7 09/23/2020 0803   AST 18 09/23/2020 0803   ALT 16 09/23/2020 0803   ALKPHOS 183 (H) 09/23/2020 0803   BILITOT 0.3 09/23/2020 0803   GFRNONAA 109 12/29/2019 1004   GFRAA 127 12/29/2019 1004   Lipid Panel     Component Value Date/Time   CHOL 164 12/29/2019 1004   TRIG 75 12/29/2019 1004   HDL 49 (L) 12/29/2019 1004   CHOLHDL 3.3 12/29/2019 1004   LDLCALC 99 12/29/2019 1004     Assessment & Plan:   1. Uncontrolled type 2 diabetes mellitus with hyperglycemia ([facility])  - Dannell G Cabriales has currently uncontrolled symptomatic type 2 DM since 50 years of age.  She presents with continued severe glycemic burden averaging 214 despite high-dose of insulin.  Her CGM AGP report shows 33% time range, 64% above range.  She has no major sustained hypoglycemia.  Her recent A1c was 10.3% increasing from 9.9%.  She is scheduled to have bariatric surgery on August 11, 2021.  -She did not have  hypoglycemic episodes since last visit.   Recent labs  reviewed, which also shows significant vitamin D deficiency.    Patient was hospitalized on February 16, 2018 for  euglycemic diabetic ketoacidosis which was associated with acute renal failure requiring brief period of hemodialysis. -her diabetes is complicated by DKA, obesity, and sedentary life and she remains at a high risk for more acute and chronic complications which include CAD, CVA, CKD, retinopathy, and neuropathy. These are all discussed in detail with her.  - I have counseled her on diet management and weight loss, by adopting a carbohydrate restricted/protein rich diet.  - she acknowledges that there is a room for improvement in her food and drink choices. - Suggestion is made for her to avoid simple carbohydrates  from her diet including Cakes, Sweet Desserts, Ice Cream, Soda (diet and regular), Sweet Tea, Candies, Chips, Cookies, Store Bought Juices, Alcohol in Excess of  1-2 drinks a day, Artificial Sweeteners,  Coffee Creamer, and "Sugar-free" Products, Lemonade. This will help patient to have more stable blood glucose profile and potentially avoid unintended weight gain.   - I encouraged her to switch to  unprocessed or minimally processed complex starch and increased protein intake (animal or plant source), fruits, and vegetables.  - she is advised to stick to a routine mealtimes to eat 3 meals  a day and avoid unnecessary snacks ( to snack only to correct hypoglycemia).   - I have approached her with the following individualized plan to manage diabetes and patient agrees:   -Based on her current glycemic burden, recent euglycemic DKA, she will continue to need intensive treatment with basal/bolus insulin at higher dose in order for her to achieve control of diabetes to target.   -She is being prepared for bariatric surgery on August 11, 2021.  This is probably the best health decisions for her.  -She has questionable compliance to the insulin administration.  In light of her  presentation with continued glycemic burden, she will be considered for higher dose of basal insulin.   -I discussed and increase her Toujeo to 120  units nightly, continue Humalog to 30  units 3 times a day with meals  for pre-meal BG readings of 90-150mg /dl, plus patient specific correction dose for unexpected hyperglycemia above 150mg /dl, associated with strict monitoring of glucose 4 times a  day-before meals and at bedtime. - she is warned not to take insulin without proper monitoring per orders. -Adjustment parameters are given for hypo and hyperglycemia in writing. - she is encouraged to call clinic for blood glucose levels less than 70 or above 200 mg /dl.   -Advised to stay away from SGLT2 inhibitors given her recent history of euglycemic DKA while taking Jardiance.  - She is a advised to continue metformin 500 mg p.o. twice daily, continue glipizide 5 mg XL p.o. daily at breakfast, until 2 days before her surgery when she was advised to discontinue.  - Patient specific target  A1c;  LDL, HDL, Triglycerides were discussed in detail.  2) BP/HTN:   .- Her blood pressure is controlled to target.    she is currently not taking any blood pressure medications.    3) Lipids/HPL: Her recent lipid panel shows controlled LDL at 82.  She is advised to continue atorvastatin 10 mg p.o. nightly.   Side effects and precautions discussed with her.  4) vitamin  D deficiency- 25 hydroxy vitamin D remains low despite a course of treatment with vitamin D3 5000 units for 90 days .she is approached for retreatment with vitamin D3 5000 units daily for the next 90 days.    5) weight management: Her BMI is 51.5.  She has gained weight progressively.  She is scheduled for bariatric surgery on August 11, 2021.  From diabetes point of view, she is cleared and will benefit greatly from this procedure.  She is advised to return to clinic 1 week after her discharge.  6) hypercalcemia: Associated with high PTH.   Her 24-hour urine calcium is not elevated-210.  Still possible for mild early primary hyperparathyroidism.  She will not need acute intervention right now.  She will have repeat PTH /calcium during her subsequent labs.     6) Chronic Care/Health Maintenance:  -she  is on Statin medications and  is encouraged to initiate and continue to follow up with Ophthalmology, Dentist,  Podiatrist at least yearly or according to recommendations, and advised to   stay away from smoking. I have recommended yearly flu vaccine and pneumonia vaccine at least every 5 years; moderate intensity exercise for up to 150 minutes weekly; and  sleep for at least 7 hours a day.  - I advised patient to maintain close follow up with Renee Rival, NP for primary care needs.   I spent 41 minutes in the care of the patient today including review of labs from [place], Lipids, Thyroid Function, Hematology (current and previous including abstractions from other facilities); face-to-face time discussing  her blood glucose readings/logs, discussing hypoglycemia and hyperglycemia episodes and symptoms, medications doses, her options of short and long term treatment based on the latest standards of care / guidelines;  discussion about incorporating lifestyle medicine;  and documenting the encounter.    Please refer to Patient Instructions for Blood Glucose Monitoring and Insulin/Medications Dosing Guide"  in media tab for additional information. Please  also refer to " Patient Self Inventory" in the Media  tab for reviewed elements of pertinent patient history.  Lindsey Vazquez participated in the discussions, expressed understanding, and voiced agreement with the above plans.  All questions were answered to her satisfaction. she is encouraged to contact clinic should she have any questions or concerns prior to her return visit.      Follow up plan: - Return in about 8 weeks (around 08/16/2021) for Bring Meter and Logs- A1c  in  Office.  Glade Lloyd, MD [facility] [address] [place], [place] [address] Phone: [phone]  Fax: [phone]    06/23/2021, 4:51 PM  This note was partially dictated with voice recognition software. Similar sounding words can be transcribed inadequately or may not  be corrected upon review.

## 2021-06-23 NOTE — Patient Instructions (Signed)

## 2021-07-15 ENCOUNTER — Other Ambulatory Visit: Payer: Self-pay | Admitting: "Endocrinology

## 2021-07-22 ENCOUNTER — Other Ambulatory Visit: Payer: Self-pay | Admitting: Nurse Practitioner

## 2021-07-22 ENCOUNTER — Other Ambulatory Visit: Payer: Self-pay | Admitting: "Endocrinology

## 2021-07-24 HISTORY — PX: CARDIAC CATHETERIZATION: SHX172

## 2021-08-10 ENCOUNTER — Encounter: Payer: Self-pay | Admitting: *Deleted

## 2021-08-10 ENCOUNTER — Telehealth: Payer: Self-pay | Admitting: *Deleted

## 2021-08-10 NOTE — Telephone Encounter (Signed)
Received tc from patient and Larena Glassman with [facility] @ [phone] requesting surgical clearance from a hematology standpoint, as requested by Anesthesia.  Patient has not been seen in our office since February 2019 and it was explained that she would need to be a new referral due to the lapse in care by our office.  Once we receive referral, Dr Delton Coombes has agreed to see her.  Unfortunately, her surgery will be postponed due to the late request.  Which was scheduled for 08/11/21.

## 2021-08-10 NOTE — Progress Notes (Signed)
Per patient, she was called by her 30 office at [facility] and it has been determined that she does not require hematology clearance for surgery tomorrow.

## 2021-08-11 HISTORY — PX: LAPAROSCOPIC GASTRIC SLEEVE RESECTION: SHX5895

## 2021-08-17 ENCOUNTER — Ambulatory Visit: Payer: 59 | Admitting: "Endocrinology

## 2021-09-01 ENCOUNTER — Ambulatory Visit (INDEPENDENT_AMBULATORY_CARE_PROVIDER_SITE_OTHER): Payer: 59 | Admitting: "Endocrinology

## 2021-09-01 ENCOUNTER — Other Ambulatory Visit: Payer: Self-pay

## 2021-09-01 ENCOUNTER — Encounter: Payer: Self-pay | Admitting: "Endocrinology

## 2021-09-01 VITALS — BP 116/80 | HR 67 | Ht 61.0 in | Wt 263.4 lb

## 2021-09-01 DIAGNOSIS — E559 Vitamin D deficiency, unspecified: Secondary | ICD-10-CM | POA: Diagnosis not present

## 2021-09-01 DIAGNOSIS — I10 Essential (primary) hypertension: Secondary | ICD-10-CM

## 2021-09-01 DIAGNOSIS — E782 Mixed hyperlipidemia: Secondary | ICD-10-CM

## 2021-09-01 DIAGNOSIS — E1165 Type 2 diabetes mellitus with hyperglycemia: Secondary | ICD-10-CM | POA: Diagnosis not present

## 2021-09-01 MED ORDER — FREESTYLE LIBRE 2 READER DEVI: 0 refills | Status: DC

## 2021-09-01 MED ORDER — TOUJEO MAX SOLOSTAR 300 UNIT/ML ~~LOC~~ SOPN: 40.0000 [IU] | PEN_INJECTOR | Freq: Every day | SUBCUTANEOUS | 1 refills | Status: DC

## 2021-09-01 MED ORDER — FREESTYLE LIBRE 2 SENSOR MISC: 1.0000 | 3 refills | Status: DC

## 2021-09-01 NOTE — Patient Instructions (Signed)

## 2021-09-01 NOTE — Progress Notes (Signed)
09/01/2021, 2:06 PM                                                     Endocrinology follow-up note   Subjective:    Patient ID: Lindsey Vazquez, female    DOB: [birth year].  Lindsey Vazquez is being seen in follow-up for management of currently uncontrolled symptomatic type 2 diabetes, hyperlipidemia, vitamin D deficiency. PCP:   Renee Rival, NP.   Past Medical History:  Diagnosis Date   Diabetes mellitus without complication ([place])    Hyperlipemia    Past Surgical History:  Procedure Laterality Date   c-section     CARDIAC CATHETERIZATION  07/2021   LAPAROSCOPIC GASTRIC SLEEVE RESECTION  08/11/2021   Social History   Socioeconomic History   Marital status: Married    Spouse name: Not on file   Number of children: Not on file   Years of education: Not on file   Highest education level: Not on file  Occupational History   Not on file  Tobacco Use   Smoking status: Former    Types: Cigarettes    Quit date: 07/25/2015    Years since quitting: 6.1   Smokeless tobacco: Never  Vaping Use   Vaping Use: Former  Substance and Sexual Activity   Alcohol use: No    Comment: socially   Drug use: No   Sexual activity: Yes  Other Topics Concern   Not on file  Social History Narrative   Not on file   Social Determinants of Health   Financial Resource Strain: Not on file  Food Insecurity: Not on file  Transportation Needs: Not on file  Physical Activity: Not on file  Stress: Not on file  Social Connections: Not on file   Outpatient Encounter Medications as of 09/01/2021  Medication Sig   acetaminophen (TYLENOL) 500 MG tablet Take by mouth.   Continuous Blood Gluc Receiver (FREESTYLE LIBRE 2 READER) DEVI As directed   Continuous Blood Gluc Sensor (FREESTYLE LIBRE 2 SENSOR) MISC 1 Piece by Does not apply route every 14 (fourteen) days.   Cyanocobalamin 500 MCG/0.1ML SOLN Place into the nose.   ondansetron (ZOFRAN-ODT) 4 MG  disintegrating tablet Take by mouth.   polyethylene glycol powder (GLYCOLAX/MIRALAX) 17 GM/SCOOP powder Take by mouth.   potassium chloride SA (KLOR-CON M) 20 MEQ tablet Take by mouth.   atorvastatin (LIPITOR) 20 MG tablet Take 0.5 tablets (10 mg total) by mouth daily. (Patient taking differently: Take 80 mg by mouth daily.)   Cholecalciferol (VITAMIN D-3) 125 MCG (5000 UT) TABS Take 5,000 Units by mouth once a week. (Patient not taking: Reported on 06/23/2021)   DULoxetine (CYMBALTA) 60 MG capsule Take 60 mg by mouth daily.   ELIQUIS 5 MG TABS tablet Take 5 mg by mouth 2 (two) times daily.   gabapentin (NEURONTIN) 300 MG capsule Take by mouth.   GNP VITAMIN B-1 100 MG tablet Take 100 mg by mouth daily.   ibuprofen (ADVIL,MOTRIN) 800 MG tablet Take 800 mg by mouth every 8 (eight) hours as needed.   insulin glargine, 2 Unit Dial, (TOUJEO MAX SOLOSTAR) 300 UNIT/ML Solostar Pen Inject 40 Units into the skin at bedtime.   MAGNESIUM OXIDE, ANTACID, PO Take 1 tablet by mouth daily  in the afternoon.   metFORMIN (GLUCOPHAGE) 500 MG tablet TAKE (1) TABLET BY MOUTH TWICE A DAY WITH MEALS (BREAKFAST AND SUPPER)   metoprolol succinate (TOPROL-XL) 100 MG 24 hr tablet Take 100 mg by mouth daily.   Multiple Vitamin (MULTIVITAMIN) tablet Take 1 tablet by mouth daily.   scopolamine (TRANSDERM-SCOP) 1 MG/3DAYS 1 patch every 3 (three) days.   torsemide (DEMADEX) 20 MG tablet Take 20 mg by mouth daily.   [DISCONTINUED] aspirin 81 MG EC tablet Take 81 mg by mouth daily. Swallow whole. (Patient not taking: Reported on 09/01/2021)   [DISCONTINUED] Continuous Blood Gluc Sensor (FREESTYLE LIBRE 14 DAY SENSOR) MISC Inject 1 each into the skin every 14 (fourteen) days. Use as directed.   [DISCONTINUED] glipiZIDE (GLUCOTROL XL) 5 MG 24 hr tablet TAKE (1) TABLET BY MOUTH DAILY WITH BREAKFAST.   [DISCONTINUED] insulin glargine, 2 Unit Dial, (TOUJEO MAX SOLOSTAR) 300 UNIT/ML Solostar Pen Inject 120 Units into the skin at bedtime.  (Patient taking differently: Inject 32 Units into the skin at bedtime.)   [DISCONTINUED] insulin lispro (HUMALOG KWIKPEN) 100 UNIT/ML KwikPen Inject 27-30 Units into the skin 3 (three) times daily before meals. (Patient taking differently: Inject 8 Units into the skin 3 (three) times daily before meals.)   [DISCONTINUED] potassium chloride SA (KLOR-CON) 20 MEQ tablet Take 1 tablet by mouth daily.   [DISCONTINUED] insulin glargine-yfgn (SEMGLEE) injection    [DISCONTINUED] insulin lispro (HUMALOG) injection    No facility-administered encounter medications on file as of 09/01/2021.    ALLERGIES: No Known Allergies  VACCINATION STATUS:  There is no immunization history on file for this patient.  Diabetes She presents for her follow-up diabetic visit. She has type 2 diabetes mellitus. Onset time: Diagnosed at approximate age of 49 years, after episode of gestational diabetes. Her disease course has been improving. There are no hypoglycemic associated symptoms. Pertinent negatives for hypoglycemia include no confusion, headaches, pallor or seizures. Associated symptoms include foot paresthesias, polydipsia and polyphagia. Pertinent negatives for diabetes include no chest pain, no fatigue and no polyuria. There are no hypoglycemic complications. Symptoms are improving (She was recently hospitalized for DKA.). Diabetic complications include nephropathy. (Diabetes ketoacidosis.  She had acute renal failure which required brief.  Of hemodialysis during her hospitalization.) Risk factors for coronary artery disease include diabetes mellitus, dyslipidemia, hypertension, family history, obesity, sedentary lifestyle and tobacco exposure. Current diabetic treatment includes insulin injections. Her weight is decreasing steadily. She is following a generally unhealthy diet. When asked about meal planning, she reported none. She rarely participates in exercise. Her home blood glucose trend is decreasing steadily. Her  breakfast blood glucose range is generally 180-200 mg/dl. Her lunch blood glucose range is generally 180-200 mg/dl. Her dinner blood glucose range is generally 180-200 mg/dl. Her bedtime blood glucose range is generally 180-200 mg/dl. Her overall blood glucose range is 180-200 mg/dl. (She underwent sleeve gastrectomy on August 11, 2021.  This resulted in loss of 11 pounds.  After her surgery, she developed atrial fibrillation, DKA.  She is not sure what happened however she thinks that her insulin was lowered to low after her surgery.  She presents with her CGM showing continued improvement in her glycemic profile   with 87% target range, 9% slightly above range.  No hypoglycemia.   She is currently on Toujeo 36 units, and Humalog 8 units 3 times daily AC.  She has not started her metformin yet.  She is still on glipizide 5 mg p.o. daily.) An ACE inhibitor/angiotensin II  receptor blocker is not being taken.  Hyperlipidemia This is a chronic problem. The current episode started more than 1 year ago. The problem is uncontrolled. Exacerbating diseases include diabetes and obesity. Pertinent negatives include no chest pain, myalgias or shortness of breath. Current antihyperlipidemic treatment includes statins. Risk factors for coronary artery disease include dyslipidemia, diabetes mellitus, hypertension, family history, obesity and a sedentary lifestyle.  Hypertension This is a chronic problem. The current episode started more than 1 year ago. Pertinent negatives include no chest pain, headaches, palpitations or shortness of breath. Risk factors for coronary artery disease include diabetes mellitus, dyslipidemia, sedentary lifestyle, smoking/tobacco exposure and obesity.   Review of systems  Constitutional: + Minimally fluctuating body weight,  current  Body mass index is 49.77 kg/m. , no fatigue, no subjective hyperthermia, no subjective hypothermia     Objective:    BP 116/80    Pulse 67    Ht 5\' 1"   (1.549 m)    Wt 263 lb 6.4 oz (119.5 kg)    BMI 49.77 kg/m   Wt Readings from Last 3 Encounters:  09/01/21 263 lb 6.4 oz (119.5 kg)  06/23/21 274 lb 12.8 oz (124.6 kg)  03/24/21 272 lb (123.4 kg)      Physical Exam- Limited  Constitutional:  Body mass index is 49.77 kg/m. , not in acute distress, normal state of mind Abdomen: Skin exam is not convincing for the amount of insulin she is given to inject.   CMP     Component Value Date/Time   NA 141 09/23/2020 0803   K 4.8 09/23/2020 0803   CL 98 09/23/2020 0803   CO2 24 09/23/2020 0803   GLUCOSE 207 (H) 09/23/2020 0803   GLUCOSE 154 (H) 12/29/2019 1004   BUN 11 09/23/2020 0803   CREATININE 0.62 09/23/2020 0803   CREATININE 0.55 12/29/2019 1004   CALCIUM 10.5 (H) 09/23/2020 0803   PROT 8.0 09/23/2020 0803   ALBUMIN 4.7 09/23/2020 0803   AST 18 09/23/2020 0803   ALT 16 09/23/2020 0803   ALKPHOS 183 (H) 09/23/2020 0803   BILITOT 0.3 09/23/2020 0803   GFRNONAA 109 12/29/2019 1004   GFRAA 127 12/29/2019 1004   Lipid Panel     Component Value Date/Time   CHOL 164 12/29/2019 1004   TRIG 75 12/29/2019 1004   HDL 49 (L) 12/29/2019 1004   CHOLHDL 3.3 12/29/2019 1004   LDLCALC 99 12/29/2019 1004     Assessment & Plan:   1. Uncontrolled type 2 diabetes mellitus with hyperglycemia ([facility])  - Avi G Delahoussaye has currently uncontrolled symptomatic type 2 DM since 51 years of age.  She underwent sleeve gastrectomy on August 11, 2021.  This resulted in loss of 11 pounds.  After her surgery, she developed atrial fibrillation, DKA.  She is not sure what happened however she thinks that her insulin was lowered to low after her surgery.  She presents with her CGM showing continued improvement in her glycemic profile   with 87% target range, 9% slightly above range.  No hypoglycemia.   She is currently on Toujeo 36 units, and Humalog 8 units 3 times daily AC.  She has not started her metformin yet.  She is still on glipizide 5 mg  p.o. daily.  -She did not have  hypoglycemic episodes since last visit.   Recent labs reviewed, which also shows significant vitamin D deficiency.    Patient was hospitalized on February 16, 2018 for  euglycemic diabetic ketoacidosis which was associated  with acute renal failure requiring brief period of hemodialysis. -her diabetes is complicated by DKA, obesity, and sedentary life and she remains at a high risk for more acute and chronic complications which include CAD, CVA, CKD, retinopathy, and neuropathy. These are all discussed in detail with her.  - I have counseled her on diet management and weight loss, by adopting a carbohydrate restricted/protein rich diet.  - she acknowledges that there is a room for improvement in her food and drink choices. - Suggestion is made for her to avoid simple carbohydrates  from her diet including Cakes, Sweet Desserts, Ice Cream, Soda (diet and regular), Sweet Tea, Candies, Chips, Cookies, Store Bought Juices, Alcohol , Artificial Sweeteners,  Coffee Creamer, and "Sugar-free" Products, Lemonade. This will help patient to have more stable blood glucose profile and potentially avoid unintended weight gain.  The following Lifestyle Medicine recommendations according to [facility]) were discussed and and offered to patient and she  agrees to start the journey:  A. Whole Foods, Plant-Based Nutrition comprising of fruits and vegetables, plant-based proteins, whole-grain carbohydrates was discussed in detail with the patient.   A list for source of those nutrients were also provided to the patient.  Patient will use only water or unsweetened tea for hydration. B.  The need to stay away from risky substances including alcohol, smoking; obtaining 7 to 9 hours of restorative sleep, at least 150 minutes of moderate intensity exercise weekly, the importance of healthy social connections,  and stress management techniques were discussed. C.   A full color page of  Calorie density of various food groups per pound showing examples of each food groups was provided to the patient.  - she is advised to stick to a routine mealtimes to eat 3 meals  a day and avoid unnecessary snacks ( to snack only to correct hypoglycemia).   - I have approached her with the following individualized plan to manage diabetes and patient agrees:   -In light of her presentation with tightening glycemic profile, 11 pounds of weight loss status post sleeve gastrectomy, she will need less insulin.  She is advised to stop Humalog and glipizide.  She is advised to increase her Toujeo slightly to 40 units nightly.  She is advised to restart metformin 500 mg p.o. twice daily.   She is encouraged to use her CGM continuously.  - she is encouraged to call clinic for blood glucose levels less than 70 or above 200 mg /dl.   -Advised to stay away from SGLT2 inhibitors given her recent history of euglycemic DKA while taking Jardiance.   - Patient specific target  A1c;  LDL, HDL, Triglycerides were discussed in detail.  2) BP/HTN:   -Her blood pressure is controlled to target.    she is currently not taking any blood pressure medications.    3) Lipids/HPL: Her recent lipid panel shows controlled LDL at 82.  She is advised to continue atorvastatin 10 mg p.o. nightly.    Side effects and precautions discussed with her.  4) vitamin  D deficiency- 25 hydroxy vitamin D remains low despite a course of treatment with vitamin D3 5000 units for 90 days .she is approached for retreatment with vitamin D3 5000 units daily for the next 90 days.    5) weight management: Her BMI is 49.77-she is status post sleeve gastrectomy on August 11, 2021.    6) hypercalcemia: Associated with high PTH.  Her 24-hour urine calcium is not  elevated-210.  Still possible for mild early primary hyperparathyroidism.  She will not need acute intervention right now.  She will have repeat PTH /calcium  during her subsequent labs.     6) Chronic Care/Health Maintenance:  -she  is on Statin medications and  is encouraged to initiate and continue to follow up with Ophthalmology, Dentist,  Podiatrist at least yearly or according to recommendations, and advised to   stay away from smoking. I have recommended yearly flu vaccine and pneumonia vaccine at least every 5 years; moderate intensity exercise for up to 150 minutes weekly; and  sleep for at least 7 hours a day.  - I advised patient to maintain close follow up with Renee Rival, NP for primary care needs.   I spent 44 minutes in the care of the patient today including review of labs from [place], Lipids, Thyroid Function, Hematology (current and previous including abstractions from other facilities); face-to-face time discussing  her blood glucose readings/logs, discussing hypoglycemia and hyperglycemia episodes and symptoms, medications doses, her options of short and long term treatment based on the latest standards of care / guidelines;  discussion about incorporating lifestyle medicine;  and documenting the encounter.    Please refer to Patient Instructions for Blood Glucose Monitoring and Insulin/Medications Dosing Guide"  in media tab for additional information. Please  also refer to " Patient Self Inventory" in the Media  tab for reviewed elements of pertinent patient history.  Lindsey Vazquez participated in the discussions, expressed understanding, and voiced agreement with the above plans.  All questions were answered to her satisfaction. she is encouraged to contact clinic should she have any questions or concerns prior to her return visit.   Follow up plan: - Return in about 4 weeks (around 09/29/2021) for F/U with Meter and Logs Only - no Labs.  Glade Lloyd, MD [facility] [address] [place], [place] [address] Phone: [phone]  Fax: [phone]    09/01/2021, 2:06  PM  This note was partially dictated with voice recognition software. Similar sounding words can be transcribed inadequately or may not  be corrected upon review.

## 2021-09-07 ENCOUNTER — Other Ambulatory Visit: Payer: Self-pay | Admitting: "Endocrinology

## 2021-09-10 ENCOUNTER — Emergency Department (HOSPITAL_COMMUNITY): Payer: 59

## 2021-09-10 ENCOUNTER — Encounter (HOSPITAL_COMMUNITY): Payer: Self-pay | Admitting: Emergency Medicine

## 2021-09-10 ENCOUNTER — Inpatient Hospital Stay (HOSPITAL_COMMUNITY)
Admission: EM | Admit: 2021-09-10 | Discharge: 2021-09-12 | DRG: 074 | Disposition: A | Discharge status: Home / Self Care | Payer: 59 | Attending: Internal Medicine | Admitting: Internal Medicine

## 2021-09-10 ENCOUNTER — Other Ambulatory Visit: Payer: Self-pay

## 2021-09-10 DIAGNOSIS — Z794 Long term (current) use of insulin: Secondary | ICD-10-CM

## 2021-09-10 DIAGNOSIS — Z20822 Contact with and (suspected) exposure to covid-19: Secondary | ICD-10-CM | POA: Diagnosis present

## 2021-09-10 DIAGNOSIS — K3184 Gastroparesis: Secondary | ICD-10-CM | POA: Diagnosis present

## 2021-09-10 DIAGNOSIS — Z7901 Long term (current) use of anticoagulants: Secondary | ICD-10-CM

## 2021-09-10 DIAGNOSIS — I10 Essential (primary) hypertension: Secondary | ICD-10-CM | POA: Diagnosis present

## 2021-09-10 DIAGNOSIS — E1143 Type 2 diabetes mellitus with diabetic autonomic (poly)neuropathy: Principal | ICD-10-CM | POA: Diagnosis present

## 2021-09-10 DIAGNOSIS — Z6841 Body Mass Index (BMI) 40.0 and over, adult: Secondary | ICD-10-CM

## 2021-09-10 DIAGNOSIS — D72829 Elevated white blood cell count, unspecified: Secondary | ICD-10-CM | POA: Diagnosis present

## 2021-09-10 DIAGNOSIS — R112 Nausea with vomiting, unspecified: Secondary | ICD-10-CM | POA: Diagnosis not present

## 2021-09-10 DIAGNOSIS — K2970 Gastritis, unspecified, without bleeding: Secondary | ICD-10-CM | POA: Diagnosis present

## 2021-09-10 DIAGNOSIS — Z9884 Bariatric surgery status: Secondary | ICD-10-CM | POA: Diagnosis not present

## 2021-09-10 DIAGNOSIS — E1165 Type 2 diabetes mellitus with hyperglycemia: Secondary | ICD-10-CM

## 2021-09-10 DIAGNOSIS — G4733 Obstructive sleep apnea (adult) (pediatric): Secondary | ICD-10-CM | POA: Diagnosis present

## 2021-09-10 DIAGNOSIS — I480 Paroxysmal atrial fibrillation: Secondary | ICD-10-CM | POA: Diagnosis present

## 2021-09-10 DIAGNOSIS — D471 Chronic myeloproliferative disease: Secondary | ICD-10-CM | POA: Diagnosis present

## 2021-09-10 DIAGNOSIS — Z7984 Long term (current) use of oral hypoglycemic drugs: Secondary | ICD-10-CM | POA: Diagnosis not present

## 2021-09-10 DIAGNOSIS — E1110 Type 2 diabetes mellitus with ketoacidosis without coma: Secondary | ICD-10-CM | POA: Diagnosis present

## 2021-09-10 DIAGNOSIS — Z79899 Other long term (current) drug therapy: Secondary | ICD-10-CM | POA: Diagnosis not present

## 2021-09-10 DIAGNOSIS — Z87891 Personal history of nicotine dependence: Secondary | ICD-10-CM

## 2021-09-10 DIAGNOSIS — E782 Mixed hyperlipidemia: Secondary | ICD-10-CM | POA: Diagnosis present

## 2021-09-10 LAB — CBC WITH DIFFERENTIAL/PLATELET
Abs Immature Granulocytes: 0.05 10*3/uL (ref 0.00–0.07)
Basophils Absolute: 0.1 10*3/uL (ref 0.0–0.1)
Basophils Relative: 1 %
Eosinophils Absolute: 0 10*3/uL (ref 0.0–0.5)
Eosinophils Relative: 0 %
HCT: 46.7 % — ABNORMAL HIGH (ref 36.0–46.0)
Hemoglobin: 14.4 g/dL (ref 12.0–15.0)
Immature Granulocytes: 0 %
Lymphocytes Relative: 13 %
Lymphs Abs: 1.6 10*3/uL (ref 0.7–4.0)
MCH: 28.9 pg (ref 26.0–34.0)
MCHC: 30.8 g/dL (ref 30.0–36.0)
MCV: 93.8 fL (ref 80.0–100.0)
Monocytes Absolute: 0.4 10*3/uL (ref 0.1–1.0)
Monocytes Relative: 3 %
Neutro Abs: 9.9 10*3/uL — ABNORMAL HIGH (ref 1.7–7.7)
Neutrophils Relative %: 83 %
Platelets: 200 10*3/uL (ref 150–400)
RBC: 4.98 MIL/uL (ref 3.87–5.11)
RDW: 15.5 % (ref 11.5–15.5)
WBC: 12 10*3/uL — ABNORMAL HIGH (ref 4.0–10.5)
nRBC: 0 % (ref 0.0–0.2)

## 2021-09-10 LAB — CBG MONITORING, ED
Glucose-Capillary: 347 mg/dL — ABNORMAL HIGH (ref 70–99)
Glucose-Capillary: 355 mg/dL — ABNORMAL HIGH (ref 70–99)
Glucose-Capillary: 401 mg/dL — ABNORMAL HIGH (ref 70–99)
Glucose-Capillary: 360 mg/dL — ABNORMAL HIGH (ref 70–99)
Glucose-Capillary: 319 mg/dL — ABNORMAL HIGH (ref 70–99)
Glucose-Capillary: 351 mg/dL — ABNORMAL HIGH (ref 70–99)
Glucose-Capillary: 269 mg/dL — ABNORMAL HIGH (ref 70–99)

## 2021-09-10 LAB — GLUCOSE, CAPILLARY
Glucose-Capillary: 176 mg/dL — ABNORMAL HIGH (ref 70–99)
Glucose-Capillary: 140 mg/dL — ABNORMAL HIGH (ref 70–99)
Glucose-Capillary: 127 mg/dL — ABNORMAL HIGH (ref 70–99)
Glucose-Capillary: 128 mg/dL — ABNORMAL HIGH (ref 70–99)
Glucose-Capillary: 116 mg/dL — ABNORMAL HIGH (ref 70–99)
Glucose-Capillary: 125 mg/dL — ABNORMAL HIGH (ref 70–99)

## 2021-09-10 LAB — BASIC METABOLIC PANEL
Anion gap: 19 — ABNORMAL HIGH (ref 5–15)
Anion gap: 19 — ABNORMAL HIGH (ref 5–15)
Anion gap: 9 (ref 5–15)
BUN: 6 mg/dL (ref 6–20)
BUN: 6 mg/dL (ref 6–20)
BUN: 6 mg/dL (ref 6–20)
CO2: 15 mmol/L — ABNORMAL LOW (ref 22–32)
CO2: 13 mmol/L — ABNORMAL LOW (ref 22–32)
CO2: 21 mmol/L — ABNORMAL LOW (ref 22–32)
Calcium: 9.2 mg/dL (ref 8.9–10.3)
Calcium: 9.2 mg/dL (ref 8.9–10.3)
Calcium: 9.1 mg/dL (ref 8.9–10.3)
Chloride: 100 mmol/L (ref 98–111)
Chloride: 103 mmol/L (ref 98–111)
Chloride: 107 mmol/L (ref 98–111)
Creatinine, Ser: 0.75 mg/dL (ref 0.44–1.00)
Creatinine, Ser: 0.83 mg/dL (ref 0.44–1.00)
Creatinine, Ser: 0.73 mg/dL (ref 0.44–1.00)
GFR, Estimated: >60 mL/min (ref >60)
GFR, Estimated: >60 mL/min (ref >60)
GFR, Estimated: >60 mL/min (ref >60)
Glucose, Bld: 400 mg/dL — ABNORMAL HIGH (ref 70–99)
Glucose, Bld: 317 mg/dL — ABNORMAL HIGH (ref 70–99)
Glucose, Bld: 122 mg/dL — ABNORMAL HIGH (ref 70–99)
Potassium: 4.7 mmol/L (ref 3.5–5.1)
Potassium: 4.7 mmol/L (ref 3.5–5.1)
Potassium: 3.7 mmol/L (ref 3.5–5.1)
Sodium: 134 mmol/L — ABNORMAL LOW (ref 135–145)
Sodium: 135 mmol/L (ref 135–145)
Sodium: 137 mmol/L (ref 135–145)

## 2021-09-10 LAB — BLOOD GAS, VENOUS
Acid-base deficit: 5.6 mmol/L — ABNORMAL HIGH (ref 0.0–2.0)
Bicarbonate: 18.2 mmol/L — ABNORMAL LOW (ref 20.0–28.0)
Drawn by: 4981
FIO2: 21 %
O2 Saturation: 96 %
Patient temperature: 37.3
pCO2, Ven: 30 mmHg — ABNORMAL LOW (ref 44–60)
pH, Ven: 7.39 (ref 7.25–7.43)
pO2, Ven: 74 mmHg — ABNORMAL HIGH (ref 32–45)

## 2021-09-10 LAB — COMPREHENSIVE METABOLIC PANEL
ALT: 30 U/L (ref 0–44)
AST: 31 U/L (ref 15–41)
Albumin: 4 g/dL (ref 3.5–5.0)
Alkaline Phosphatase: 108 U/L (ref 38–126)
Anion gap: 19 — ABNORMAL HIGH (ref 5–15)
BUN: 5 mg/dL — ABNORMAL LOW (ref 6–20)
CO2: 20 mmol/L — ABNORMAL LOW (ref 22–32)
Calcium: 9.3 mg/dL (ref 8.9–10.3)
Chloride: 99 mmol/L (ref 98–111)
Creatinine, Ser: 0.62 mg/dL (ref 0.44–1.00)
GFR, Estimated: >60 mL/min (ref >60)
Glucose, Bld: 351 mg/dL — ABNORMAL HIGH (ref 70–99)
Potassium: 3.9 mmol/L (ref 3.5–5.1)
Sodium: 138 mmol/L (ref 135–145)
Total Bilirubin: 1 mg/dL (ref 0.3–1.2)
Total Protein: 8.2 g/dL — ABNORMAL HIGH (ref 6.5–8.1)

## 2021-09-10 LAB — URINALYSIS, ROUTINE W REFLEX MICROSCOPIC
Bilirubin Urine: NEGATIVE
Glucose, UA: >=500 mg/dL — AB
Hgb urine dipstick: NEGATIVE
Ketones, ur: 80 mg/dL — AB
Leukocytes,Ua: NEGATIVE
Nitrite: NEGATIVE
Protein, ur: NEGATIVE mg/dL
Specific Gravity, Urine: 1.022 (ref 1.005–1.030)
pH: 6 (ref 5.0–8.0)

## 2021-09-10 LAB — RESP PANEL BY RT-PCR (FLU A&B, COVID) ARPGX2
Influenza A by PCR: NEGATIVE
Influenza B by PCR: NEGATIVE
SARS Coronavirus 2 by RT PCR: NEGATIVE

## 2021-09-10 LAB — OSMOLALITY: Osmolality: 309 mOsm/kg — ABNORMAL HIGH (ref 275–295)

## 2021-09-10 LAB — BETA-HYDROXYBUTYRIC ACID: Beta-Hydroxybutyric Acid: 5.86 mmol/L — ABNORMAL HIGH (ref 0.05–0.27)

## 2021-09-10 LAB — LIPASE, BLOOD: Lipase: 20 U/L (ref 11–51)

## 2021-09-10 LAB — HIV ANTIBODY (ROUTINE TESTING W REFLEX): HIV Screen 4th Generation wRfx: NONREACTIVE

## 2021-09-10 MED ORDER — CHLORHEXIDINE GLUCONATE CLOTH 2 % EX PADS
6.0000 | MEDICATED_PAD | Freq: Every day | CUTANEOUS | Status: DC
  Administered 2021-09-10 – 2021-09-12 (×2): 6 via TOPICAL

## 2021-09-10 MED ORDER — LACTATED RINGERS IV SOLN: INTRAVENOUS | Status: DC

## 2021-09-10 MED ORDER — METFORMIN HCL 500 MG PO TABS: 500.0000 mg | ORAL_TABLET | Freq: Two times a day (BID) | ORAL | Status: DC

## 2021-09-10 MED ORDER — DEXTROSE IN LACTATED RINGERS 5 % IV SOLN: INTRAVENOUS | Status: DC

## 2021-09-10 MED ORDER — GABAPENTIN 300 MG PO CAPS
300.0000 mg | ORAL_CAPSULE | Freq: Every day | ORAL | Status: DC
  Administered 2021-09-11 – 2021-09-12 (×2): 300 mg via ORAL
  Filled 2021-09-10 (×2): qty 1

## 2021-09-10 MED ORDER — APIXABAN 5 MG PO TABS
5.0000 mg | ORAL_TABLET | Freq: Two times a day (BID) | ORAL | Status: DC
  Administered 2021-09-11 – 2021-09-12 (×2): 5 mg via ORAL
  Filled 2021-09-10 (×2): qty 1

## 2021-09-10 MED ORDER — PANTOPRAZOLE SODIUM 40 MG IV SOLR
40.0000 mg | INTRAVENOUS | Status: DC
  Administered 2021-09-10 – 2021-09-11 (×2): 40 mg via INTRAVENOUS
  Filled 2021-09-10 (×2): qty 10

## 2021-09-10 MED ORDER — LACTATED RINGERS IV BOLUS
1000.0000 mL | Freq: Once | INTRAVENOUS | Status: AC
  Administered 2021-09-10: 1000 mL via INTRAVENOUS

## 2021-09-10 MED ORDER — ADULT MULTIVITAMIN W/MINERALS CH
1.0000 | ORAL_TABLET | Freq: Every day | ORAL | Status: DC
  Administered 2021-09-11 – 2021-09-12 (×2): 1 via ORAL
  Filled 2021-09-10 (×2): qty 1

## 2021-09-10 MED ORDER — IOHEXOL 300 MG/ML  SOLN
100.0000 mL | Freq: Once | INTRAMUSCULAR | Status: AC | PRN
  Administered 2021-09-10: 100 mL via INTRAVENOUS

## 2021-09-10 MED ORDER — METOCLOPRAMIDE HCL 5 MG/ML IJ SOLN
10.0000 mg | Freq: Once | INTRAMUSCULAR | Status: AC
  Administered 2021-09-10: 10 mg via INTRAVENOUS
  Filled 2021-09-10: qty 2

## 2021-09-10 MED ORDER — INSULIN ASPART PROT & ASPART (70-30 MIX) 100 UNIT/ML ~~LOC~~ SUSP
10.0000 [IU] | Freq: Once | SUBCUTANEOUS | Status: DC
  Filled 2021-09-10: qty 10

## 2021-09-10 MED ORDER — POTASSIUM CHLORIDE 10 MEQ/100ML IV SOLN
10.0000 meq | INTRAVENOUS | Status: AC
  Administered 2021-09-10 (×2): 10 meq via INTRAVENOUS
  Filled 2021-09-10 (×2): qty 100

## 2021-09-10 MED ORDER — ONDANSETRON HCL 4 MG/2ML IJ SOLN
4.0000 mg | Freq: Once | INTRAMUSCULAR | Status: AC
  Administered 2021-09-10: 4 mg via INTRAVENOUS
  Filled 2021-09-10: qty 2

## 2021-09-10 MED ORDER — ONDANSETRON 4 MG PO TBDP
4.0000 mg | ORAL_TABLET | Freq: Once | ORAL | Status: AC | PRN
  Administered 2021-09-10: 4 mg via ORAL
  Filled 2021-09-10: qty 1

## 2021-09-10 MED ORDER — DEXTROSE 50 % IV SOLN: 0.0000 mL | INTRAVENOUS | Status: DC | PRN

## 2021-09-10 MED ORDER — ACETAMINOPHEN 325 MG PO TABS: 650.0000 mg | ORAL_TABLET | Freq: Four times a day (QID) | ORAL | Status: DC | PRN

## 2021-09-10 MED ORDER — INSULIN REGULAR(HUMAN) IN NACL 100-0.9 UT/100ML-% IV SOLN
INTRAVENOUS | Status: DC
  Administered 2021-09-10 (×2): 19 [IU]/h via INTRAVENOUS
  Filled 2021-09-10 (×2): qty 100

## 2021-09-10 MED ORDER — IOHEXOL 9 MG/ML PO SOLN
ORAL | Status: AC
  Filled 2021-09-10: qty 1000

## 2021-09-10 MED ORDER — ONDANSETRON HCL 4 MG PO TABS: 4.0000 mg | ORAL_TABLET | Freq: Four times a day (QID) | ORAL | Status: DC | PRN

## 2021-09-10 MED ORDER — ATORVASTATIN CALCIUM 40 MG PO TABS
80.0000 mg | ORAL_TABLET | Freq: Every day | ORAL | Status: DC
Start: 2021-09-10 — End: 2021-09-12
  Administered 2021-09-11 – 2021-09-12 (×2): 80 mg via ORAL
  Filled 2021-09-10 (×2): qty 2

## 2021-09-10 MED ORDER — DIPHENHYDRAMINE HCL 50 MG/ML IJ SOLN
25.0000 mg | Freq: Once | INTRAMUSCULAR | Status: AC
  Administered 2021-09-10: 25 mg via INTRAVENOUS
  Filled 2021-09-10: qty 1

## 2021-09-10 MED ORDER — METOCLOPRAMIDE HCL 5 MG/ML IJ SOLN
5.0000 mg | Freq: Once | INTRAMUSCULAR | Status: AC
  Administered 2021-09-10: 5 mg via INTRAVENOUS
  Filled 2021-09-10: qty 2

## 2021-09-10 MED ORDER — METOPROLOL SUCCINATE ER 50 MG PO TB24
100.0000 mg | ORAL_TABLET | Freq: Every day | ORAL | Status: DC
  Administered 2021-09-11 – 2021-09-12 (×2): 100 mg via ORAL
  Filled 2021-09-10 (×2): qty 2

## 2021-09-10 MED ORDER — METOPROLOL TARTRATE 5 MG/5ML IV SOLN
5.0000 mg | INTRAVENOUS | Status: DC | PRN
  Administered 2021-09-10: 5 mg via INTRAVENOUS
  Filled 2021-09-10: qty 5

## 2021-09-10 MED ORDER — ACETAMINOPHEN 650 MG RE SUPP
650.0000 mg | Freq: Four times a day (QID) | RECTAL | Status: DC | PRN
Start: 2021-09-10 — End: 2021-09-12

## 2021-09-10 MED ORDER — ONDANSETRON HCL 4 MG/2ML IJ SOLN
4.0000 mg | Freq: Four times a day (QID) | INTRAMUSCULAR | Status: DC | PRN
  Administered 2021-09-10 – 2021-09-11 (×3): 4 mg via INTRAVENOUS
  Filled 2021-09-10 (×3): qty 2

## 2021-09-10 MED ORDER — DULOXETINE HCL 60 MG PO CPEP
60.0000 mg | ORAL_CAPSULE | Freq: Every day | ORAL | Status: DC
Start: 2021-09-10 — End: 2021-09-12
  Administered 2021-09-11 – 2021-09-12 (×2): 60 mg via ORAL
  Filled 2021-09-10 (×2): qty 1

## 2021-09-10 NOTE — Assessment & Plan Note (Addendum)
-  Calculated BMI is 46.49 -Patient is s/p sleeve gastrectomy about 6 weeks ago.  -Continue to follow with bariatric service as an outpatient. -Low calorie diet, portion control and increase physical activity discussed with patient.

## 2021-09-10 NOTE — Assessment & Plan Note (Addendum)
-  Follow up as outpatient with Hem/Onc..  -Patient with leukocytosis.

## 2021-09-10 NOTE — Assessment & Plan Note (Addendum)
-  Patient with type 2 diabetes mellitus.  -Presented with DKA and flare of gastroparesis. -After resolution of DKA process, patient started on Reglan with adequate diet tolerance. -Insulin therapy has been adjusted at discharge -Patient will resume the use of metformin as well. -Continue close CBGs monitoring and further adjust the dose of Reglan as needed to further control gastroparesis symptoms.

## 2021-09-10 NOTE — Assessment & Plan Note (Addendum)
-  Worsening leukocytosis at 20.2 with no signs of systemic bacterial infection, continue to hold on antibiotic therapy.  -In the setting of stress demargination most likely; repeat CBC at follow-up visit to assure resolution of WBCs elevation. -Continue to maintain adequate hydration.

## 2021-09-10 NOTE — ED Provider Notes (Signed)
Lindsey Vazquez Provider Note   CSN: [id number] Arrival date & time: 09/10/21  0340     History  Chief Complaint  Patient presents with   Emesis    Lindsey Vazquez is a 51 y.o. female.  The history is provided by the patient.  Emesis She has history of hypertension, diabetes, hyperlipidemia, morbid obesity status post gastric sleeve on 1/19 and comes in because of vomiting which started last night.  There is some intermittent upper abdominal pain.  She denies any constipation or diarrhea.  She denies fever or chills.  There has not been any blood in the emesis.  She denies any sick contacts.   Home Medications Prior to Admission medications   Medication Sig Start Date End Date Taking? Authorizing Provider  acetaminophen (TYLENOL) 500 MG tablet Take by mouth. 07/26/21   [provider]  atorvastatin (LIPITOR) 20 MG tablet Take 0.5 tablets (10 mg total) by mouth daily. Patient taking differently: Take 80 mg by mouth daily. 05/23/18   Cassandria Anger, MD  Cholecalciferol (VITAMIN D-3) 125 MCG (5000 UT) TABS Take 5,000 Units by mouth once a week. Patient not taking: Reported on 06/23/2021 08/22/18   Cassandria Anger, MD  Continuous Blood Gluc Receiver (FREESTYLE LIBRE 2 READER) DEVI As directed 09/01/21   Cassandria Anger, MD  Continuous Blood Gluc Sensor (FREESTYLE LIBRE 2 SENSOR) MISC 1 Piece by Does not apply route every 14 (fourteen) days. 09/01/21   Cassandria Anger, MD  Cyanocobalamin 500 MCG/0.1ML SOLN Place into the nose. 07/26/21   [provider]  DULoxetine (CYMBALTA) 60 MG capsule Take 60 mg by mouth daily. 07/22/21   [provider]  ELIQUIS 5 MG TABS tablet Take 5 mg by mouth 2 (two) times daily. 08/15/21   [provider]  gabapentin (NEURONTIN) 300 MG capsule Take by mouth. 08/31/21   [provider]  GNP VITAMIN B-1 100 MG tablet Take 100 mg by mouth daily. 08/31/21   [provider]   ibuprofen (ADVIL,MOTRIN) 800 MG tablet Take 800 mg by mouth every 8 (eight) hours as needed.    [provider]  insulin glargine, 2 Unit Dial, (TOUJEO MAX SOLOSTAR) 300 UNIT/ML Solostar Pen Inject 40 Units into the skin at bedtime. 09/01/21   Cassandria Anger, MD  MAGNESIUM OXIDE, ANTACID, PO Take 1 tablet by mouth daily in the afternoon.    [provider]  metFORMIN (GLUCOPHAGE) 500 MG tablet TAKE (1) TABLET BY MOUTH TWICE A DAY WITH MEALS (BREAKFAST AND SUPPER) 09/07/21   Nida, Marella Chimes, MD  metoprolol succinate (TOPROL-XL) 100 MG 24 hr tablet Take 100 mg by mouth daily. 08/31/21   [provider]  Multiple Vitamin (MULTIVITAMIN) tablet Take 1 tablet by mouth daily.    [provider]  ondansetron (ZOFRAN-ODT) 4 MG disintegrating tablet Take by mouth. 08/31/21 09/14/21  [provider]  polyethylene glycol powder (GLYCOLAX/MIRALAX) 17 GM/SCOOP powder Take by mouth. 08/31/21   [provider]  potassium chloride SA (KLOR-CON M) 20 MEQ tablet Take by mouth. 06/11/20   [provider]  scopolamine (TRANSDERM-SCOP) 1 MG/3DAYS 1 patch every 3 (three) days. 07/26/21   [provider]  torsemide (DEMADEX) 20 MG tablet Take 20 mg by mouth daily.    [provider]      Allergies    Patient has no known allergies.    Review of Systems   Review of Systems  Gastrointestinal:  Positive for vomiting.  All  other systems reviewed and are negative.  Physical Exam Updated Vital Signs BP (!) 156/88 (BP Location: Right Arm)    Pulse 86    Temp 98.2 F (36.8 C) (Oral)    Resp 18    Ht 5\' 1"  (1.549 m)    Wt 113.9 kg    SpO2 98%    BMI 47.43 kg/m  Physical Exam Vitals and nursing note reviewed.  Morbidly obese 51 year old female, resting comfortably and in no acute distress. Vital signs are significant for elevated blood pressure. Oxygen saturation is 99%, which is normal. Head is normocephalic and atraumatic. PERRLA, EOMI.  Oropharynx is clear. Neck is nontender and supple without adenopathy or JVD. Back is nontender and there is no CVA tenderness. Lungs are clear without rales, wheezes, or rhonchi. Chest is nontender. Heart has regular rate and rhythm without murmur. Abdomen is soft, flat, with mild epigastric tenderness.  There is no rebound or guarding.  Peristalsis is hypoactive. Extremities have no cyanosis or edema, full range of motion is present. Skin is warm and dry without rash. Neurologic: Mental status is normal, cranial nerves are intact, moves all extremities equally.  ED Results / Procedures / Treatments   Labs (all labs ordered are listed, but only abnormal results are displayed) Labs Reviewed  LIPASE, BLOOD  COMPREHENSIVE METABOLIC PANEL  CBC  URINALYSIS, ROUTINE W REFLEX MICROSCOPIC    EKG None  Radiology No results found.  Procedures Procedures    Medications Ordered in ED Medications  ondansetron (ZOFRAN-ODT) disintegrating tablet 4 mg (4 mg Oral Given 09/10/21 0404)    ED Course/ Medical Decision Making/ A&P                           Medical Decision Making Amount and/or Complexity of Data Reviewed Labs: ordered. Radiology: ordered.  Risk Prescription drug management.   Vomiting with epigastric tenderness.  Most likely, this is viral gastritis but need to consider possible bowel obstruction, possible internal hernia.  She will be given IV fluids, ondansetron and will be sent for CT of abdomen and pelvis.  Old records reviewed showing sleeve gastrectomy done at [facility] on 08/11/2021.  CBC shows mild leukocytosis with left shift, metabolic panel is unremarkable and lipase is normal.  Case is signed out to Dr. Pearline Cables.        Final Clinical Impression(s) / ED Diagnoses Final diagnoses:  Nausea and vomiting, unspecified vomiting type    Rx / DC Orders ED Discharge Orders     None         Delora Fuel, MD 92/11/94 [phone]

## 2021-09-10 NOTE — Assessment & Plan Note (Addendum)
-  Continue the use of statins -Healthy diet discussed with patient.

## 2021-09-10 NOTE — ED Notes (Signed)
Pt ambulated to restroom without assistance.

## 2021-09-10 NOTE — ED Notes (Signed)
Upon assessment pt left AC IV infiltrated. This nurse and Berline Lopes, RN restarted endotool at this time.

## 2021-09-10 NOTE — H&P (Addendum)
History and Physical    Patient: Lindsey Vazquez CWC:[id number] DOB: Jan 10, 1971 DOA: 09/10/2021 DOS: the patient was seen and examined on 09/10/2021 PCP: Renee Rival, NP  Patient coming from: Home  Chief Complaint:  nausea and vomiting, abdominal pain.  Chief Complaint  Patient presents with   Emesis    HPI: Lindsey Vazquez is a 51 y.o. female with medical history significant of type 2 diabetes mellitus,  dyslipidemia and obesity who presented with nausea, vomiting and abdominal pain. Patient sudden onset of abdominal pain, moderate to severe in intensity, at the epigastrium, associated with nausea and vomiting. It started about 2 hours after her dinner last night. She ate at a [employer] fish with drunk sweet tea. Patient developed hyperglycemia and worsening symptoms, persistent overnight, prompting her husband to bring her to the hospital.  Recent hospitalization about 10 days ago at [facility], for DKA, she was discharged with higher dose of insulin. She has follow up with endocrinology on 09/01/21 and was placed on 40 units of long acting insulin along with metformin and insulin sliding scale.  Her glucose until last night were reasonably well controlled in the mid 150's.     Review of Systems: As mentioned in the history of present illness. All other systems reviewed and are negative. Past Medical History:  Diagnosis Date   Diabetes mellitus without complication ([place])    Hyperlipemia    Past Surgical History:  Procedure Laterality Date   c-section     CARDIAC CATHETERIZATION  07/2021   LAPAROSCOPIC GASTRIC SLEEVE RESECTION  08/11/2021   Social History:  reports that she quit smoking about 6 years ago. Her smoking use included cigarettes. She has never used smokeless tobacco. She reports that she does not drink alcohol and does not use drugs.  No Known Allergies  History reviewed. No pertinent family history.  Prior to Admission  medications   Medication Sig Start Date End Date Taking? Authorizing Provider  acetaminophen (TYLENOL) 500 MG tablet Take by mouth. 07/26/21   [provider]  atorvastatin (LIPITOR) 20 MG tablet Take 0.5 tablets (10 mg total) by mouth daily. Patient taking differently: Take 80 mg by mouth daily. 05/23/18   Cassandria Anger, MD  Cholecalciferol (VITAMIN D-3) 125 MCG (5000 UT) TABS Take 5,000 Units by mouth once a week. Patient not taking: Reported on 06/23/2021 08/22/18   Cassandria Anger, MD  Continuous Blood Gluc Receiver (FREESTYLE LIBRE 2 READER) DEVI As directed 09/01/21   Cassandria Anger, MD  Continuous Blood Gluc Sensor (FREESTYLE LIBRE 2 SENSOR) MISC 1 Piece by Does not apply route every 14 (fourteen) days. 09/01/21   Cassandria Anger, MD  Cyanocobalamin 500 MCG/0.1ML SOLN Place into the nose. 07/26/21   [provider]  DULoxetine (CYMBALTA) 60 MG capsule Take 60 mg by mouth daily. 07/22/21   [provider]  ELIQUIS 5 MG TABS tablet Take 5 mg by mouth 2 (two) times daily. 08/15/21   [provider]  gabapentin (NEURONTIN) 300 MG capsule Take by mouth. 08/31/21   [provider]  GNP VITAMIN B-1 100 MG tablet Take 100 mg by mouth daily. 08/31/21   [provider]  ibuprofen (ADVIL,MOTRIN) 800 MG tablet Take 800 mg by mouth every 8 (eight) hours as needed.    [provider]  insulin glargine, 2 Unit Dial, (TOUJEO MAX SOLOSTAR) 300 UNIT/ML Solostar Pen Inject 40 Units into the skin at bedtime. 09/01/21   Cassandria Anger, MD  MAGNESIUM OXIDE, ANTACID, PO Take 1 tablet by mouth daily in the afternoon.    [provider]  metFORMIN (GLUCOPHAGE) 500 MG tablet TAKE (1) TABLET BY MOUTH TWICE A DAY WITH MEALS (BREAKFAST AND SUPPER) 09/07/21   Nida, Marella Chimes, MD  metoprolol succinate (TOPROL-XL) 100 MG 24 hr tablet Take 100 mg by mouth daily. 08/31/21   [provider]  Multiple Vitamin (MULTIVITAMIN)  tablet Take 1 tablet by mouth daily.    [provider]  ondansetron (ZOFRAN-ODT) 4 MG disintegrating tablet Take by mouth. 08/31/21 09/14/21  [provider]  polyethylene glycol powder (GLYCOLAX/MIRALAX) 17 GM/SCOOP powder Take by mouth. 08/31/21   [provider]  potassium chloride SA (KLOR-CON M) 20 MEQ tablet Take by mouth. 06/11/20   [provider]  scopolamine (TRANSDERM-SCOP) 1 MG/3DAYS 1 patch every 3 (three) days. 07/26/21   [provider]  torsemide (DEMADEX) 20 MG tablet Take 20 mg by mouth daily.    [provider]    Physical Exam: Vitals:   09/10/21 1130 09/10/21 1330 09/10/21 1430 09/10/21 1500  BP: (!) 180/91 (!) 167/88 (!) 174/101 (!) 150/102  Pulse: (!) 105 (!) 119 (!) 118 (!) 122  Resp: 16 15 18 20   Temp:      TempSrc:      SpO2: 98% 98% 99% 95%  Weight:      Height:       Neurology awake and alert ENT mild pallor with dry mucous membranes Cardiovascular with heart S1 and S2 present and rhythmic, no gallops or murmurs, positive tachycardia.  Respiratory with no wheezing or rales Abdomen protuberant with no distention  Skin no rashes.  Lower extremity with non pitting edema.   Data Reviewed:   51 yo female with the past medical history of T2DM on insulin therapy who presented with nausea, vomiting and abdominal pain about 2 hours after dinner last night. Positive hyperglycemia, weakness and not able to tolerate po. On her initial physical examination her blood pressure 156/88, HR 86, RR 18 and oxygen saturation 98 on room air, dry mucous membranes, lungs clear to auscultation, heart with S1 and S2 tachycardic, abdomen soft and non tender and non pitting lower extremity edema.   NA 138, K 3,9, CL 99, bicarbonate 22. Glucose 352, BUN 5 and cr 0,62. Anion gap 19 Lipase 20 Wbc 12, hgb 14,4 hct 93,8 plt 200   Sars covid 19 negative  Urine analysis with SG 1.022 with >500 glucose   CT abdomen and pelvis with no  bowel obstruction, no acute changes.   Lindsey Vazquez is being admitted to the hospital with the working diagnosis of diabetes ketoacidosis.    Assessment and Plan: * DKA (diabetic ketoacidosis) ([place])- (present on admission) Patient with type 2 diabetes mellitus.   Plan to continue IV fluids with lactate ringres, when glucose less than 250 mg/dl transition to D5 1/5 LR at 100 ml per hr. Follow on basic metabolic panel resolution of ketoacidosis. Continue with clear liquid diet and antiemetic therapy. When anion gap closed and she tolerated po diet, will transition to sq insulin.   Gastritis- (present on admission) Patient with significant abdominal pain, nausea and vomiting CT abdomen and pelvis with no acute changes, lipase is not elevated.  Continue supportive medical therapy with antiacid therapy IV with pantoprazole As needed antiemetics.   Essential hypertension, benign- (present on admission) Continue blood pressure control with metoprolol.   Paroxysmal atrial fibrillation ([place])- (present on admission) Continue rate control with metoprolol  and anticoagulation with apixaban. Add IV metoprolol in case HR 130 bpm or greater.  Continue telemetry monitoring   Leukocytosis- (present on admission) Reactive leukocytosis, no signs of systemic bacterial infection, continue holding antibiotic therapy.   OSA (obstructive sleep apnea)- (present on admission) Continue with Cpap at night.   MPN (myeloproliferative neoplasm) ([place])- (present on admission) Follow up as outpatient.   Mixed hyperlipidemia- (present on admission) Continue with statin therapy.   Class 3 obesity ([place])- (present on admission) Calculated BMI is 47,3 Patient is sp sleeve gastrectomy about 6 weeks ago.        Advance Care Planning:   Code Status: Full Code   Consults: none   Family Communication:  no family at the bedside   Severity of Illness: The appropriate patient status for this patient is  INPATIENT. Inpatient status is judged to be reasonable and necessary in order to provide the required intensity of service to ensure the patient's safety. The patient's presenting symptoms, physical exam findings, and initial radiographic and laboratory data in the context of their chronic comorbidities is felt to place them at high risk for further clinical deterioration. Furthermore, it is not anticipated that the patient will be medically stable for discharge from the hospital within 2 midnights of admission.   * I certify that at the point of admission it is my clinical judgment that the patient will require inpatient hospital care spanning beyond 2 midnights from the point of admission due to high intensity of service, high risk for further deterioration and high frequency of surveillance required.*  Author: Tawni Millers, MD 09/10/2021 3:27 PM  For on call review [email or web address].

## 2021-09-10 NOTE — ED Triage Notes (Signed)
Pt c/o N/V since 0100. Pt has gastric sleeve surgery on 1/19

## 2021-09-10 NOTE — Assessment & Plan Note (Addendum)
-  Continue metoprolol and apixaban for secondary prevention -Follow electrolytes (especially potassium and magnesium) goal is to keep potassium above 4 and magnesium above 2. -Patient advised to maintain adequate hydration.

## 2021-09-10 NOTE — ED Provider Notes (Signed)
[facility] Provider Note   CSN: [id number] Arrival date & time: 09/10/21  0340     History  Chief Complaint  Patient presents with   Emesis    Lindsey Vazquez is a 51 y.o. female.  This is a 52 y.o. female with significant medical history as below, including type 2 diabetes on insulin, gastric sleeve last month, hyperlipidemia, obesity who presents to the ED with complaint of abdominal pain, nausea and vomiting.  Patient reports onset of symptoms within the last 24 hours.  Unable to tolerate oral intake since symptom onset.  Ongoing nausea and vomiting.  Onset of emesis was just prior to arrival.  Patient has not taken her Humalog today secondary to not eating.  No change to bowel or bladder function.  No fevers or chills.  No sick contacts or recent travel.  Mild abdominal pain to the epigastric region, cramping, aching.  Nonradiating.  No chest pain or dyspnea.  Past Medical History: No date: Diabetes mellitus without complication ([place]) No date: Hyperlipemia  Past Surgical History: No date: c-section 07/2021: CARDIAC CATHETERIZATION 08/11/2021: LAPAROSCOPIC GASTRIC SLEEVE RESECTION    The history is provided by the patient. No language interpreter was used.  Emesis Associated symptoms: abdominal pain   Associated symptoms: no cough, no fever and no headaches       Home Medications Prior to Admission medications   Medication Sig Start Date End Date Taking? Authorizing Provider  acetaminophen (TYLENOL) 500 MG tablet Take by mouth. 07/26/21   [provider]  atorvastatin (LIPITOR) 20 MG tablet Take 0.5 tablets (10 mg total) by mouth daily. Patient taking differently: Take 80 mg by mouth daily. 05/23/18   Cassandria Anger, MD  Cholecalciferol (VITAMIN D-3) 125 MCG (5000 UT) TABS Take 5,000 Units by mouth once a week. Patient not taking: Reported on 06/23/2021 08/22/18   Cassandria Anger, MD  Continuous Blood Gluc Receiver  (FREESTYLE LIBRE 2 READER) DEVI As directed 09/01/21   Cassandria Anger, MD  Continuous Blood Gluc Sensor (FREESTYLE LIBRE 2 SENSOR) MISC 1 Piece by Does not apply route every 14 (fourteen) days. 09/01/21   Cassandria Anger, MD  Cyanocobalamin 500 MCG/0.1ML SOLN Place into the nose. 07/26/21   [provider]  DULoxetine (CYMBALTA) 60 MG capsule Take 60 mg by mouth daily. 07/22/21   [provider]  ELIQUIS 5 MG TABS tablet Take 5 mg by mouth 2 (two) times daily. 08/15/21   [provider]  gabapentin (NEURONTIN) 300 MG capsule Take by mouth. 08/31/21   [provider]  GNP VITAMIN B-1 100 MG tablet Take 100 mg by mouth daily. 08/31/21   [provider]  ibuprofen (ADVIL,MOTRIN) 800 MG tablet Take 800 mg by mouth every 8 (eight) hours as needed.    [provider]  insulin glargine, 2 Unit Dial, (TOUJEO MAX SOLOSTAR) 300 UNIT/ML Solostar Pen Inject 40 Units into the skin at bedtime. 09/01/21   Cassandria Anger, MD  MAGNESIUM OXIDE, ANTACID, PO Take 1 tablet by mouth daily in the afternoon.    [provider]  metFORMIN (GLUCOPHAGE) 500 MG tablet TAKE (1) TABLET BY MOUTH TWICE A DAY WITH MEALS (BREAKFAST AND SUPPER) 09/07/21   Nida, Marella Chimes, MD  metoprolol succinate (TOPROL-XL) 100 MG 24 hr tablet Take 100 mg by mouth daily. 08/31/21   [provider]  Multiple Vitamin (MULTIVITAMIN) tablet Take 1 tablet by mouth daily.    [provider]  ondansetron (ZOFRAN-ODT) 4  MG disintegrating tablet Take by mouth. 08/31/21 09/14/21  [provider]  polyethylene glycol powder (GLYCOLAX/MIRALAX) 17 GM/SCOOP powder Take by mouth. 08/31/21   [provider]  potassium chloride SA (KLOR-CON M) 20 MEQ tablet Take by mouth. 06/11/20   [provider]  scopolamine (TRANSDERM-SCOP) 1 MG/3DAYS 1 patch every 3 (three) days. 07/26/21   [provider]  torsemide (DEMADEX) 20 MG tablet Take 20 mg by mouth  daily.    [provider]      Allergies    Patient has no known allergies.    Review of Systems   Review of Systems  Constitutional:  Positive for appetite change. Negative for activity change and fever.  HENT:  Negative for facial swelling and trouble swallowing.   Eyes:  Negative for discharge and redness.  Respiratory:  Negative for cough and shortness of breath.   Cardiovascular:  Negative for chest pain and palpitations.  Gastrointestinal:  Positive for abdominal pain, nausea and vomiting.  Genitourinary:  Negative for dysuria and flank pain.  Musculoskeletal:  Negative for back pain and gait problem.  Skin:  Negative for pallor and rash.  Neurological:  Negative for syncope and headaches.   Physical Exam Updated Vital Signs BP (!) 150/102    Pulse (!) 124    Temp 99.1 F (37.3 C) (Oral)    Resp 14    Ht 5\' 1"  (1.549 m)    Wt 113.9 kg    SpO2 99%    BMI 47.43 kg/m  Physical Exam Vitals and nursing note reviewed.  Constitutional:      General: She is not in acute distress.    Appearance: Normal appearance. She is obese. She is not ill-appearing or diaphoretic.  HENT:     Head: Normocephalic and atraumatic.     Right Ear: External ear normal.     Left Ear: External ear normal.     Nose: Nose normal.     Mouth/Throat:     Mouth: Mucous membranes are moist.  Eyes:     General: No scleral icterus.       Right eye: No discharge.        Left eye: No discharge.  Cardiovascular:     Rate and Rhythm: Normal rate and regular rhythm.     Pulses: Normal pulses.     Heart sounds: Normal heart sounds.  Pulmonary:     Effort: Pulmonary effort is normal. No respiratory distress.     Breath sounds: Normal breath sounds.  Abdominal:     General: Abdomen is flat.     Palpations: Abdomen is soft.     Tenderness: There is generalized abdominal tenderness. There is no guarding or rebound. Negative signs include Murphy's sign.     Comments: Nonperitoneal abdomen   Musculoskeletal:        General: Normal range of motion.     Cervical back: Normal range of motion.     Right lower leg: No edema.     Left lower leg: No edema.  Skin:    General: Skin is warm and dry.     Capillary Refill: Capillary refill takes less than 2 seconds.  Neurological:     Mental Status: She is alert.  Psychiatric:        Mood and Affect: Mood normal.        Behavior: Behavior normal.    ED Results / Procedures / Treatments   Labs (all labs ordered are listed, but only abnormal results are  displayed) Labs Reviewed  COMPREHENSIVE METABOLIC PANEL - Abnormal; Notable for the following components:      Result Value   CO2 20 (*)    Glucose, Bld 351 (*)    BUN 5 (*)    Total Protein 8.2 (*)    Anion gap 19 (*)    All other components within normal limits  URINALYSIS, ROUTINE W REFLEX MICROSCOPIC - Abnormal; Notable for the following components:   Color, Urine STRAW (*)    Glucose, UA >=500 (*)    Ketones, ur 80 (*)    Bacteria, UA RARE (*)    All other components within normal limits  CBC WITH DIFFERENTIAL/PLATELET - Abnormal; Notable for the following components:   WBC 12.0 (*)    HCT 46.7 (*)    Neutro Abs 9.9 (*)    All other components within normal limits  BLOOD GAS, VENOUS - Abnormal; Notable for the following components:   pCO2, Ven 30 (*)    pO2, Ven 74 (*)    Bicarbonate 18.2 (*)    Acid-base deficit 5.6 (*)    All other components within normal limits  BETA-HYDROXYBUTYRIC ACID - Abnormal; Notable for the following components:   Beta-Hydroxybutyric Acid 5.86 (*)    All other components within normal limits  BASIC METABOLIC PANEL - Abnormal; Notable for the following components:   Sodium 134 (*)    CO2 15 (*)    Glucose, Bld 400 (*)    Anion gap 19 (*)    All other components within normal limits  CBG MONITORING, ED - Abnormal; Notable for the following components:   Glucose-Capillary 347 (*)    All other components within normal limits  CBG  MONITORING, ED - Abnormal; Notable for the following components:   Glucose-Capillary 355 (*)    All other components within normal limits  CBG MONITORING, ED - Abnormal; Notable for the following components:   Glucose-Capillary 401 (*)    All other components within normal limits  CBG MONITORING, ED - Abnormal; Notable for the following components:   Glucose-Capillary 360 (*)    All other components within normal limits  CBG MONITORING, ED - Abnormal; Notable for the following components:   Glucose-Capillary 319 (*)    All other components within normal limits  RESP PANEL BY RT-PCR (FLU A&B, COVID) ARPGX2  LIPASE, BLOOD  OSMOLALITY  HIV ANTIBODY (ROUTINE TESTING W REFLEX)  BASIC METABOLIC PANEL    EKG None  Radiology CT ABDOMEN PELVIS W CONTRAST  Result Date: 09/10/2021 CLINICAL DATA:  Evaluate for bowel obstruction. Status post gastric sleeve surgery. EXAM: CT ABDOMEN AND PELVIS WITH CONTRAST TECHNIQUE: Multidetector CT imaging of the abdomen and pelvis was performed using the standard protocol following bolus administration of intravenous contrast. RADIATION DOSE REDUCTION: This exam was performed according to the departmental dose-optimization program which includes automated exposure control, adjustment of the mA and/or kV according to patient size and/or use of iterative reconstruction technique. CONTRAST:  122mL OMNIPAQUE IOHEXOL 300 MG/ML  SOLN COMPARISON:  None. FINDINGS: Lower chest: No acute abnormality. Hepatobiliary: Focal fatty infiltration identified within the left hepatic lobe along the falciform ligament. Mild heterogeneous enhancement of the inferior right hepatic lobe may represent a geographic area of focal fatty deposition. No discrete liver lesions identified. Gallbladder appears within normal limits. No bile duct dilatation. Pancreas: Unremarkable. No pancreatic ductal dilatation or surrounding inflammatory changes. Spleen: Normal in size without focal abnormality.  Adrenals/Urinary Tract: Normal adrenal glands. No nephrolithiasis, hydronephrosis, or mass. Urinary bladder  is unremarkable. Stomach/Bowel: Postoperative change from sleeve gastrectomy identified. Stomach appears nondistended. No pathologic dilatation of the large or small bowel loops to suggest bowel obstruction. Colon appears diffusely decompressed with mild wall thickening which may reflect incomplete distension. No pericolonic inflammatory changes noted. The appendix is visualized and is within normal limits, image 30/6. Vascular/Lymphatic: Aortic atherosclerosis. No aneurysm. No abdominopelvic adenopathy. Reproductive: Uterus and adnexal structures are unremarkable. Other: There is a fat containing umbilical hernia which measures 3.4 cm, image 72/7. Areas of postoperative change within the ventral abdominal wall are identified in keeping with recent laparoscopic gastric sleeve resection. No free fluid or fluid collections identified. No signs of pneumoperitoneum. Musculoskeletal: No acute or significant osseous findings. IMPRESSION: 1. No evidence for bowel obstruction. 2. Postoperative change from recent laparoscopic gastric sleeve resection. 3. Fat containing umbilical hernia. 4. Aortic Atherosclerosis (ICD10-I70.0). Electronically Signed   By: Kerby Moors M.D.   On: 09/10/2021 08:33    Procedures .Critical Care Performed by: Jeanell Sparrow, DO Authorized by: Jeanell Sparrow, DO   Critical care provider statement:    Critical care time (minutes):  49   Critical care was necessary to treat or prevent imminent or life-threatening deterioration of the following conditions:  Endocrine crisis   Critical care was time spent personally by me on the following activities:  Development of treatment plan with patient or surrogate, discussions with consultants, evaluation of patient's response to treatment, examination of patient, ordering and review of laboratory studies, ordering and review of radiographic  studies, ordering and performing treatments and interventions, pulse oximetry, re-evaluation of patient's condition, review of old charts and obtaining history from patient or surrogate   Care discussed with: admitting provider      Medications Ordered in ED Medications  lactated ringers infusion (0 mLs Intravenous Stopped 09/10/21 1142)  insulin aspart protamine- aspart (NOVOLOG MIX 70/30) injection 10 Units (10 Units Subcutaneous Not Given 09/10/21 1141)  insulin regular, human (MYXREDLIN) 100 units/ 100 mL infusion (26 Units/hr Intravenous Rate/Dose Change 09/10/21 1441)  lactated ringers infusion ( Intravenous New Bag/Given 09/10/21 1240)  dextrose 5 % in lactated ringers infusion (has no administration in time range)  dextrose 50 % solution 0-50 mL (has no administration in time range)  atorvastatin (LIPITOR) tablet 80 mg (0 mg Oral Hold 09/10/21 1233)  metoprolol succinate (TOPROL-XL) 24 hr tablet 100 mg (0 mg Oral Hold 09/10/21 1234)  DULoxetine (CYMBALTA) DR capsule 60 mg (0 mg Oral Hold 09/10/21 1234)  apixaban (ELIQUIS) tablet 5 mg (0 mg Oral Hold 09/10/21 1234)  gabapentin (NEURONTIN) capsule 300 mg (0 mg Oral Hold 09/10/21 1234)  multivitamin with minerals tablet 1 tablet (0 tablets Oral Hold 09/10/21 1235)  acetaminophen (TYLENOL) tablet 650 mg (has no administration in time range)    Or  acetaminophen (TYLENOL) suppository 650 mg (has no administration in time range)  ondansetron (ZOFRAN) tablet 4 mg (has no administration in time range)    Or  ondansetron (ZOFRAN) injection 4 mg (has no administration in time range)  metFORMIN (GLUCOPHAGE) tablet 500 mg (has no administration in time range)  ondansetron (ZOFRAN-ODT) disintegrating tablet 4 mg (4 mg Oral Given 09/10/21 0404)  lactated ringers bolus 1,000 mL (0 mLs Intravenous Stopped 09/10/21 0725)  ondansetron (ZOFRAN) injection 4 mg (4 mg Intravenous Given 09/10/21 0609)  iohexol (OMNIPAQUE) 9 MG/ML oral solution (  Contrast Given  09/10/21 0731)  metoCLOPramide (REGLAN) injection 5 mg (5 mg Intravenous Given 09/10/21 0823)  diphenhydrAMINE (BENADRYL) injection 25 mg (25 mg  Intravenous Given 09/10/21 0819)  iohexol (OMNIPAQUE) 300 MG/ML solution 100 mL (100 mLs Intravenous Contrast Given 09/10/21 0758)  lactated ringers bolus 1,000 mL (0 mLs Intravenous Stopped 09/10/21 1252)  potassium chloride 10 mEq in 100 mL IVPB (0 mEq Intravenous Stopped 09/10/21 1443)    ED Course/ Medical Decision Making/ A&P                           Medical Decision Making Amount and/or Complexity of Data Reviewed Independent Historian:     Details: daughter External Data Reviewed: labs, radiology and notes. Labs: ordered. Radiology: ordered. Decision-making details documented in ED Course.  Risk Prescription drug management. Decision regarding hospitalization.    CC: Abdominal pain, nausea and vomiting  This patient presents to the Emergency Department for the above complaint. This involves an extensive number of treatment options and is a complaint that carries with it a high risk of complications and morbidity. Vital signs were reviewed. Serious etiologies considered.  Record review:  Previous records obtained and reviewed  > pt with recent admission for DKA at OSH earlier this month\  Additional history obtained from family at bedside  Medical and surgical history as noted above.   Work up as above, notable for:  Labs & imaging results that were available during my care of the patient were reviewed by me and considered in my medical decision making.    imaging studies which included CT abdomen pelvis were reviewed which showed no acute process, no obstruction.  Cardiac monitoring reviewed and interpreted personally which shows sinus tachycardia  Social determinants of health include - N/a  Management: Endo tool, insulin infusion, multiple antiemetics  Reassessment:  Labs were reviewed, patient with hyperglycemia, anion  gap, UA with ketones.  Beta Hydroxybutyrate is also elevated 5.86.  pH is 7.39.  Patient with nausea and vomiting.  She was initially able tolerate some ice chips but began vomiting shortly after.  Required multiple doses of antiemetics.  Patient with hyperglycemic crisis, less likely DKA or HHS. Started on Endo tool.  Insulin infusion via Endo tool.  Patient will require admission.  D/w with patient / family bedside who are agreeable.    Pt HDS.  Discussed with hospitalist Dr. Cathlean Sauer who accepts patient for admission       This chart was dictated using voice recognition software.  Despite best efforts to proofread,  errors can occur which can change the documentation meaning.         Final Clinical Impression(s) / ED Diagnoses Final diagnoses:  Hyperglycemic crisis due to diabetes mellitus ([place])  Intractable nausea and vomiting    Rx / DC Orders ED Discharge Orders     None         Jeanell Sparrow, DO 09/10/21 1547

## 2021-09-10 NOTE — ED Notes (Signed)
Upon assessment pt vomiting. EDP notified.

## 2021-09-10 NOTE — Assessment & Plan Note (Addendum)
-  CT abdomen and pelvis with no acute changes, lipase is not elevated. -Continue treatment with PPI twice a day -Continue management of gastroparesis with the use of Reglan and good control of diabetes.

## 2021-09-10 NOTE — Progress Notes (Signed)
1741 Blood sugar 176. Did not cross over onto Epic.

## 2021-09-10 NOTE — Assessment & Plan Note (Addendum)
-  Continue to follow heart healthy diet -Continue treatment with metoprolol -Reassess blood pressure and further adjust antihypertensive regimen.

## 2021-09-10 NOTE — Assessment & Plan Note (Addendum)
-  Continue with Cpap at night.

## 2021-09-11 DIAGNOSIS — K2970 Gastritis, unspecified, without bleeding: Secondary | ICD-10-CM

## 2021-09-11 DIAGNOSIS — E782 Mixed hyperlipidemia: Secondary | ICD-10-CM

## 2021-09-11 LAB — BASIC METABOLIC PANEL
Anion gap: 10 (ref 5–15)
Anion gap: 10 (ref 5–15)
BUN: 6 mg/dL (ref 6–20)
BUN: 5 mg/dL — ABNORMAL LOW (ref 6–20)
CO2: 21 mmol/L — ABNORMAL LOW (ref 22–32)
CO2: 19 mmol/L — ABNORMAL LOW (ref 22–32)
Calcium: 9.2 mg/dL (ref 8.9–10.3)
Calcium: 9 mg/dL (ref 8.9–10.3)
Chloride: 105 mmol/L (ref 98–111)
Chloride: 107 mmol/L (ref 98–111)
Creatinine, Ser: 0.54 mg/dL (ref 0.44–1.00)
Creatinine, Ser: 0.54 mg/dL (ref 0.44–1.00)
GFR, Estimated: >60 mL/min (ref >60)
GFR, Estimated: >60 mL/min (ref >60)
Glucose, Bld: 166 mg/dL — ABNORMAL HIGH (ref 70–99)
Glucose, Bld: 271 mg/dL — ABNORMAL HIGH (ref 70–99)
Potassium: 3.1 mmol/L — ABNORMAL LOW (ref 3.5–5.1)
Potassium: 4.2 mmol/L (ref 3.5–5.1)
Sodium: 136 mmol/L (ref 135–145)
Sodium: 136 mmol/L (ref 135–145)

## 2021-09-11 LAB — GLUCOSE, CAPILLARY
Glucose-Capillary: 119 mg/dL — ABNORMAL HIGH (ref 70–99)
Glucose-Capillary: 132 mg/dL — ABNORMAL HIGH (ref 70–99)
Glucose-Capillary: 131 mg/dL — ABNORMAL HIGH (ref 70–99)
Glucose-Capillary: 152 mg/dL — ABNORMAL HIGH (ref 70–99)
Glucose-Capillary: 148 mg/dL — ABNORMAL HIGH (ref 70–99)
Glucose-Capillary: 149 mg/dL — ABNORMAL HIGH (ref 70–99)
Glucose-Capillary: 183 mg/dL — ABNORMAL HIGH (ref 70–99)
Glucose-Capillary: 190 mg/dL — ABNORMAL HIGH (ref 70–99)
Glucose-Capillary: 174 mg/dL — ABNORMAL HIGH (ref 70–99)
Glucose-Capillary: 180 mg/dL — ABNORMAL HIGH (ref 70–99)
Glucose-Capillary: 251 mg/dL — ABNORMAL HIGH (ref 70–99)
Glucose-Capillary: 275 mg/dL — ABNORMAL HIGH (ref 70–99)
Glucose-Capillary: 264 mg/dL — ABNORMAL HIGH (ref 70–99)
Glucose-Capillary: 226 mg/dL — ABNORMAL HIGH (ref 70–99)
Glucose-Capillary: 165 mg/dL — ABNORMAL HIGH (ref 70–99)
Glucose-Capillary: 55 mg/dL — ABNORMAL LOW (ref 70–99)
Glucose-Capillary: 68 mg/dL — ABNORMAL LOW (ref 70–99)
Glucose-Capillary: 106 mg/dL — ABNORMAL HIGH (ref 70–99)

## 2021-09-11 LAB — HEMOGLOBIN A1C
Hgb A1c MFr Bld: 8.7 % — ABNORMAL HIGH (ref 4.8–5.6)
Mean Plasma Glucose: 202.99 mg/dL

## 2021-09-11 LAB — CBC
HCT: 42.3 % (ref 36.0–46.0)
Hemoglobin: 13.5 g/dL (ref 12.0–15.0)
MCH: 28.7 pg (ref 26.0–34.0)
MCHC: 31.9 g/dL (ref 30.0–36.0)
MCV: 90 fL (ref 80.0–100.0)
Platelets: 190 10*3/uL (ref 150–400)
RBC: 4.7 MIL/uL (ref 3.87–5.11)
RDW: 15.2 % (ref 11.5–15.5)
WBC: 20.2 10*3/uL — ABNORMAL HIGH (ref 4.0–10.5)
nRBC: 0 % (ref 0.0–0.2)

## 2021-09-11 LAB — MRSA NEXT GEN BY PCR, NASAL: MRSA by PCR Next Gen: NOT DETECTED

## 2021-09-11 MED ORDER — POTASSIUM CHLORIDE CRYS ER 20 MEQ PO TBCR
40.0000 meq | EXTENDED_RELEASE_TABLET | Freq: Once | ORAL | Status: AC
  Administered 2021-09-11: 40 meq via ORAL
  Filled 2021-09-11: qty 2

## 2021-09-11 MED ORDER — HYDRALAZINE HCL 20 MG/ML IJ SOLN
10.0000 mg | Freq: Four times a day (QID) | INTRAMUSCULAR | Status: DC | PRN
  Administered 2021-09-11 – 2021-09-12 (×2): 10 mg via INTRAVENOUS
  Filled 2021-09-11 (×2): qty 1

## 2021-09-11 MED ORDER — METOCLOPRAMIDE HCL 5 MG/ML IJ SOLN
10.0000 mg | Freq: Once | INTRAMUSCULAR | Status: AC
  Administered 2021-09-11: 10 mg via INTRAVENOUS
  Filled 2021-09-11: qty 2

## 2021-09-11 MED ORDER — INSULIN GLARGINE-YFGN 100 UNIT/ML ~~LOC~~ SOLN
20.0000 [IU] | Freq: Two times a day (BID) | SUBCUTANEOUS | Status: DC
  Administered 2021-09-11 – 2021-09-12 (×2): 20 [IU] via SUBCUTANEOUS
  Filled 2021-09-11 (×5): qty 0.2

## 2021-09-11 MED ORDER — POTASSIUM CHLORIDE 10 MEQ/100ML IV SOLN
10.0000 meq | INTRAVENOUS | Status: AC
  Administered 2021-09-11 (×4): 10 meq via INTRAVENOUS
  Filled 2021-09-11 (×3): qty 100

## 2021-09-11 MED ORDER — SODIUM CHLORIDE 0.9 % IV SOLN
12.5000 mg | Freq: Once | INTRAVENOUS | Status: AC
  Administered 2021-09-11: 12.5 mg via INTRAVENOUS
  Filled 2021-09-11: qty 0.5

## 2021-09-11 MED ORDER — INSULIN ASPART 100 UNIT/ML IJ SOLN
0.0000 [IU] | Freq: Three times a day (TID) | INTRAMUSCULAR | Status: DC
  Administered 2021-09-11: 3 [IU] via SUBCUTANEOUS
  Administered 2021-09-12: 5 [IU] via SUBCUTANEOUS
  Administered 2021-09-12: 8 [IU] via SUBCUTANEOUS

## 2021-09-11 MED ORDER — PROMETHAZINE HCL 25 MG/ML IJ SOLN
INTRAMUSCULAR | Status: AC
Start: 2021-09-11 — End: ?
  Filled 2021-09-11: qty 1

## 2021-09-11 MED ORDER — METOCLOPRAMIDE HCL 5 MG/ML IJ SOLN
5.0000 mg | Freq: Three times a day (TID) | INTRAMUSCULAR | Status: DC
  Administered 2021-09-11 – 2021-09-12 (×4): 5 mg via INTRAVENOUS
  Filled 2021-09-11 (×4): qty 2

## 2021-09-11 NOTE — Hospital Course (Signed)
°  Mrs. Eilts is being admitted to the hospital with the working diagnosis of diabetes ketoacidosis.   51 yo female with the past medical history of T2DM on insulin therapy who presented with nausea, vomiting and abdominal pain about 2 hours after dinner the night prior to hospitalization. Positive hyperglycemia, weakness and not able to tolerate po. On her initial physical examination her blood pressure 156/88, HR 86, RR 18 and oxygen saturation 98 on room air, dry mucous membranes, lungs clear to auscultation, heart with S1 and S2 tachycardic, abdomen soft and non tender and non pitting lower extremity edema.    NA 138, K 3,9, CL 99, bicarbonate 22. Glucose 352, BUN 5 and cr 0,62. Anion gap 19 Lipase 20 Wbc 12, hgb 14,4 hct 93,8 plt 200   Sars covid 19 negative   Urine analysis with SG 1.022 with >500 glucose    CT abdomen and pelvis with no bowel obstruction, no acute changes.   Patient was placed on insulin drip and IV fluids, with improvement in serum glucose and anion gap. Patient with persistent nausea and not tolerating PO well, not able to transition to sq insulin.

## 2021-09-11 NOTE — Progress Notes (Addendum)
Progress Note   Patient: Lindsey Vazquez [id number] DOB: 1971-05-01 DOA: 09/10/2021     1 DOS: the patient was seen and examined on 09/11/2021   Brief hospital course:   Mrs. Devoss is being admitted to the hospital with the working diagnosis of diabetes ketoacidosis.   51 yo female with the past medical history of T2DM on insulin therapy who presented with nausea, vomiting and abdominal pain about 2 hours after dinner the night prior to hospitalization. Positive hyperglycemia, weakness and not able to tolerate po. On her initial physical examination her blood pressure 156/88, HR 86, RR 18 and oxygen saturation 98 on room air, dry mucous membranes, lungs clear to auscultation, heart with S1 and S2 tachycardic, abdomen soft and non tender and non pitting lower extremity edema.    NA 138, K 3,9, CL 99, bicarbonate 22. Glucose 352, BUN 5 and cr 0,62. Anion gap 19 Lipase 20 Wbc 12, hgb 14,4 hct 93,8 plt 200   Sars covid 19 negative   Urine analysis with SG 1.022 with >500 glucose    CT abdomen and pelvis with no bowel obstruction, no acute changes.   Patient was placed on insulin drip and IV fluids, with improvement in serum glucose and anion gap. Patient with persistent nausea and not tolerating PO well, not able to transition to sq insulin.   Assessment and Plan: * DKA (diabetic ketoacidosis) ([place])- (present on admission) Patient with type 2 diabetes mellitus.   Continue to have nausea and not tolerating clear liquids. Patient has been on insulin drip and IV dextrose.   Glucose this am 166, with anion gap at 10. Plan to add metoclopramide tid with meals. When patient is able to tolerate po, will transition to SQ insulin.   Gastritis- (present on admission) CT abdomen and pelvis with no acute changes, lipase is not elevated.  Patient continue with nausea and not able to tolerate po. Plan to continue with pantoprazole IV and will add scheduled metoclopramide. Continue with  as needed zofran.   Essential hypertension, benign- (present on admission) Patient with reactive sinus tachycardia.  Resume metoprolol 100 mg XL this am. Continue with telemetry monitoring.   Paroxysmal atrial fibrillation ([place])- (present on admission) Patient with sinus tachycardia on telemetry monitor, personally reviewed. Plan to resume oral metoprolol and anticoagulation with apixaban.  Continue telemetry monitoring, keep K at 4 and Mg at 2.   Leukocytosis- (present on admission) Worsening leukocytosis at 20.2 with no signs of systemic bacterial infection, continue to hold on antibiotic therapy.   OSA (obstructive sleep apnea)- (present on admission) Continue with Cpap at night.   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  Patient with leukocytosis.   Mixed hyperlipidemia- (present on admission) On statin therapy.   Class 3 obesity ([place])- (present on admission) Calculated BMI is 47,3 Patient is sp sleeve gastrectomy about 6 weeks ago.         Subjective: Patient with persistent nausea and no able to tolerate po well, positive tachycardia, but no palpitations or dyspnea, no chest pain, no abdominal pain.   Physical Exam: Vitals:   09/11/21 0500 09/11/21 0600 09/11/21 0700 09/11/21 0800  BP: (!) 182/93 (!) 183/83 (!) 190/88 (!) 188/87  Pulse: (!) 114 (!) 108 (!) 115 (!) 130  Resp: (!) 22 (!) 21 (!) 21 (!) 22  Temp:      TempSrc:      SpO2: 94% 93% 92% 95%  Weight:      Height:  Neurology Awake and alert ENT with mild pallor, oral mucosa moist Cardiovascular with S1 and S2 present and tachycardic, regular rhythm, no gallops or murmurs No JVD Non pitting lower extremity edema, trace Respiratory with no wheezing, rales or rhonchi Abdomen protuberant but non tender   Data Reviewed:    Family Communication: no family at the bedside   Disposition: Status is: Inpatient Remains inpatient appropriate  because: IV insulin infusion      Planned Discharge Destination: Home   Author: Tawni Millers, MD 09/11/2021 9:22 AM  For on call review [email or web address].

## 2021-09-12 DIAGNOSIS — R112 Nausea with vomiting, unspecified: Secondary | ICD-10-CM

## 2021-09-12 DIAGNOSIS — E1165 Type 2 diabetes mellitus with hyperglycemia: Secondary | ICD-10-CM

## 2021-09-12 LAB — GLUCOSE, CAPILLARY
Glucose-Capillary: 107 mg/dL — ABNORMAL HIGH (ref 70–99)
Glucose-Capillary: 219 mg/dL — ABNORMAL HIGH (ref 70–99)
Glucose-Capillary: 291 mg/dL — ABNORMAL HIGH (ref 70–99)
Glucose-Capillary: 95 mg/dL (ref 70–99)

## 2021-09-12 LAB — CBC
HCT: 42.3 % (ref 36.0–46.0)
Hemoglobin: 13.9 g/dL (ref 12.0–15.0)
MCH: 29.6 pg (ref 26.0–34.0)
MCHC: 32.9 g/dL (ref 30.0–36.0)
MCV: 90.2 fL (ref 80.0–100.0)
Platelets: 148 10*3/uL — ABNORMAL LOW (ref 150–400)
RBC: 4.69 MIL/uL (ref 3.87–5.11)
RDW: 15.1 % (ref 11.5–15.5)
WBC: 15.7 10*3/uL — ABNORMAL HIGH (ref 4.0–10.5)
nRBC: 0 % (ref 0.0–0.2)

## 2021-09-12 LAB — BASIC METABOLIC PANEL
Anion gap: 9 (ref 5–15)
BUN: 5 mg/dL — ABNORMAL LOW (ref 6–20)
CO2: 23 mmol/L (ref 22–32)
Calcium: 9 mg/dL (ref 8.9–10.3)
Chloride: 102 mmol/L (ref 98–111)
Creatinine, Ser: 0.44 mg/dL (ref 0.44–1.00)
GFR, Estimated: >60 mL/min (ref >60)
Glucose, Bld: 148 mg/dL — ABNORMAL HIGH (ref 70–99)
Potassium: 3.3 mmol/L — ABNORMAL LOW (ref 3.5–5.1)
Sodium: 134 mmol/L — ABNORMAL LOW (ref 135–145)

## 2021-09-12 LAB — MAGNESIUM: Magnesium: 1.4 mg/dL — ABNORMAL LOW (ref 1.7–2.4)

## 2021-09-12 MED ORDER — ATORVASTATIN CALCIUM 20 MG PO TABS: 80.0000 mg | ORAL_TABLET | Freq: Every day | ORAL | Status: DC

## 2021-09-12 MED ORDER — METOCLOPRAMIDE HCL 5 MG PO TABS: 5.0000 mg | ORAL_TABLET | Freq: Three times a day (TID) | ORAL | 1 refills | Status: DC

## 2021-09-12 MED ORDER — POTASSIUM CHLORIDE CRYS ER 20 MEQ PO TBCR
40.0000 meq | EXTENDED_RELEASE_TABLET | Freq: Once | ORAL | Status: AC
  Administered 2021-09-12: 40 meq via ORAL
  Filled 2021-09-12: qty 2

## 2021-09-12 MED ORDER — PANTOPRAZOLE SODIUM 40 MG PO TBEC: 40.0000 mg | DELAYED_RELEASE_TABLET | Freq: Two times a day (BID) | ORAL | 1 refills | Status: DC

## 2021-09-12 MED ORDER — MAGNESIUM SULFATE 2 GM/50ML IV SOLN
2.0000 g | Freq: Once | INTRAVENOUS | Status: AC
  Administered 2021-09-12: 2 g via INTRAVENOUS
  Filled 2021-09-12: qty 50

## 2021-09-12 MED ORDER — TRAMADOL HCL 50 MG PO TABS: 50.0000 mg | ORAL_TABLET | Freq: Three times a day (TID) | ORAL | Status: DC | PRN

## 2021-09-12 MED ORDER — ASPIRIN 81 MG PO TBEC: 81.0000 mg | DELAYED_RELEASE_TABLET | Freq: Every day | ORAL | Status: DC

## 2021-09-12 MED ORDER — ACETAMINOPHEN 500 MG PO TABS: 500.0000 mg | ORAL_TABLET | Freq: Four times a day (QID) | ORAL | Status: AC | PRN

## 2021-09-12 MED ORDER — DOCUSATE SODIUM 100 MG PO CAPS: 100.0000 mg | ORAL_CAPSULE | Freq: Two times a day (BID) | ORAL | Status: DC | PRN

## 2021-09-12 MED ORDER — METFORMIN HCL 500 MG PO TABS
500.0000 mg | ORAL_TABLET | Freq: Two times a day (BID) | ORAL | 2 refills | Status: DC
Start: 2021-09-12 — End: 2021-10-29

## 2021-09-12 MED ORDER — ONDANSETRON 4 MG PO TBDP: 4.0000 mg | ORAL_TABLET | Freq: Three times a day (TID) | ORAL | 0 refills | Status: AC | PRN

## 2021-09-12 MED ORDER — GABAPENTIN 300 MG PO CAPS: 300.0000 mg | ORAL_CAPSULE | Freq: Every day | ORAL | Status: DC

## 2021-09-12 MED ORDER — TOUJEO MAX SOLOSTAR 300 UNIT/ML ~~LOC~~ SOPN: 45.0000 [IU] | PEN_INJECTOR | Freq: Every day | SUBCUTANEOUS | 1 refills | Status: DC

## 2021-09-12 MED ORDER — HYDROXYZINE PAMOATE 50 MG PO CAPS: 50.0000 mg | ORAL_CAPSULE | Freq: Every day | ORAL | Status: DC | PRN

## 2021-09-12 NOTE — Progress Notes (Signed)
Patient transported via wheelchair to main entrance with [employer], NT.

## 2021-09-12 NOTE — Progress Notes (Signed)
Discharge paperwork and information provided to patient. Pt voices no questions or concerns. Belongings returned to patient's satisfaction. PIV removed, pressure applied, and guaze placed with tape. Pt awaiting ride.

## 2021-09-12 NOTE — TOC Progression Note (Signed)
°  Transition of Care (TOC) Screening Note   Patient Details  Name: Lindsey Vazquez Date of Birth: 1970/12/17   Transition of Care [facility]) CM/SW Contact:    Shade Flood, LCSW Phone Number: 09/12/2021, 11:04 AM    Transition of Care Department [facility]) has reviewed patient and no TOC needs have been identified at this time. We will continue to monitor patient advancement through interdisciplinary progression rounds. If new patient transition needs arise, please place a TOC consult.

## 2021-09-12 NOTE — Discharge Summary (Signed)
Physician Discharge Summary   Patient: Lindsey Vazquez MRN: [id number] DOB: 31-Dec-1970  Admit date:     09/10/2021  Discharge date: 09/12/21  Discharge Physician: Barton Dubois   PCP: Renee Rival, NP   Recommendations at discharge:  Repeat BMET to follow electrolytes and renal function. Reassess BP and adjust antihypertensive regimen Close monitoring of CBG's with further adjustment to hypoglycemic regimen. Monitor response to Reglan and adjust dose as needed. Continue assisting patient with weight loss management. Repeat CBC to follow resolution of WBC's elevation.   Discharge Diagnoses: Principal Problem:   DKA (diabetic ketoacidosis) ([place]) Active Problems:   Leukocytosis   MPN (myeloproliferative neoplasm) (HCC)   Hyperglycemic crisis due to diabetes mellitus (HCC)   Essential hypertension, benign   Mixed hyperlipidemia   OSA (obstructive sleep apnea)   Class 3 obesity (HCC)   Paroxysmal atrial fibrillation (HCC)   Gastritis   Intractable nausea and vomiting   Hospital Course:   [name] is being admitted to the hospital with the working diagnosis of diabetes ketoacidosis.   51 yo female with the past medical history of T2DM on insulin therapy who presented with nausea, vomiting and abdominal pain about 2 hours after dinner the night prior to hospitalization. Positive hyperglycemia, weakness and not able to tolerate po. On her initial physical examination her blood pressure 156/88, HR 86, RR 18 and oxygen saturation 98 on room air, dry mucous membranes, lungs clear to auscultation, heart with S1 and S2 tachycardic, abdomen soft and non tender and non pitting lower extremity edema.    NA 138, K 3,9, CL 99, bicarbonate 22. Glucose 352, BUN 5 and cr 0,62. Anion gap 19 Lipase 20 Wbc 12, hgb 14,4 hct 93,8 plt 200   Sars covid 19 negative   Urine analysis with SG 1.022 with >500 glucose    CT abdomen and pelvis with no bowel obstruction, no acute changes.    Patient was placed on insulin drip and IV fluids, with improvement in serum glucose and anion gap.  Patient was transitioned to [facility] insulin and dose adjusted. Patient tolerating diet after been initiated on reglan.  Assessment and Plan: * DKA (diabetic ketoacidosis) ([place])- (present on admission) -Patient with type 2 diabetes mellitus.  -Presented with DKA and flare of gastroparesis. -After resolution of DKA process, patient started on Reglan with adequate diet tolerance. -Insulin therapy has been adjusted at discharge -Patient will resume the use of metformin as well. -Continue close CBGs monitoring and further adjust the dose of Reglan as needed to further control gastroparesis symptoms.   Intractable nausea and vomiting - In the setting of gastroparesis -Improved with the use of Reglan -Continue as needed antiemetics -Continue PPI.  Gastritis- (present on admission) -CT abdomen and pelvis with no acute changes, lipase is not elevated. -Continue treatment with PPI twice a day -Continue management of gastroparesis with the use of Reglan and good control of diabetes.  Paroxysmal atrial fibrillation ([place])- (present on admission) -Continue metoprolol and apixaban for secondary prevention -Follow electrolytes (especially potassium and magnesium) goal is to keep potassium above 4 and magnesium above 2. -Patient advised to maintain adequate hydration.      Class 3 obesity ([place])- (present on admission) -Calculated BMI is 46.49 -Patient is s/p sleeve gastrectomy about 6 weeks ago.  -Continue to follow with bariatric service as an outpatient. -Low calorie diet, portion control and increase physical activity discussed with patient.  OSA (obstructive sleep apnea)- (present on admission) -Continue with Cpap at night.  Mixed hyperlipidemia- (present on admission) -Continue the use of statins -Healthy diet discussed with patient.    Essential hypertension, benign- (present on  admission) -Continue to follow heart healthy diet -Continue treatment with metoprolol -Reassess blood pressure and further adjust antihypertensive regimen.  MPN (myeloproliferative neoplasm) ([place])- (present on admission) -Follow up as outpatient with Hem/Onc..  -Patient with leukocytosis.   Leukocytosis- (present on admission) -Worsening leukocytosis at 20.2 with no signs of systemic bacterial infection, continue to hold on antibiotic therapy.  -In the setting of stress demargination most likely; repeat CBC at follow-up visit to assure resolution of WBCs elevation. -Continue to maintain adequate hydration.   Consultants: None Procedures performed: See below for x-ray reports Disposition: Home Diet recommendation:  Discharge Diet Orders (From admission, onward)     Start     Ordered   09/12/21 0000  Diet - low sodium heart healthy        09/12/21 1053   09/12/21 0000  Diet Carb Modified        09/12/21 1053           Cardiac and Carb modified diet  DISCHARGE MEDICATION: Allergies as of 09/12/2021   No Known Allergies      Medication List     STOP taking these medications    acyclovir ointment 5 % Commonly known as: ZOVIRAX   glipiZIDE 5 MG 24 hr tablet Commonly known as: GLUCOTROL XL   ibuprofen 800 MG tablet Commonly known as: ADVIL   metoprolol tartrate 25 MG tablet Commonly known as: LOPRESSOR   potassium chloride SA 20 MEQ tablet Commonly known as: KLOR-CON M   scopolamine 1 MG/3DAYS Commonly known as: TRANSDERM-SCOP   torsemide 20 MG tablet Commonly known as: DEMADEX   Valtrex 1000 MG tablet Generic drug: valACYclovir       TAKE these medications    acetaminophen 500 MG tablet Commonly known as: TYLENOL Take by mouth.   aspirin 81 MG EC tablet Take by mouth.   atorvastatin 20 MG tablet Commonly known as: LIPITOR Take 0.5 tablets (10 mg total) by mouth daily. What changed:  how much to take Another medication with the same name  was removed. Continue taking this medication, and follow the directions you see here.   Colace 100 MG capsule Generic drug: docusate sodium   Cyanocobalamin 500 MCG/0.1ML Soln Place into the nose.   DULoxetine 60 MG capsule Commonly known as: CYMBALTA Take 60 mg by mouth daily. What changed: Another medication with the same name was removed. Continue taking this medication, and follow the directions you see here.   Eliquis 5 MG Tabs tablet Generic drug: apixaban Take 5 mg by mouth 2 (two) times daily.   FreeStyle Libre 2 Reader Kerrin Mo As directed   [employer] 2 Sensor Misc 1 Piece by Does not apply route every 14 (fourteen) days.   gabapentin 300 MG capsule Commonly known as: NEURONTIN Take by mouth. What changed: Another medication with the same name was removed. Continue taking this medication, and follow the directions you see here.   GNP Vitamin B-1 100 MG tablet Generic drug: thiamine Take 100 mg by mouth daily.   HumaLOG 100 UNIT/ML injection Generic drug: insulin lispro   hydrOXYzine 50 MG capsule Commonly known as: VISTARIL Take 50 mg by mouth daily as needed.   MAGNESIUM OXIDE (ANTACID) PO Take 1 tablet by mouth daily in the afternoon.   magnesium oxide 400 (240 Mg) MG tablet Commonly known as: MAG-OX Take 1 tablet by mouth  daily.   metFORMIN 500 MG tablet Commonly known as: GLUCOPHAGE TAKE (1) TABLET BY MOUTH TWICE A DAY WITH MEALS (BREAKFAST AND SUPPER) What changed: Another medication with the same name was removed. Continue taking this medication, and follow the directions you see here.   metoCLOPramide 5 MG tablet Commonly known as: Reglan Take 1 tablet (5 mg total) by mouth 3 (three) times daily.   metoprolol succinate 100 MG 24 hr tablet Commonly known as: TOPROL-XL Take 100 mg by mouth daily.   multivitamin tablet Take 1 tablet by mouth daily.   ondansetron 4 MG disintegrating tablet Commonly known as: ZOFRAN-ODT Take by mouth.    pantoprazole 40 MG tablet Commonly known as: Protonix Take 1 tablet (40 mg total) by mouth 2 (two) times daily.   polyethylene glycol powder 17 GM/SCOOP powder Commonly known as: GLYCOLAX/MIRALAX Take by mouth.   Toujeo Max SoloStar 300 UNIT/ML Solostar Pen Generic drug: insulin glargine (2 Unit Dial) Inject 45 Units into the skin daily. What changed:  how much to take when to take this   traMADol 50 MG tablet Commonly known as: ULTRAM   Vitamin D-3 125 MCG (5000 UT) Tabs Take 5,000 Units by mouth once a week.        Follow-up Information     Renee Rival, NP. Schedule an appointment as soon as possible for a visit in 10 day(s).   Specialty: Nurse Practitioner Contact information: [address] [place] [address] [phone]                 Discharge Exam: Filed Weights   09/10/21 0401 09/10/21 1750  Weight: 113.9 kg 111.6 kg   General exam: Alert, awake, oriented x 3; reporting just mild nausea.  Tolerating diet and with good control in her CBGs with adjusted dose of insulin therapy.  Patient is afebrile, no chest pain, no shortness of breath. Respiratory system: Clear to auscultation. Respiratory effort normal.  Good saturation on room air.  No using accessory muscle. Cardiovascular system:No murmurs, rubs, gallops.  Unable to assess JVD with body habitus. Gastrointestinal system: Abdomen is obese, nondistended, soft and nontender. No organomegaly or masses felt. Normal bowel sounds heard. Central nervous system: Alert and oriented. No focal neurological deficits. Extremities: No cyanosis or clubbing. Skin: No petechiae. Psychiatry: Judgement and insight appear normal. Mood & affect appropriate.    Condition at discharge: Stable and improved.  The results of significant diagnostics from this hospitalization (including imaging, microbiology, ancillary and laboratory) are listed below for reference.   Imaging Studies: CT ABDOMEN PELVIS W  CONTRAST  Result Date: 09/10/2021 CLINICAL DATA:  Evaluate for bowel obstruction. Status post gastric sleeve surgery. EXAM: CT ABDOMEN AND PELVIS WITH CONTRAST TECHNIQUE: Multidetector CT imaging of the abdomen and pelvis was performed using the standard protocol following bolus administration of intravenous contrast. RADIATION DOSE REDUCTION: This exam was performed according to the departmental dose-optimization program which includes automated exposure control, adjustment of the mA and/or kV according to patient size and/or use of iterative reconstruction technique. CONTRAST:  167mL OMNIPAQUE IOHEXOL 300 MG/ML  SOLN COMPARISON:  None. FINDINGS: Lower chest: No acute abnormality. Hepatobiliary: Focal fatty infiltration identified within the left hepatic lobe along the falciform ligament. Mild heterogeneous enhancement of the inferior right hepatic lobe may represent a geographic area of focal fatty deposition. No discrete liver lesions identified. Gallbladder appears within normal limits. No bile duct dilatation. Pancreas: Unremarkable. No pancreatic ductal dilatation or surrounding inflammatory changes. Spleen: Normal in size without focal  abnormality. Adrenals/Urinary Tract: Normal adrenal glands. No nephrolithiasis, hydronephrosis, or mass. Urinary bladder is unremarkable. Stomach/Bowel: Postoperative change from sleeve gastrectomy identified. Stomach appears nondistended. No pathologic dilatation of the large or small bowel loops to suggest bowel obstruction. Colon appears diffusely decompressed with mild wall thickening which may reflect incomplete distension. No pericolonic inflammatory changes noted. The appendix is visualized and is within normal limits, image 30/6. Vascular/Lymphatic: Aortic atherosclerosis. No aneurysm. No abdominopelvic adenopathy. Reproductive: Uterus and adnexal structures are unremarkable. Other: There is a fat containing umbilical hernia which measures 3.4 cm, image 72/7. Areas of  postoperative change within the ventral abdominal wall are identified in keeping with recent laparoscopic gastric sleeve resection. No free fluid or fluid collections identified. No signs of pneumoperitoneum. Musculoskeletal: No acute or significant osseous findings. IMPRESSION: 1. No evidence for bowel obstruction. 2. Postoperative change from recent laparoscopic gastric sleeve resection. 3. Fat containing umbilical hernia. 4. Aortic Atherosclerosis (ICD10-I70.0). Electronically Signed   By: Kerby Moors M.D.   On: 09/10/2021 08:33    Microbiology: Results for orders placed or performed during the hospital encounter of 09/10/21  Resp Panel by RT-PCR (Flu A&B, Covid) Nasopharyngeal Swab     Status: None   Collection Time: 09/10/21  7:32 AM   Specimen: Nasopharyngeal Swab; Nasopharyngeal(NP) swabs in vial transport medium  Result Value Ref Range Status   SARS Coronavirus 2 by RT PCR NEGATIVE NEGATIVE Final    Comment: (NOTE) SARS-CoV-2 target nucleic acids are NOT DETECTED.  The SARS-CoV-2 RNA is generally detectable in upper respiratory specimens during the acute phase of infection. The lowest concentration of SARS-CoV-2 viral copies this assay can detect is 138 copies/mL. A negative result does not preclude SARS-Cov-2 infection and should not be used as the sole basis for treatment or other patient management decisions. A negative result may occur with  improper specimen collection/handling, submission of specimen other than nasopharyngeal swab, presence of viral mutation(s) within the areas targeted by this assay, and inadequate number of viral copies(<138 copies/mL). A negative result must be combined with clinical observations, patient history, and epidemiological information. The expected result is Negative.  Fact Sheet for Patients:  EntrepreneurPulse.com.au  Fact Sheet for Healthcare Providers:  IncredibleEmployment.be  This test is no t yet  approved or cleared by the [place] FDA and  has been authorized for detection and/or diagnosis of SARS-CoV-2 by FDA under an Emergency Use Authorization (EUA). This EUA will remain  in effect (meaning this test can be used) for the duration of the COVID-19 declaration under Section 564(b)(1) of the Act, 21 U.S.C.section 360bbb-3(b)(1), unless the authorization is terminated  or revoked sooner.       Influenza A by PCR NEGATIVE NEGATIVE Final   Influenza B by PCR NEGATIVE NEGATIVE Final    Comment: (NOTE) The Xpert Xpress SARS-CoV-2/FLU/RSV plus assay is intended as an aid in the diagnosis of influenza from Nasopharyngeal swab specimens and should not be used as a sole basis for treatment. Nasal washings and aspirates are unacceptable for Xpert Xpress SARS-CoV-2/FLU/RSV testing.  Fact Sheet for Patients: EntrepreneurPulse.com.au  Fact Sheet for Healthcare Providers: IncredibleEmployment.be  This test is not yet approved or cleared by the [place] FDA and has been authorized for detection and/or diagnosis of SARS-CoV-2 by FDA under an Emergency Use Authorization (EUA). This EUA will remain in effect (meaning this test can be used) for the duration of the COVID-19 declaration under Section 564(b)(1) of the Act, 21 U.S.C. section 360bbb-3(b)(1), unless the authorization is terminated or  revoked.  Performed at [facility], [address] [place], [place] [address]   MRSA Next Gen by PCR, Nasal     Status: None   Collection Time: 09/10/21  5:45 PM   Specimen: Nasal Mucosa; Nasal Swab  Result Value Ref Range Status   MRSA by PCR Next Gen NOT DETECTED NOT DETECTED Final    Comment: (NOTE) The GeneXpert MRSA Assay (FDA approved for NASAL specimens only), is one component of a comprehensive MRSA colonization surveillance program. It is not intended to diagnose MRSA infection nor to guide or monitor treatment for MRSA infections. Test  performance is not FDA approved in patients less than 67 years old. Performed at [facility], [address], [place], [place] [address]     Labs: CBC: Recent Labs  Lab 09/10/21 0531 09/11/21 0550 09/12/21 0345  WBC 12.0* 20.2* 15.7*  NEUTROABS 9.9*  --   --   HGB 14.4 13.5 13.9  HCT 46.7* 42.3 42.3  MCV 93.8 90.0 90.2  PLT 200 190 329*   Basic Metabolic Panel: Recent Labs  Lab 09/10/21 1551 09/10/21 2149 09/11/21 0550 09/11/21 1522 09/12/21 0345  NA 135 137 136 136 134*  K 4.7 3.7 3.1* 4.2 3.3*  CL 103 107 105 107 102  CO2 13* 21* 21* 19* 23  GLUCOSE 317* 122* 166* 271* 148*  BUN 6 6 6  5* 5*  CREATININE 0.83 0.73 0.54 0.54 0.44  CALCIUM 9.2 9.1 9.2 9.0 9.0  MG  --   --   --   --  1.4*   Liver Function Tests: Recent Labs  Lab 09/10/21 0531  AST 31  ALT 30  ALKPHOS 108  BILITOT 1.0  PROT 8.2*  ALBUMIN 4.0   CBG: Recent Labs  Lab 09/11/21 2200 09/11/21 2245 09/12/21 0022 09/12/21 0226 09/12/21 0731  GLUCAP 68* 106* 95 107* 291*    Discharge time spent: greater than 30 minutes.  Signed: Barton Dubois, MD [facility] 09/12/2021

## 2021-09-12 NOTE — Assessment & Plan Note (Signed)
-   In the setting of gastroparesis -Improved with the use of Reglan -Continue as needed antiemetics -Continue PPI.

## 2021-09-23 ENCOUNTER — Telehealth: Payer: Self-pay

## 2021-09-23 NOTE — Telephone Encounter (Signed)
Started and sent PA for [place] 2 sensors to insurance through [employer]. ?

## 2021-09-29 ENCOUNTER — Encounter: Payer: Self-pay | Admitting: "Endocrinology

## 2021-09-29 ENCOUNTER — Ambulatory Visit (INDEPENDENT_AMBULATORY_CARE_PROVIDER_SITE_OTHER): Payer: 59 | Admitting: "Endocrinology

## 2021-09-29 ENCOUNTER — Other Ambulatory Visit: Payer: Self-pay

## 2021-09-29 VITALS — BP 110/68 | HR 96 | Ht 61.0 in | Wt 235.0 lb

## 2021-09-29 DIAGNOSIS — I10 Essential (primary) hypertension: Secondary | ICD-10-CM | POA: Diagnosis not present

## 2021-09-29 DIAGNOSIS — E1165 Type 2 diabetes mellitus with hyperglycemia: Secondary | ICD-10-CM

## 2021-09-29 DIAGNOSIS — E559 Vitamin D deficiency, unspecified: Secondary | ICD-10-CM

## 2021-09-29 DIAGNOSIS — E782 Mixed hyperlipidemia: Secondary | ICD-10-CM | POA: Diagnosis not present

## 2021-09-29 MED ORDER — TOUJEO MAX SOLOSTAR 300 UNIT/ML ~~LOC~~ SOPN: 34.0000 [IU] | PEN_INJECTOR | Freq: Every day | SUBCUTANEOUS | 1 refills | Status: DC

## 2021-09-29 NOTE — Progress Notes (Signed)
09/29/2021, 6:17 PM                                                     Endocrinology follow-up note   Subjective:    Patient ID: Lindsey Vazquez, female    DOB: 06-07-1971.  Lindsey Vazquez is being seen in follow-up for management of currently uncontrolled symptomatic type 2 diabetes, hyperlipidemia, vitamin D deficiency. PCP:   Renee Rival, NP.   Past Medical History:  Diagnosis Date   Diabetes mellitus without complication ([place])    Hyperlipemia    Past Surgical History:  Procedure Laterality Date   c-section     CARDIAC CATHETERIZATION  07/2021   LAPAROSCOPIC GASTRIC SLEEVE RESECTION  08/11/2021   Social History   Socioeconomic History   Marital status: Married    Spouse name: Not on file   Number of children: Not on file   Years of education: Not on file   Highest education level: Not on file  Occupational History   Not on file  Tobacco Use   Smoking status: Former    Types: Cigarettes    Quit date: 07/25/2015    Years since quitting: 6.1   Smokeless tobacco: Never  Vaping Use   Vaping Use: Former  Substance and Sexual Activity   Alcohol use: No    Comment: socially   Drug use: No   Sexual activity: Yes  Other Topics Concern   Not on file  Social History Narrative   Not on file   Social Determinants of Health   Financial Resource Strain: Not on file  Food Insecurity: Not on file  Transportation Needs: Not on file  Physical Activity: Not on file  Stress: Not on file  Social Connections: Not on file   Outpatient Encounter Medications as of 09/29/2021  Medication Sig   acetaminophen (TYLENOL) 500 MG tablet Take 1 tablet (500 mg total) by mouth every 6 (six) hours as needed for mild pain, fever or headache.   atorvastatin (LIPITOR) 20 MG tablet Take 4 tablets (80 mg total) by mouth daily.   Continuous Blood Gluc Receiver (FREESTYLE LIBRE 2 READER) DEVI As directed   Continuous Blood Gluc Sensor (FREESTYLE  LIBRE 2 SENSOR) MISC 1 Piece by Does not apply route every 14 (fourteen) days.   docusate sodium (COLACE) 100 MG capsule Take 1 capsule (100 mg total) by mouth 2 (two) times daily as needed for mild constipation.   DULoxetine (CYMBALTA) 60 MG capsule Take 60 mg by mouth daily.   ELIQUIS 5 MG TABS tablet Take 5 mg by mouth 2 (two) times daily.   GNP VITAMIN B-1 100 MG tablet Take 100 mg by mouth daily.   hydrOXYzine (VISTARIL) 50 MG capsule Take 1 capsule (50 mg total) by mouth daily as needed for anxiety or nausea.   insulin glargine, 2 Unit Dial, (TOUJEO MAX SOLOSTAR) 300 UNIT/ML Solostar Pen Inject 34 Units into the skin daily.   insulin lispro (HUMALOG) 100 UNIT/ML injection    magnesium oxide (MAG-OX) 400 (240 Mg) MG tablet Take 1 tablet by mouth daily.   MAGNESIUM OXIDE, ANTACID, PO Take 1 tablet by mouth daily in the afternoon.   metFORMIN (GLUCOPHAGE) 500 MG tablet Take 1 tablet (500 mg total) by mouth 2 (  two) times daily with a meal.   metoCLOPramide (REGLAN) 5 MG tablet Take 1 tablet (5 mg total) by mouth 3 (three) times daily.   metoprolol succinate (TOPROL-XL) 100 MG 24 hr tablet Take 50 mg by mouth daily.   Multiple Vitamin (MULTIVITAMIN) tablet Take 1 tablet by mouth daily.   pantoprazole (PROTONIX) 40 MG tablet Take 1 tablet (40 mg total) by mouth 2 (two) times daily.   [DISCONTINUED] aspirin 81 MG EC tablet Take 1 tablet (81 mg total) by mouth daily.   [DISCONTINUED] gabapentin (NEURONTIN) 300 MG capsule Take 1 capsule (300 mg total) by mouth at bedtime.   [DISCONTINUED] insulin glargine, 2 Unit Dial, (TOUJEO MAX SOLOSTAR) 300 UNIT/ML Solostar Pen Inject 45 Units into the skin daily. (Patient taking differently: Inject 40 Units into the skin daily.)   [DISCONTINUED] traMADol (ULTRAM) 50 MG tablet Take 1 tablet (50 mg total) by mouth every 8 (eight) hours as needed for severe pain.   No facility-administered encounter medications on file as of 09/29/2021.    ALLERGIES: No Known  Allergies  VACCINATION STATUS:  There is no immunization history on file for this patient.  Diabetes She presents for her follow-up diabetic visit. She has type 2 diabetes mellitus. Onset time: Diagnosed at approximate age of 35 years, after episode of gestational diabetes. Her disease course has been improving. There are no hypoglycemic associated symptoms. Pertinent negatives for hypoglycemia include no confusion, headaches, pallor or seizures. Associated symptoms include foot paresthesias, polydipsia and polyphagia. Pertinent negatives for diabetes include no chest pain, no fatigue and no polyuria. There are no hypoglycemic complications. Symptoms are improving (She was recently hospitalized for DKA.). Diabetic complications include nephropathy. (Diabetes ketoacidosis.  She had acute renal failure which required brief.  Of hemodialysis during her hospitalization.) Risk factors for coronary artery disease include diabetes mellitus, dyslipidemia, hypertension, family history, obesity, sedentary lifestyle and tobacco exposure. Current diabetic treatment includes insulin injections. Her weight is decreasing steadily. She is following a generally unhealthy diet. When asked about meal planning, she reported none. She rarely participates in exercise. Her home blood glucose trend is decreasing steadily. Her breakfast blood glucose range is generally 140-180 mg/dl. Her lunch blood glucose range is generally 140-180 mg/dl. Her dinner blood glucose range is generally 140-180 mg/dl. Her bedtime blood glucose range is generally 140-180 mg/dl. Her overall blood glucose range is 140-180 mg/dl. (She underwent sleeve gastrectomy on August 11, 2021.  This resulted in loss of 40 pounds.   She presents with her CGM showing continued improvement in her glycemic profile   with  48% target range, 48% slightly above range.  No hypoglycemia. ) An ACE inhibitor/angiotensin II receptor blocker is not being taken.   Hyperlipidemia This is a chronic problem. The current episode started more than 1 year ago. The problem is uncontrolled. Exacerbating diseases include diabetes and obesity. Pertinent negatives include no chest pain, myalgias or shortness of breath. Current antihyperlipidemic treatment includes statins. Risk factors for coronary artery disease include dyslipidemia, diabetes mellitus, hypertension, family history, obesity and a sedentary lifestyle.  Hypertension This is a chronic problem. The current episode started more than 1 year ago. Pertinent negatives include no chest pain, headaches, palpitations or shortness of breath. Risk factors for coronary artery disease include diabetes mellitus, dyslipidemia, sedentary lifestyle, smoking/tobacco exposure and obesity.   Review of systems  Constitutional: + Minimally fluctuating body weight,  current  Body mass index is 44.4 kg/m. , no fatigue, no subjective hyperthermia, no subjective hypothermia   Objective:  BP 110/68    Pulse 96    Ht '5\' 1"'$  (1.549 m)    Wt 235 lb (106.6 kg)    BMI 44.40 kg/m   Wt Readings from Last 3 Encounters:  09/29/21 235 lb (106.6 kg)  09/10/21 246 lb 0.5 oz (111.6 kg)  09/01/21 263 lb 6.4 oz (119.5 kg)      Physical Exam- Limited  Constitutional:  Body mass index is 44.4 kg/m. , not in acute distress, normal state of mind Abdomen: Skin exam is not convincing for the amount of insulin she is given to inject.   CMP     Component Value Date/Time   NA 134 (L) 09/12/2021 0345   NA 141 09/23/2020 0803   K 3.3 (L) 09/12/2021 0345   CL 102 09/12/2021 0345   CO2 23 09/12/2021 0345   GLUCOSE 148 (H) 09/12/2021 0345   BUN 5 (L) 09/12/2021 0345   BUN 11 09/23/2020 0803   CREATININE 0.44 09/12/2021 0345   CREATININE 0.55 12/29/2019 1004   CALCIUM 9.0 09/12/2021 0345   PROT 8.2 (H) 09/10/2021 0531   PROT 8.0 09/23/2020 0803   ALBUMIN 4.0 09/10/2021 0531   ALBUMIN 4.7 09/23/2020 0803   AST 31 09/10/2021 0531    ALT 30 09/10/2021 0531   ALKPHOS 108 09/10/2021 0531   BILITOT 1.0 09/10/2021 0531   BILITOT 0.3 09/23/2020 0803   GFRNONAA >60 09/12/2021 0345   GFRNONAA 109 12/29/2019 1004   GFRAA 127 12/29/2019 1004   Lipid Panel     Component Value Date/Time   CHOL 164 12/29/2019 1004   TRIG 75 12/29/2019 1004   HDL 49 (L) 12/29/2019 1004   CHOLHDL 3.3 12/29/2019 1004   LDLCALC 99 12/29/2019 1004     Assessment & Plan:   1. Uncontrolled type 2 diabetes mellitus with hyperglycemia (HCC)  - Catarina G Bhola has currently uncontrolled symptomatic type 2 DM since 51 years of age.  She underwent sleeve gastrectomy on August 11, 2021.  This resulted in loss of 40 pounds.   She presents with her CGM showing continued improvement in her glycemic profile   with  48% target range, 48% slightly above range.  No hypoglycemia.   -She did not have  hypoglycemic episodes since last visit.   Recent labs reviewed, which also shows significant vitamin D deficiency.  -her diabetes is complicated by DKA, obesity, and sedentary life and she remains at a high risk for more acute and chronic complications which include CAD, CVA, CKD, retinopathy, and neuropathy. These are all discussed in detail with her.  - I have counseled her on diet management and weight loss, by adopting a carbohydrate restricted/protein rich diet. - she acknowledges that there is a room for improvement in her food and drink choices. - Suggestion is made for her to avoid simple carbohydrates  from her diet including Cakes, Sweet Desserts, Ice Cream, Soda (diet and regular), Sweet Tea, Candies, Chips, Cookies, Store Bought Juices, Alcohol , Artificial Sweeteners,  Coffee Creamer, and "Sugar-free" Products, Lemonade. This will help patient to have more stable blood glucose profile and potentially avoid unintended weight gain.  The following Lifestyle Medicine recommendations according to [place]  [facility]) were  discussed and and offered to patient and she  agrees to start the journey:  A. Whole Foods, Plant-Based Nutrition comprising of fruits and vegetables, plant-based proteins, whole-grain carbohydrates was discussed in detail with the patient.   A list for source of those nutrients were  also provided to the patient.  Patient will use only water or unsweetened tea for hydration. B.  The need to stay away from risky substances including alcohol, smoking; obtaining 7 to 9 hours of restorative sleep, at least 150 minutes of moderate intensity exercise weekly, the importance of healthy social connections,  and stress management techniques were discussed. C.  A full color page of  Calorie density of various food groups per pound showing examples of each food groups was provided to the patient.    - she is advised to stick to a routine mealtimes to eat 3 meals  a day and avoid unnecessary snacks ( to snack only to correct hypoglycemia).   - I have approached her with the following individualized plan to manage diabetes and patient agrees:   -In light of her presentation with tightening glycemic profile, 40 pounds of weight loss status post sleeve gastrectomy, she will need less insulin.  She is advised to stop Humalog and glipizide.  She is advised to decrease  her Toujeo  to 34 units nightly.  She is advised to continue  metformin 500 mg p.o. twice daily.   She is encouraged to use her CGM continuously.  - she is encouraged to call clinic for blood glucose levels less than 70 or above 200 mg /dl.   -Advised to stay away from SGLT2 inhibitors given her recent history of euglycemic DKA while taking Jardiance.   - Patient specific target  A1c;  LDL, HDL, Triglycerides were discussed in detail.  2) BP/HTN:   Her blood pressure is controlled to target.    she is currently not taking any blood pressure medications.    3) Lipids/HPL: Her recent lipid panel shows controlled LDL at 82.  She is advised to  continue atorvastatin 10 mg p.o. nightly.      Side effects and precautions discussed with her.  4) vitamin  D deficiency- 25 hydroxy vitamin D remains low despite a course of treatment with vitamin D3 5000 units for 90 days .she is approached for retreatment with vitamin D3 5000 units daily for the next 90 days.    5) weight management: Her BMI is 44.4, lowering from 49.7 - she is status post sleeve gastrectomy on August 11, 2021.    6) hypercalcemia: Associated with high PTH.  Her 24-hour urine calcium is not elevated-210.  Still possible for mild early primary hyperparathyroidism.  She will not need acute intervention right now.  She will have repeat PTH /calcium during her subsequent labs.     6) Chronic Care/Health Maintenance:  -she  is on Statin medications and  is encouraged to initiate and continue to follow up with Ophthalmology, Dentist,  Podiatrist at least yearly or according to recommendations, and advised to   stay away from smoking. I have recommended yearly flu vaccine and pneumonia vaccine at least every 5 years; moderate intensity exercise for up to 150 minutes weekly; and  sleep for at least 7 hours a day.  - I advised patient to maintain close follow up with Renee Rival, NP for primary care needs.  I spent 35 minutes in the care of the patient today including review of labs from [facility], Lipids, Thyroid Function, Hematology (current and previous including abstractions from other facilities); face-to-face time discussing  her blood glucose readings/logs, discussing hypoglycemia and hyperglycemia episodes and symptoms, medications doses, her options of short and long term treatment based on the latest standards of care / guidelines;  discussion about incorporating  lifestyle medicine;  and documenting the encounter.    Please refer to Patient Instructions for Blood Glucose Monitoring and Insulin/Medications Dosing Guide"  in media tab for additional information. Please  also  refer to " Patient Self Inventory" in the Media  tab for reviewed elements of pertinent patient history.  Lindsey Vazquez participated in the discussions, expressed understanding, and voiced agreement with the above plans.  All questions were answered to her satisfaction. she is encouraged to contact clinic should she have any questions or concerns prior to her return visit.    Follow up plan: - Return in about 10 weeks (around 12/08/2021) for Bring Meter and Logs- A1c in Office.  Glade Lloyd, MD [facility] [address] [place], [place] [address] Phone: [phone]  Fax: [phone]    09/29/2021, 6:17 PM  This note was partially dictated with voice recognition software. Similar sounding words can be transcribed inadequately or may not  be corrected upon review.

## 2021-09-29 NOTE — Patient Instructions (Signed)

## 2021-10-27 ENCOUNTER — Inpatient Hospital Stay (HOSPITAL_COMMUNITY)
Admission: EM | Admit: 2021-10-27 | Discharge: 2021-10-29 | DRG: 638 | Disposition: A | Discharge status: Home / Self Care | Payer: 59 | Attending: Internal Medicine | Admitting: Internal Medicine

## 2021-10-27 ENCOUNTER — Encounter (HOSPITAL_COMMUNITY): Payer: Self-pay

## 2021-10-27 ENCOUNTER — Other Ambulatory Visit: Payer: Self-pay

## 2021-10-27 DIAGNOSIS — K3184 Gastroparesis: Secondary | ICD-10-CM | POA: Diagnosis present

## 2021-10-27 DIAGNOSIS — E1165 Type 2 diabetes mellitus with hyperglycemia: Secondary | ICD-10-CM | POA: Diagnosis present

## 2021-10-27 DIAGNOSIS — D72829 Elevated white blood cell count, unspecified: Secondary | ICD-10-CM | POA: Diagnosis present

## 2021-10-27 DIAGNOSIS — Z87891 Personal history of nicotine dependence: Secondary | ICD-10-CM

## 2021-10-27 DIAGNOSIS — K7581 Nonalcoholic steatohepatitis (NASH): Secondary | ICD-10-CM | POA: Diagnosis present

## 2021-10-27 DIAGNOSIS — R112 Nausea with vomiting, unspecified: Secondary | ICD-10-CM | POA: Diagnosis present

## 2021-10-27 DIAGNOSIS — Z9884 Bariatric surgery status: Secondary | ICD-10-CM

## 2021-10-27 DIAGNOSIS — I10 Essential (primary) hypertension: Secondary | ICD-10-CM | POA: Diagnosis present

## 2021-10-27 DIAGNOSIS — E860 Dehydration: Secondary | ICD-10-CM

## 2021-10-27 DIAGNOSIS — Z6841 Body Mass Index (BMI) 40.0 and over, adult: Secondary | ICD-10-CM

## 2021-10-27 DIAGNOSIS — E1110 Type 2 diabetes mellitus with ketoacidosis without coma: Principal | ICD-10-CM | POA: Diagnosis present

## 2021-10-27 DIAGNOSIS — Z79899 Other long term (current) drug therapy: Secondary | ICD-10-CM

## 2021-10-27 DIAGNOSIS — E782 Mixed hyperlipidemia: Secondary | ICD-10-CM | POA: Diagnosis present

## 2021-10-27 DIAGNOSIS — Z7984 Long term (current) use of oral hypoglycemic drugs: Secondary | ICD-10-CM

## 2021-10-27 DIAGNOSIS — K219 Gastro-esophageal reflux disease without esophagitis: Secondary | ICD-10-CM | POA: Diagnosis present

## 2021-10-27 DIAGNOSIS — R197 Diarrhea, unspecified: Secondary | ICD-10-CM | POA: Diagnosis present

## 2021-10-27 DIAGNOSIS — E0810 Diabetes mellitus due to underlying condition with ketoacidosis without coma: Secondary | ICD-10-CM

## 2021-10-27 DIAGNOSIS — Z7901 Long term (current) use of anticoagulants: Secondary | ICD-10-CM

## 2021-10-27 DIAGNOSIS — E1143 Type 2 diabetes mellitus with diabetic autonomic (poly)neuropathy: Secondary | ICD-10-CM | POA: Diagnosis present

## 2021-10-27 DIAGNOSIS — I480 Paroxysmal atrial fibrillation: Secondary | ICD-10-CM | POA: Diagnosis present

## 2021-10-27 DIAGNOSIS — R739 Hyperglycemia, unspecified: Secondary | ICD-10-CM

## 2021-10-27 DIAGNOSIS — G4733 Obstructive sleep apnea (adult) (pediatric): Secondary | ICD-10-CM | POA: Diagnosis present

## 2021-10-27 DIAGNOSIS — Z794 Long term (current) use of insulin: Secondary | ICD-10-CM

## 2021-10-27 HISTORY — DX: Essential (primary) hypertension: I10

## 2021-10-27 LAB — COMPREHENSIVE METABOLIC PANEL
ALT: 17 U/L (ref 0–44)
AST: 21 U/L (ref 15–41)
Albumin: 4 g/dL (ref 3.5–5.0)
Alkaline Phosphatase: 94 U/L (ref 38–126)
Anion gap: 19 — ABNORMAL HIGH (ref 5–15)
BUN: 16 mg/dL (ref 6–20)
CO2: 20 mmol/L — ABNORMAL LOW (ref 22–32)
Calcium: 9.8 mg/dL (ref 8.9–10.3)
Chloride: 95 mmol/L — ABNORMAL LOW (ref 98–111)
Creatinine, Ser: 1.33 mg/dL — ABNORMAL HIGH (ref 0.44–1.00)
GFR, Estimated: 48 mL/min — ABNORMAL LOW (ref >60)
Glucose, Bld: 285 mg/dL — ABNORMAL HIGH (ref 70–99)
Potassium: 3.9 mmol/L (ref 3.5–5.1)
Sodium: 134 mmol/L — ABNORMAL LOW (ref 135–145)
Total Bilirubin: 1.8 mg/dL — ABNORMAL HIGH (ref 0.3–1.2)
Total Protein: 8.1 g/dL (ref 6.5–8.1)

## 2021-10-27 LAB — CBG MONITORING, ED
Glucose-Capillary: 212 mg/dL — ABNORMAL HIGH (ref 70–99)
Glucose-Capillary: 226 mg/dL — ABNORMAL HIGH (ref 70–99)
Glucose-Capillary: 296 mg/dL — ABNORMAL HIGH (ref 70–99)

## 2021-10-27 LAB — BETA-HYDROXYBUTYRIC ACID: Beta-Hydroxybutyric Acid: 5.29 mmol/L — ABNORMAL HIGH (ref 0.05–0.27)

## 2021-10-27 LAB — LIPASE, BLOOD: Lipase: 27 U/L (ref 11–51)

## 2021-10-27 MED ORDER — ACETAMINOPHEN 650 MG RE SUPP: 650.0000 mg | Freq: Four times a day (QID) | RECTAL | Status: DC | PRN

## 2021-10-27 MED ORDER — ENOXAPARIN SODIUM 40 MG/0.4ML IJ SOSY: 40.0000 mg | PREFILLED_SYRINGE | INTRAMUSCULAR | Status: DC

## 2021-10-27 MED ORDER — SODIUM CHLORIDE 0.9 % IV BOLUS
1000.0000 mL | Freq: Once | INTRAVENOUS | Status: AC
  Administered 2021-10-27: 1000 mL via INTRAVENOUS

## 2021-10-27 MED ORDER — PANTOPRAZOLE SODIUM 40 MG IV SOLR
40.0000 mg | Freq: Once | INTRAVENOUS | Status: AC
  Administered 2021-10-27: 40 mg via INTRAVENOUS
  Filled 2021-10-27: qty 10

## 2021-10-27 MED ORDER — LACTATED RINGERS IV SOLN: INTRAVENOUS | Status: DC

## 2021-10-27 MED ORDER — METOCLOPRAMIDE HCL 5 MG/ML IJ SOLN
10.0000 mg | Freq: Once | INTRAMUSCULAR | Status: AC
  Administered 2021-10-27: 10 mg via INTRAVENOUS
  Filled 2021-10-27: qty 2

## 2021-10-27 MED ORDER — ONDANSETRON HCL 4 MG/2ML IJ SOLN
4.0000 mg | Freq: Once | INTRAMUSCULAR | Status: AC
  Administered 2021-10-27: 4 mg via INTRAVENOUS
  Filled 2021-10-27: qty 2

## 2021-10-27 MED ORDER — ACETAMINOPHEN 325 MG PO TABS
650.0000 mg | ORAL_TABLET | Freq: Four times a day (QID) | ORAL | Status: DC | PRN
  Administered 2021-10-29: 650 mg via ORAL
  Filled 2021-10-27: qty 2

## 2021-10-27 MED ORDER — INSULIN REGULAR(HUMAN) IN NACL 100-0.9 UT/100ML-% IV SOLN
INTRAVENOUS | Status: DC
  Administered 2021-10-27: 7.5 [IU]/h via INTRAVENOUS
  Administered 2021-10-28: 5.5 [IU]/h via INTRAVENOUS
  Filled 2021-10-27: qty 100

## 2021-10-27 MED ORDER — DEXTROSE 50 % IV SOLN: 0.0000 mL | INTRAVENOUS | Status: DC | PRN

## 2021-10-27 MED ORDER — POTASSIUM CHLORIDE 10 MEQ/100ML IV SOLN
10.0000 meq | INTRAVENOUS | Status: AC
  Administered 2021-10-27 – 2021-10-28 (×2): 10 meq via INTRAVENOUS
  Filled 2021-10-27 (×2): qty 100

## 2021-10-27 MED ORDER — DEXTROSE IN LACTATED RINGERS 5 % IV SOLN: INTRAVENOUS | Status: DC

## 2021-10-27 NOTE — H&P (Addendum)
?History and Physical  ? ? ?Patient: Lindsey Vazquez OIN:[id number] DOB: 04-Jan-1971 ?DOA: 10/27/2021 ?DOS: the patient was seen and examined on 10/28/2021 ?PCP: Renee Rival, NP  ?Patient coming from: Home ? ?Chief Complaint:  ?Chief Complaint  ?Patient presents with  ? Emesis  ?  ?DKA  ? ?HPI: Lindsey Vazquez is a 51 y.o. female with medical history significant for T2DM, hypertension, hyperlipidemia, paroxysmal atrial fibrillation, NASH, GERD and obesity who presented to the emergency department due to nausea, vomiting and diarrhea which started yesterday.  Patient had gastric sleeve done at [facility] in January of this year and since then, she has had several hospital admissions due to recurrent nausea and vomiting.  She followed up with her surgeons and after evaluation and imaging, there was no surgical complication and it was deemed that her symptoms were related to underlying medical illness. ?She denies chest pain, shortness of breath, fever, chills, painful urination, back or flank pain ?Recent hospitalization: ?2/18 - 2/20-[facility]-DKA with a flare of gastroparesis and intractable nausea and vomiting ?3/23 - 3/27-[facility] - Hyperglycemia, AKI ? ?ED Course:  ?In the emergency department, she was tachycardic, BP 164/78, other vital signs were within normal range.  Work-up in the ED showed normal CBC except for leukocytosis, BUN/creatinine 16/1.33 (baseline creatinine at 0.4-0.8), anion gap 19, CBG 284, beta hydroxybutyric acid 5.29. ?Patient was started on IV insulin drip per Endo tool.  Hospitalist was asked to admit patient for further evaluation and management. ? ?Review of Systems: ?Review of systems as noted in the HPI. All other systems reviewed and are negative. ? ? ?Past Medical History:  ?Diagnosis Date  ? Diabetes mellitus without complication ([place])   ? Hyperlipemia   ? Hypertension   ? ?Past Surgical History:  ?Procedure Laterality Date  ? c-section    ? CARDIAC CATHETERIZATION  07/2021  ?  LAPAROSCOPIC GASTRIC SLEEVE RESECTION  08/11/2021  ? ? ?Social History:  reports that she quit smoking about 6 years ago. Her smoking use included cigarettes. She has never used smokeless tobacco. She reports that she does not drink alcohol and does not use drugs. ? ? ?No Known Allergies ? ?History reviewed. No pertinent family history.  ? ?Prior to Admission medications   ?Medication Sig Start Date End Date Taking? Authorizing Provider  ?acetaminophen (TYLENOL) 500 MG tablet Take 1 tablet (500 mg total) by mouth every 6 (six) hours as needed for mild pain, fever or headache. 09/12/21   Barton Dubois, MD  ?atorvastatin (LIPITOR) 20 MG tablet Take 4 tablets (80 mg total) by mouth daily. 09/12/21   Barton Dubois, MD  ?Continuous Blood Gluc Receiver (FREESTYLE LIBRE 2 READER) DEVI As directed 09/01/21   Cassandria Anger, MD  ?Continuous Blood Gluc Sensor (FREESTYLE LIBRE 2 SENSOR) MISC 1 Piece by Does not apply route every 14 (fourteen) days. 09/01/21   Cassandria Anger, MD  ?docusate sodium (COLACE) 100 MG capsule Take 1 capsule (100 mg total) by mouth 2 (two) times daily as needed for mild constipation. 09/12/21   Barton Dubois, MD  ?DULoxetine (CYMBALTA) 60 MG capsule Take 60 mg by mouth daily. 07/22/21   [provider]  ?ELIQUIS 5 MG TABS tablet Take 5 mg by mouth 2 (two) times daily. 08/15/21   [provider]  ?GNP VITAMIN B-1 100 MG tablet Take 100 mg by mouth daily. 08/31/21   [provider]  ?hydrOXYzine (VISTARIL) 50 MG capsule Take 1 capsule (50 mg total) by mouth daily as needed  for anxiety or nausea. 09/12/21   Barton Dubois, MD  ?insulin glargine, 2 Unit Dial, (TOUJEO MAX SOLOSTAR) 300 UNIT/ML Solostar Pen Inject 34 Units into the skin daily. 09/29/21   Cassandria Anger, MD  ?insulin lispro (HUMALOG) 100 UNIT/ML injection  08/31/21   [provider]  ?magnesium oxide (MAG-OX) 400 (240 Mg) MG tablet Take 1 tablet by mouth daily. 06/27/21   [provider]   ?MAGNESIUM OXIDE, ANTACID, PO Take 1 tablet by mouth daily in the afternoon.    [provider]  ?metFORMIN (GLUCOPHAGE) 500 MG tablet Take 1 tablet (500 mg total) by mouth 2 (two) times daily with a meal. 09/12/21   Barton Dubois, MD  ?metoCLOPramide (REGLAN) 5 MG tablet Take 1 tablet (5 mg total) by mouth 3 (three) times daily. 09/12/21 09/12/22  Barton Dubois, MD  ?metoprolol succinate (TOPROL-XL) 100 MG 24 hr tablet Take 50 mg by mouth daily. 08/31/21   [provider]  ?Multiple Vitamin (MULTIVITAMIN) tablet Take 1 tablet by mouth daily.    [provider]  ?pantoprazole (PROTONIX) 40 MG tablet Take 1 tablet (40 mg total) by mouth 2 (two) times daily. 09/12/21 09/12/22  Barton Dubois, MD  ? ? ?Physical Exam: ?BP (!) 199/84   Pulse 95   Temp 98.9 ?F (37.2 ?C) (Oral)   Resp (!) 9   Ht '5\' 1"'$  (1.549 m)   Wt 103.8 kg   SpO2 97%   BMI 43.24 kg/m?  ? ?General: 51 y.o. year-old female well developed well nourished in no acute distress.  Alert and oriented x3. ?HEENT: NCAT, EOMI ?Neck: Supple, trachea medial ?Cardiovascular: Tachycardia.  Regular rate and rhythm with no rubs or gallops.  No thyromegaly or JVD noted.  No lower extremity edema. 2/4 pulses in all 4 extremities. ?Respiratory: Clear to auscultation with no wheezes or rales. Good inspiratory effort. ?Abdomen: Soft, nontender nondistended with normal bowel sounds x4 quadrants. ?Muskuloskeletal: No cyanosis, clubbing or edema noted bilaterally ?Neuro: CN II-XII intact, strength 5/5 x 4, sensation, reflexes intact ?Skin: No ulcerative lesions noted or rashes ?Psychiatry: Judgement and insight appear normal. Mood is appropriate for condition and setting ?   ?   ?   ?Labs on Admission:  ?Basic Metabolic Panel: ?Recent Labs  ?Lab 10/27/21 ?2041 10/28/21 ?0019  ?NA 134* 137  ?K 3.9 4.1  ?CL 95* 106  ?CO2 20* 20*  ?GLUCOSE 285* 187*  ?BUN 16 13  ?CREATININE 1.33* 1.02*  ?CALCIUM 9.8 7.9*  ? ?Liver Function Tests: ?Recent Labs  ?Lab  10/27/21 ?2041  ?AST 21  ?ALT 17  ?ALKPHOS 94  ?BILITOT 1.8*  ?PROT 8.1  ?ALBUMIN 4.0  ? ?Recent Labs  ?Lab 10/27/21 ?2041  ?LIPASE 27  ? ?No results for input(s): AMMONIA in the last 168 hours. ?CBC: ?Recent Labs  ?Lab 10/27/21 ?2041  ?WBC 16.4*  ?HGB 14.1  ?HCT 44.0  ?MCV 88.2  ?PLT 198  ? ?Cardiac Enzymes: ?No results for input(s): CKTOTAL, CKMB, CKMBINDEX, TROPONINI in the last 168 hours. ? ?BNP (last 3 results) ?No results for input(s): BNP in the last 8760 hours. ? ?ProBNP (last 3 results) ?No results for input(s): PROBNP in the last 8760 hours. ? ?CBG: ?Recent Labs  ?Lab 10/27/21 ?2349 10/28/21 ?0104 10/28/21 ?0210 10/28/21 ?0310 10/28/21 ?9381  ?GLUCAP 212* 185* 173* 169* 169*  ? ? ?Radiological Exams on Admission: ?No results found. ? ?EKG: I independently viewed the EKG done and my findings are as followed: EKG was not done in the  ED ? ?Assessment/Plan ?Present on Admission: ? DKA (diabetic ketoacidosis) ([place]) ? Uncontrolled type 2 diabetes mellitus with hyperglycemia ([place]) ? OSA (obstructive sleep apnea) ? Mixed hyperlipidemia ? Leukocytosis ? Intractable nausea and vomiting ? Essential hypertension, benign ? Class 3 obesity (HCC) ? Paroxysmal atrial fibrillation (HCC) ? ?Principal Problem: ?  DKA (diabetic ketoacidosis) ([place]) ?Active Problems: ?  Leukocytosis ?  Uncontrolled type 2 diabetes mellitus with hyperglycemia ([place]) ?  Essential hypertension, benign ?  Mixed hyperlipidemia ?  OSA (obstructive sleep apnea) ?  Class 3 obesity (HCC) ?  Paroxysmal atrial fibrillation (HCC) ?  Intractable nausea and vomiting ?  Diarrhea ? ? ?High Anion gap metabolic acidosis secondary to DKA ?Hyperglycemia secondary to poorly controlled type 2 diabetes mellitus ?Beta-hydroxybutyrate acid at 5.29 ?Continue insulin drip, IV LR with IV potassium per DKA protocol ?Transition IV LR to D5 LR when serum glucose reaches '250mg'$ /dL ?Continue serial BMP and VBG ?Continue to monitor for anion gap closure prior to transitioning  patient to subcu insulin ?Continue NPO ? ?Intractable nausea and vomiting ?Continue Zofran/Compazine as needed ? ?Diarrhea ?Patient endorsed several episodes of loose watery stools, however, she has not had any diarrhea si

## 2021-10-27 NOTE — ED Provider Notes (Signed)
?[facility] ?Provider Note ? ? ?CSN: [id number] ?Arrival date & time: 10/27/21  2005 ? ?  ? ?History ? ?Chief Complaint  ?Patient presents with  ? Emesis  ?  ?DKA  ? ? ?Lindsey Vazquez is a 51 y.o. female. ? ?Patient with hx iddm, c/o nausea, vomiting, diarrhea, and feeling as if in dka. Had gastric sleeve procedure at [facility] 07/2021, and since then several hospitalization with recurrent nv. Has been back to her surgeons, and indicates on evaluation and imaging, no surgical complication and feeling that her recurrent nv is due to her underlying medical illness. No abd distension. No bloody or bilious emesis. Having loose bms, but no severe diarrhea. No bloody diarrhea. No fever or chills. No back or flank pain. No chest pain or sob. No dysuria. No fever or chills.  ? ?The history is provided by the patient and medical records.  ?Emesis ?Associated symptoms: no abdominal pain, no chills, no cough, no fever, no headaches and no sore throat   ? ?  ? ?Home Medications ?Prior to Admission medications   ?Medication Sig Start Date End Date Taking? Authorizing Provider  ?acetaminophen (TYLENOL) 500 MG tablet Take 1 tablet (500 mg total) by mouth every 6 (six) hours as needed for mild pain, fever or headache. 09/12/21   Barton Dubois, MD  ?atorvastatin (LIPITOR) 20 MG tablet Take 4 tablets (80 mg total) by mouth daily. 09/12/21   Barton Dubois, MD  ?Continuous Blood Gluc Receiver (FREESTYLE LIBRE 2 READER) DEVI As directed 09/01/21   Cassandria Anger, MD  ?Continuous Blood Gluc Sensor (FREESTYLE LIBRE 2 SENSOR) MISC 1 Piece by Does not apply route every 14 (fourteen) days. 09/01/21   Cassandria Anger, MD  ?docusate sodium (COLACE) 100 MG capsule Take 1 capsule (100 mg total) by mouth 2 (two) times daily as needed for mild constipation. 09/12/21   Barton Dubois, MD  ?DULoxetine (CYMBALTA) 60 MG capsule Take 60 mg by mouth daily. 07/22/21   [provider]  ?ELIQUIS 5 MG TABS tablet Take 5 mg  by mouth 2 (two) times daily. 08/15/21   [provider]  ?GNP VITAMIN B-1 100 MG tablet Take 100 mg by mouth daily. 08/31/21   [provider]  ?hydrOXYzine (VISTARIL) 50 MG capsule Take 1 capsule (50 mg total) by mouth daily as needed for anxiety or nausea. 09/12/21   Barton Dubois, MD  ?insulin glargine, 2 Unit Dial, (TOUJEO MAX SOLOSTAR) 300 UNIT/ML Solostar Pen Inject 34 Units into the skin daily. 09/29/21   Cassandria Anger, MD  ?insulin lispro (HUMALOG) 100 UNIT/ML injection  08/31/21   [provider]  ?magnesium oxide (MAG-OX) 400 (240 Mg) MG tablet Take 1 tablet by mouth daily. 06/27/21   [provider]  ?MAGNESIUM OXIDE, ANTACID, PO Take 1 tablet by mouth daily in the afternoon.    [provider]  ?metFORMIN (GLUCOPHAGE) 500 MG tablet Take 1 tablet (500 mg total) by mouth 2 (two) times daily with a meal. 09/12/21   Barton Dubois, MD  ?metoCLOPramide (REGLAN) 5 MG tablet Take 1 tablet (5 mg total) by mouth 3 (three) times daily. 09/12/21 09/12/22  Barton Dubois, MD  ?metoprolol succinate (TOPROL-XL) 100 MG 24 hr tablet Take 50 mg by mouth daily. 08/31/21   [provider]  ?Multiple Vitamin (MULTIVITAMIN) tablet Take 1 tablet by mouth daily.    [provider]  ?pantoprazole (PROTONIX) 40 MG tablet Take 1 tablet (40 mg total) by mouth 2 (two)  times daily. 09/12/21 09/12/22  Barton Dubois, MD  ?   ? ?Allergies    ?Patient has no known allergies.   ? ?Review of Systems   ?Review of Systems  ?Constitutional:  Negative for chills and fever.  ?HENT:  Negative for sore throat.   ?Eyes:  Negative for redness.  ?Respiratory:  Negative for cough and shortness of breath.   ?Cardiovascular:  Negative for chest pain.  ?Gastrointestinal:  Positive for nausea and vomiting. Negative for abdominal pain.  ?Genitourinary:  Negative for dysuria and flank pain.  ?Musculoskeletal:  Negative for back pain.  ?Skin:  Negative for rash.  ?Neurological:  Negative for  headaches.  ?Hematological:  Does not bruise/bleed easily.  ?Psychiatric/Behavioral:  Negative for confusion.   ? ?Physical Exam ?Updated Vital Signs ?BP (!) 152/89   Pulse (!) 104   Temp 98.3 ?F (36.8 ?C) (Oral)   Resp 10   Ht 1.549 m ('5\' 1"'$ )   Wt 100.7 kg   SpO2 98%   BMI 41.95 kg/m?  ?Physical Exam ?Vitals and nursing note reviewed.  ?Constitutional:   ?   Appearance: Normal appearance. She is well-developed.  ?HENT:  ?   Head: Atraumatic.  ?   Nose: Nose normal.  ?   Mouth/Throat:  ?   Mouth: Mucous membranes are moist.  ?Eyes:  ?   General: No scleral icterus. ?   Conjunctiva/sclera: Conjunctivae normal.  ?Neck:  ?   Trachea: No tracheal deviation.  ?Cardiovascular:  ?   Rate and Rhythm: Regular rhythm. Tachycardia present.  ?   Pulses: Normal pulses.  ?   Heart sounds: Normal heart sounds. No murmur heard. ?  No friction rub. No gallop.  ?Pulmonary:  ?   Effort: Pulmonary effort is normal. No respiratory distress.  ?   Breath sounds: Normal breath sounds.  ?Abdominal:  ?   General: Bowel sounds are normal. There is no distension.  ?   Palpations: Abdomen is soft. There is no mass.  ?   Tenderness: There is no abdominal tenderness. There is no guarding or rebound.  ?   Hernia: No hernia is present.  ?Genitourinary: ?   Comments: No cva tenderness.  ?Musculoskeletal:     ?   General: No swelling.  ?   Cervical back: Normal range of motion and neck supple. No rigidity. No muscular tenderness.  ?   Right lower leg: No edema.  ?   Left lower leg: No edema.  ?Skin: ?   General: Skin is warm and dry.  ?   Findings: No rash.  ?Neurological:  ?   Mental Status: She is alert.  ?   Comments: Alert, speech normal.   ?Psychiatric:     ?   Mood and Affect: Mood normal.  ? ? ?ED Results / Procedures / Treatments   ?Labs ?(all labs ordered are listed, but only abnormal results are displayed) ?Results for orders placed or performed during the hospital encounter of 10/27/21  ?Lipase, blood  ?Result Value Ref Range  ?  Lipase 27 11 - 51 U/L  ?Comprehensive metabolic panel  ?Result Value Ref Range  ? Sodium 134 (L) 135 - 145 mmol/L  ? Potassium 3.9 3.5 - 5.1 mmol/L  ? Chloride 95 (L) 98 - 111 mmol/L  ? CO2 20 (L) 22 - 32 mmol/L  ? Glucose, Bld 285 (H) 70 - 99 mg/dL  ? BUN 16 6 - 20 mg/dL  ? Creatinine, Ser 1.33 (H) 0.44 - 1.00 mg/dL  ?  Calcium 9.8 8.9 - 10.3 mg/dL  ? Total Protein 8.1 6.5 - 8.1 g/dL  ? Albumin 4.0 3.5 - 5.0 g/dL  ? AST 21 15 - 41 U/L  ? ALT 17 0 - 44 U/L  ? Alkaline Phosphatase 94 38 - 126 U/L  ? Total Bilirubin 1.8 (H) 0.3 - 1.2 mg/dL  ? GFR, Estimated 48 (L) >60 mL/min  ? Anion gap 19 (H) 5 - 15  ?CBC  ?Result Value Ref Range  ? WBC 16.4 (H) 4.0 - 10.5 K/uL  ? RBC 4.99 3.87 - 5.11 MIL/uL  ? Hemoglobin 14.1 12.0 - 15.0 g/dL  ? HCT 44.0 36.0 - 46.0 %  ? MCV 88.2 80.0 - 100.0 fL  ? MCH 28.3 26.0 - 34.0 pg  ? MCHC 32.0 30.0 - 36.0 g/dL  ? RDW 15.1 11.5 - 15.5 %  ? Platelets 198 150 - 400 K/uL  ? nRBC 0.0 0.0 - 0.2 %  ?CBG monitoring, ED  ?Result Value Ref Range  ? Glucose-Capillary 296 (H) 70 - 99 mg/dL  ? ? ? ?EKG ?None ? ?Radiology ?No results found. ? ?Procedures ?Procedures  ? ? ?Medications Ordered in ED ?Medications  ?sodium chloride 0.9 % bolus 1,000 mL (has no administration in time range)  ?metoCLOPramide (REGLAN) injection 10 mg (has no administration in time range)  ?pantoprazole (PROTONIX) injection 40 mg (has no administration in time range)  ?insulin regular, human (MYXREDLIN) 100 units/ 100 mL infusion (has no administration in time range)  ?lactated ringers infusion (has no administration in time range)  ?dextrose 5 % in lactated ringers infusion (has no administration in time range)  ?dextrose 50 % solution 0-50 mL (has no administration in time range)  ?potassium chloride 10 mEq in 100 mL IVPB (has no administration in time range)  ?sodium chloride 0.9 % bolus 1,000 mL (1,000 mLs Intravenous New Bag/Given 10/27/21 2101)  ?ondansetron [facility]) injection 4 mg (4 mg Intravenous Given 10/27/21 2100)   ? ? ?ED Course/ Medical Decision Making/ A&P ?  ?                        ?Medical Decision Making ?Problems Addressed: ?Dehydration: acute illness or injury with systemic symptoms that poses a threat to life or bodily func

## 2021-10-27 NOTE — ED Triage Notes (Signed)
Pt to ED with c/o nausea and vomiting that started yesterday. Pt says it feels like she may be in DKA. Reports sugar at home was 324 just before arriving. Pt reports compliance with oral diabetic medication.  ?

## 2021-10-28 ENCOUNTER — Encounter (HOSPITAL_COMMUNITY): Payer: Self-pay | Admitting: Internal Medicine

## 2021-10-28 DIAGNOSIS — E1110 Type 2 diabetes mellitus with ketoacidosis without coma: Secondary | ICD-10-CM | POA: Diagnosis not present

## 2021-10-28 DIAGNOSIS — Z6841 Body Mass Index (BMI) 40.0 and over, adult: Secondary | ICD-10-CM | POA: Diagnosis not present

## 2021-10-28 DIAGNOSIS — R112 Nausea with vomiting, unspecified: Secondary | ICD-10-CM | POA: Diagnosis not present

## 2021-10-28 DIAGNOSIS — K3184 Gastroparesis: Secondary | ICD-10-CM | POA: Diagnosis present

## 2021-10-28 DIAGNOSIS — R197 Diarrhea, unspecified: Secondary | ICD-10-CM | POA: Diagnosis not present

## 2021-10-28 DIAGNOSIS — Z794 Long term (current) use of insulin: Secondary | ICD-10-CM | POA: Diagnosis not present

## 2021-10-28 DIAGNOSIS — E782 Mixed hyperlipidemia: Secondary | ICD-10-CM | POA: Diagnosis present

## 2021-10-28 DIAGNOSIS — Z87891 Personal history of nicotine dependence: Secondary | ICD-10-CM | POA: Diagnosis not present

## 2021-10-28 DIAGNOSIS — E1165 Type 2 diabetes mellitus with hyperglycemia: Secondary | ICD-10-CM

## 2021-10-28 DIAGNOSIS — I10 Essential (primary) hypertension: Secondary | ICD-10-CM | POA: Diagnosis present

## 2021-10-28 DIAGNOSIS — E1143 Type 2 diabetes mellitus with diabetic autonomic (poly)neuropathy: Secondary | ICD-10-CM | POA: Diagnosis present

## 2021-10-28 DIAGNOSIS — K219 Gastro-esophageal reflux disease without esophagitis: Secondary | ICD-10-CM | POA: Diagnosis present

## 2021-10-28 DIAGNOSIS — G4733 Obstructive sleep apnea (adult) (pediatric): Secondary | ICD-10-CM | POA: Diagnosis present

## 2021-10-28 DIAGNOSIS — Z79899 Other long term (current) drug therapy: Secondary | ICD-10-CM | POA: Diagnosis not present

## 2021-10-28 DIAGNOSIS — Z9884 Bariatric surgery status: Secondary | ICD-10-CM | POA: Diagnosis not present

## 2021-10-28 DIAGNOSIS — D72829 Elevated white blood cell count, unspecified: Secondary | ICD-10-CM | POA: Diagnosis present

## 2021-10-28 DIAGNOSIS — K7581 Nonalcoholic steatohepatitis (NASH): Secondary | ICD-10-CM | POA: Diagnosis present

## 2021-10-28 DIAGNOSIS — Z7984 Long term (current) use of oral hypoglycemic drugs: Secondary | ICD-10-CM | POA: Diagnosis not present

## 2021-10-28 DIAGNOSIS — Z7901 Long term (current) use of anticoagulants: Secondary | ICD-10-CM | POA: Diagnosis not present

## 2021-10-28 DIAGNOSIS — I480 Paroxysmal atrial fibrillation: Secondary | ICD-10-CM | POA: Diagnosis present

## 2021-10-28 LAB — BASIC METABOLIC PANEL
Anion gap: 11 (ref 5–15)
Anion gap: 11 (ref 5–15)
Anion gap: 12 (ref 5–15)
Anion gap: 13 (ref 5–15)
BUN: 13 mg/dL (ref 6–20)
BUN: 13 mg/dL (ref 6–20)
BUN: 13 mg/dL (ref 6–20)
BUN: 14 mg/dL (ref 6–20)
CO2: 20 mmol/L — ABNORMAL LOW (ref 22–32)
CO2: 20 mmol/L — ABNORMAL LOW (ref 22–32)
CO2: 22 mmol/L (ref 22–32)
CO2: 23 mmol/L (ref 22–32)
Calcium: 7.9 mg/dL — ABNORMAL LOW (ref 8.9–10.3)
Calcium: 8.6 mg/dL — ABNORMAL LOW (ref 8.9–10.3)
Calcium: 8.9 mg/dL (ref 8.9–10.3)
Calcium: 9.2 mg/dL (ref 8.9–10.3)
Chloride: 101 mmol/L (ref 98–111)
Chloride: 102 mmol/L (ref 98–111)
Chloride: 103 mmol/L (ref 98–111)
Chloride: 106 mmol/L (ref 98–111)
Creatinine, Ser: 0.96 mg/dL (ref 0.44–1.00)
Creatinine, Ser: 0.99 mg/dL (ref 0.44–1.00)
Creatinine, Ser: 1.01 mg/dL — ABNORMAL HIGH (ref 0.44–1.00)
Creatinine, Ser: 1.02 mg/dL — ABNORMAL HIGH (ref 0.44–1.00)
GFR, Estimated: >60 mL/min (ref >60)
GFR, Estimated: >60 mL/min (ref >60)
GFR, Estimated: >60 mL/min (ref >60)
GFR, Estimated: >60 mL/min (ref >60)
Glucose, Bld: 170 mg/dL — ABNORMAL HIGH (ref 70–99)
Glucose, Bld: 173 mg/dL — ABNORMAL HIGH (ref 70–99)
Glucose, Bld: 187 mg/dL — ABNORMAL HIGH (ref 70–99)
Glucose, Bld: 246 mg/dL — ABNORMAL HIGH (ref 70–99)
Potassium: 3.4 mmol/L — ABNORMAL LOW (ref 3.5–5.1)
Potassium: 3.4 mmol/L — ABNORMAL LOW (ref 3.5–5.1)
Potassium: 3.7 mmol/L (ref 3.5–5.1)
Potassium: 4.1 mmol/L (ref 3.5–5.1)
Sodium: 134 mmol/L — ABNORMAL LOW (ref 135–145)
Sodium: 136 mmol/L (ref 135–145)
Sodium: 137 mmol/L (ref 135–145)
Sodium: 137 mmol/L (ref 135–145)

## 2021-10-28 LAB — GLUCOSE, CAPILLARY
Glucose-Capillary: 173 mg/dL — ABNORMAL HIGH (ref 70–99)
Glucose-Capillary: 169 mg/dL — ABNORMAL HIGH (ref 70–99)
Glucose-Capillary: 169 mg/dL — ABNORMAL HIGH (ref 70–99)
Glucose-Capillary: 191 mg/dL — ABNORMAL HIGH (ref 70–99)
Glucose-Capillary: 186 mg/dL — ABNORMAL HIGH (ref 70–99)
Glucose-Capillary: 172 mg/dL — ABNORMAL HIGH (ref 70–99)
Glucose-Capillary: 117 mg/dL — ABNORMAL HIGH (ref 70–99)
Glucose-Capillary: 280 mg/dL — ABNORMAL HIGH (ref 70–99)
Glucose-Capillary: 232 mg/dL — ABNORMAL HIGH (ref 70–99)

## 2021-10-28 LAB — CBC
HCT: 37.1 % (ref 36.0–46.0)
Hemoglobin: 12.1 g/dL (ref 12.0–15.0)
MCH: 28.3 pg (ref 26.0–34.0)
MCHC: 32.6 g/dL (ref 30.0–36.0)
MCV: 86.9 fL (ref 80.0–100.0)
Platelets: 170 10*3/uL (ref 150–400)
RBC: 4.27 MIL/uL (ref 3.87–5.11)
RDW: 15 % (ref 11.5–15.5)
WBC: 13.3 10*3/uL — ABNORMAL HIGH (ref 4.0–10.5)
nRBC: 0 % (ref 0.0–0.2)

## 2021-10-28 LAB — MAGNESIUM: Magnesium: 1.5 mg/dL — ABNORMAL LOW (ref 1.7–2.4)

## 2021-10-28 LAB — MRSA NEXT GEN BY PCR, NASAL: MRSA by PCR Next Gen: DETECTED — AB

## 2021-10-28 LAB — PHOSPHORUS: Phosphorus: 2.1 mg/dL — ABNORMAL LOW (ref 2.5–4.6)

## 2021-10-28 LAB — CBG MONITORING, ED: Glucose-Capillary: 185 mg/dL — ABNORMAL HIGH (ref 70–99)

## 2021-10-28 LAB — BETA-HYDROXYBUTYRIC ACID
Beta-Hydroxybutyric Acid: 2.18 mmol/L — ABNORMAL HIGH (ref 0.05–0.27)
Beta-Hydroxybutyric Acid: 3.72 mmol/L — ABNORMAL HIGH (ref 0.05–0.27)

## 2021-10-28 MED ORDER — HYDRALAZINE HCL 20 MG/ML IJ SOLN
10.0000 mg | Freq: Four times a day (QID) | INTRAMUSCULAR | Status: DC | PRN
Start: 2021-10-28 — End: 2021-10-29
  Administered 2021-10-28 – 2021-10-29 (×3): 10 mg via INTRAVENOUS
  Filled 2021-10-28 (×3): qty 1

## 2021-10-28 MED ORDER — ONDANSETRON HCL 4 MG/2ML IJ SOLN
4.0000 mg | Freq: Four times a day (QID) | INTRAMUSCULAR | Status: DC | PRN
  Administered 2021-10-28 – 2021-10-29 (×4): 4 mg via INTRAVENOUS
  Filled 2021-10-28 (×4): qty 2

## 2021-10-28 MED ORDER — LACTATED RINGERS IV SOLN: INTRAVENOUS | Status: AC

## 2021-10-28 MED ORDER — INSULIN ASPART 100 UNIT/ML IJ SOLN
0.0000 [IU] | Freq: Three times a day (TID) | INTRAMUSCULAR | Status: DC
  Administered 2021-10-28 – 2021-10-29 (×2): 11 [IU] via SUBCUTANEOUS
  Administered 2021-10-29: 7 [IU] via SUBCUTANEOUS

## 2021-10-28 MED ORDER — MUPIROCIN 2 % EX OINT
1.0000 "application " | TOPICAL_OINTMENT | Freq: Two times a day (BID) | CUTANEOUS | Status: DC
  Administered 2021-10-28 – 2021-10-29 (×2): 1 via NASAL
  Filled 2021-10-28 (×2): qty 22

## 2021-10-28 MED ORDER — PROMETHAZINE HCL 12.5 MG PO TABS
25.0000 mg | ORAL_TABLET | Freq: Four times a day (QID) | ORAL | Status: DC | PRN
  Administered 2021-10-28 – 2021-10-29 (×4): 25 mg via ORAL
  Filled 2021-10-28 (×4): qty 2

## 2021-10-28 MED ORDER — INSULIN GLARGINE-YFGN 100 UNIT/ML ~~LOC~~ SOLN
30.0000 [IU] | Freq: Once | SUBCUTANEOUS | Status: AC
  Administered 2021-10-28: 30 [IU] via SUBCUTANEOUS
  Filled 2021-10-28: qty 0.3

## 2021-10-28 MED ORDER — METOCLOPRAMIDE HCL 5 MG/ML IJ SOLN
10.0000 mg | Freq: Once | INTRAMUSCULAR | Status: AC
  Administered 2021-10-28: 10 mg via INTRAVENOUS
  Filled 2021-10-28: qty 2

## 2021-10-28 MED ORDER — INSULIN ASPART 100 UNIT/ML IJ SOLN
0.0000 [IU] | Freq: Every day | INTRAMUSCULAR | Status: DC
  Administered 2021-10-28: 2 [IU] via SUBCUTANEOUS

## 2021-10-28 MED ORDER — PROCHLORPERAZINE MALEATE 5 MG PO TABS
10.0000 mg | ORAL_TABLET | Freq: Four times a day (QID) | ORAL | Status: DC | PRN
Start: 2021-10-28 — End: 2021-10-28
  Administered 2021-10-28: 10 mg via ORAL
  Filled 2021-10-28: qty 2

## 2021-10-28 MED ORDER — PANTOPRAZOLE SODIUM 40 MG IV SOLR
40.0000 mg | INTRAVENOUS | Status: DC
Start: 2021-10-28 — End: 2021-10-28
  Administered 2021-10-28: 40 mg via INTRAVENOUS
  Filled 2021-10-28: qty 10

## 2021-10-28 MED ORDER — PANTOPRAZOLE SODIUM 40 MG IV SOLR
40.0000 mg | Freq: Two times a day (BID) | INTRAVENOUS | Status: DC
  Administered 2021-10-28 – 2021-10-29 (×2): 40 mg via INTRAVENOUS
  Filled 2021-10-28 (×2): qty 10

## 2021-10-28 MED ORDER — METOPROLOL TARTRATE 5 MG/5ML IV SOLN
2.5000 mg | Freq: Four times a day (QID) | INTRAVENOUS | Status: DC | PRN
  Administered 2021-10-28 – 2021-10-29 (×2): 2.5 mg via INTRAVENOUS
  Filled 2021-10-28 (×2): qty 5

## 2021-10-28 MED ORDER — CHLORHEXIDINE GLUCONATE CLOTH 2 % EX PADS
6.0000 | MEDICATED_PAD | Freq: Every day | CUTANEOUS | Status: DC
  Administered 2021-10-28 – 2021-10-29 (×2): 6 via TOPICAL

## 2021-10-28 NOTE — Progress Notes (Signed)
?PROGRESS NOTE ? ? ? ?Lindsey Vazquez  BMW:[id number] DOB: 11/16/1970 DOA: 10/27/2021 ?PCP: Renee Rival, NP ? ? ?Brief Narrative:  ?Lindsey Vazquez is a 51 y.o. female with medical history significant for T2DM, hypertension, hyperlipidemia, paroxysmal atrial fibrillation, NASH, GERD and obesity who presented to the emergency department due to nausea, vomiting and diarrhea which started just prior to admission.  Patient had gastric sleeve done at [facility] in January of this year and since then, she has had several hospital admissions due to recurrent nausea and vomiting.  She followed up with her surgeons and after evaluation and imaging, there was no surgical complication and it was deemed that her symptoms were related to underlying medical illness -despite her having well-controlled glucose in between episodes of DKA with ongoing intractable nausea vomiting that began after gastric sleeve surgery. ? ?*Patient was admitted to our facility late February for DKA, repeat admission mid-March at [facility] for similar episode. ? ?Given patient's anion gap metabolic acidosis intractable nausea vomiting and DKA she was admitted for IV fluids and correction with IV insulin. ? ?Assessment & Plan: ?  ?Principal Problem: ?  DKA (diabetic ketoacidosis) ([place]) ?Active Problems: ?  Leukocytosis ?  Uncontrolled type 2 diabetes mellitus with hyperglycemia ([place]) ?  Essential hypertension, benign ?  Mixed hyperlipidemia ?  OSA (obstructive sleep apnea) ?  Class 3 obesity ([facility]) ?  Paroxysmal atrial fibrillation ([facility]) ?  Intractable nausea and vomiting ?  Diarrhea ? ?Acute intractable nausea vomiting; multifactorial ?High Anion gap metabolic acidosis secondary to DKA ?Hyperglycemia secondary to poorly controlled type 2 diabetes mellitus ?- Anion gap closed, transition to p.o. intake and long-acting insulin, ACHS sliding scale and hypoglycemic protocol ?-Initial onset of symptoms began post gastric sleeve creation, unclear how much of  her symptoms are secondary to previous surgery versus ongoing metabolic derangements -DKA resolved overnight/early this morning and patient continues to have profound nausea vomiting ?-Continue Zofran/Phenergan as needed ?-Advance diet as tolerated slowly ?  ?Diarrhea, resolved ?-Noted prior to admission, no episodes since intake, C. difficile negative ?  ?Chronic leukocytosis ?WBC 16.4, continue to monitor WBC ?  ?Essential hypertension ?Continue IV hydralazine 10 mg every 6 hours as needed for SBP > 170 ?Resume home meds ? ?Mixed hyperlipidemia ?Consider starting statin when patient starts to tolerate oral intake (currently n.p.o.) ?  ?Paroxysmal atrial fibrillation ?Continue Eliquis ?Continue IV metoprolol 2.5 mg every 6 hours as needed for HR > 130 ?Start transitioning to patient's Toprol-XL when able to tolerate oral intake ? ?GERD ?Continue IV Protonix ?  ?Obstructive sleep apnea ?Stable, patient states that she does not use CPAP ? ?Obesity (BMI 43.24 kg/m?) ?Patient recently had gastric sleeve ?Continue diet and lifestyle modification ? ? ?DVT prophylaxis: SCDs, early ambulation ?Code Status: Full ?Family Communication: None present ? ?Status is: Observation ? ?Dispo: The patient is from: Home ?             Anticipated d/c is to: Home ?             Anticipated d/c date is: 24 to 48 hours ?             Patient currently not medically stable for discharge ? ?Consultants:  ?None ? ?Procedures:  ?None ? ?Antimicrobials:  ?None ? ?Subjective: ?P.o. intake minimally improving, still having profound nausea ? ?Objective: ?Vitals:  ? 10/28/21 0500 10/28/21 0600 10/28/21 0700 10/28/21 1100  ?BP: (!) 162/66 (!) 146/62 119/71   ?Pulse: (!) 103 (!) 119 (!) 115   ?  Resp: 12 17 (!) 22   ?Temp:    99 ?F (37.2 ?C)  ?TempSrc:    Oral  ?SpO2: 95% 95% 95%   ?Weight:      ?Height:      ? ? ?Intake/Output Summary (Last 24 hours) at 10/28/2021 1427 ?Last data filed at 10/28/2021 0400 ?Gross per 24 hour  ?Intake 224.81 ml  ?Output --   ?Net 224.81 ml  ? ?Filed Weights  ? 10/27/21 2011 10/28/21 0133  ?Weight: 100.7 kg 103.8 kg  ? ? ?Examination: ? ?General:  Pleasantly resting in bed, No acute distress. ?HEENT:  Normocephalic atraumatic.  Sclerae nonicteric, noninjected.  Extraocular movements intact bilaterally. ?Neck:  Without mass or deformity.  Trachea is midline. ?Lungs:  Clear to auscultate bilaterally without rhonchi, wheeze, or rales. ?Heart:  Regular rate and rhythm.  Without murmurs, rubs, or gallops. ?Abdomen:  Soft, nontender, nondistended.  Without guarding or rebound. ?Extremities: Without cyanosis, clubbing, edema, or obvious deformity. ?Vascular:  Dorsalis pedis and posterior tibial pulses palpable bilaterally. ?Skin:  Warm and dry, no erythema, no ulcerations. ? ?Data Reviewed: I have personally reviewed following labs and imaging studies ? ?CBC: ?Recent Labs  ?Lab 10/27/21 ?2041 10/28/21 ?0415  ?WBC 16.4* 13.3*  ?HGB 14.1 12.1  ?HCT 44.0 37.1  ?MCV 88.2 86.9  ?PLT 198 170  ? ?Basic Metabolic Panel: ?Recent Labs  ?Lab 10/27/21 ?2041 10/28/21 ?8938 10/28/21 ?1017 10/28/21 ?5102  ?NA 134* 137 136 137  ?K 3.9 4.1 3.4* 3.4*  ?CL 95* 106 103 102  ?CO2 20* 20* 22 23  ?GLUCOSE 285* 187* 170* 173*  ?BUN '16 13 14 13  '$ ?CREATININE 1.33* 1.02* 0.99 0.96  ?CALCIUM 9.8 7.9* 8.6* 9.2  ?MG  --   --  1.5*  --   ?PHOS  --   --  2.1*  --   ? ?GFR: ?Estimated Creatinine Clearance: 76 mL/min (by C-G formula based on SCr of 0.96 mg/dL). ?Liver Function Tests: ?Recent Labs  ?Lab 10/27/21 ?2041  ?AST 21  ?ALT 17  ?ALKPHOS 94  ?BILITOT 1.8*  ?PROT 8.1  ?ALBUMIN 4.0  ? ?Recent Labs  ?Lab 10/27/21 ?2041  ?LIPASE 27  ? ?No results for input(s): AMMONIA in the last 168 hours. ?Coagulation Profile: ?No results for input(s): INR, PROTIME in the last 168 hours. ?Cardiac Enzymes: ?No results for input(s): CKTOTAL, CKMB, CKMBINDEX, TROPONINI in the last 168 hours. ?BNP (last 3 results) ?No results for input(s): PROBNP in the last 8760 hours. ?HbA1C: ?No results  for input(s): HGBA1C in the last 72 hours. ?CBG: ?Recent Labs  ?Lab 10/28/21 ?0414 10/28/21 ?5852 10/28/21 ?7782 10/28/21 ?4235 10/28/21 ?1113  ?GLUCAP 169* 191* 186* 172* 117*  ? ?Lipid Profile: ?No results for input(s): CHOL, HDL, LDLCALC, TRIG, CHOLHDL, LDLDIRECT in the last 72 hours. ?Thyroid Function Tests: ?No results for input(s): TSH, T4TOTAL, FREET4, T3FREE, THYROIDAB in the last 72 hours. ?Anemia Panel: ?No results for input(s): VITAMINB12, FOLATE, FERRITIN, TIBC, IRON, RETICCTPCT in the last 72 hours. ?Sepsis Labs: ?No results for input(s): PROCALCITON, LATICACIDVEN in the last 168 hours. ? ?Recent Results (from the past 240 hour(s))  ?MRSA Next Gen by PCR, Nasal     Status: Abnormal  ? Collection Time: 10/28/21  1:19 AM  ? Specimen: Nasal Mucosa; Nasal Swab  ?Result Value Ref Range Status  ? MRSA by PCR Next Gen DETECTED (A) NOT DETECTED Final  ?  Comment: RESULT CALLED TO, READ BACK BY AND VERIFIED WITH: ?KING @ 0917 ON [id number] BY HENDERSON L ?(  NOTE) ?The GeneXpert MRSA Assay (FDA approved for NASAL specimens only), ?is one component of a comprehensive MRSA colonization surveillance ?program. It is not intended to diagnose MRSA infection nor to guide ?or monitor treatment for MRSA infections. ?Test performance is not FDA approved in patients less than 2 years ?old. ?Performed at [facility], [address], [place], [place] [address] ?  ? ?Radiology Studies: ?No results found. ? ?Scheduled Meds: ? Chlorhexidine Gluconate Cloth  6 each Topical Daily  ? mupirocin ointment  1 application. Nasal BID  ? pantoprazole (PROTONIX) IV  40 mg Intravenous Q12H  ? ?Continuous Infusions: ? dextrose 5% lactated ringers 125 mL/hr at 10/27/21 2251  ? insulin Stopped (10/28/21 1115)  ? lactated ringers 125 mL/hr at 10/28/21 6387  ? ? ? LOS: 0 days  ? ?Time spent: 60mn ? ?WLittle Ishikawa DO ?[facility] ? ?If 7PM-7AM, please contact night-coverage ?[email or web address] ? ?10/28/2021, 2:27 PM   ? ? ? ?

## 2021-10-28 NOTE — Progress Notes (Signed)
Inpatient Diabetes Program Recommendations ? ?AACE/ADA: New Consensus Statement on Inpatient Glycemic Control (2015) ? ?Target Ranges:  Prepandial:   less than 140 mg/dL ?     Peak postprandial:   less than 180 mg/dL (1-2 hours) ?     Critically ill patients:  140 - 180 mg/dL  ? ?Lab Results  ?Component Value Date  ? GLUCAP 172 (H) 10/28/2021  ? HGBA1C 8.7 (H) 09/11/2021  ? ? ?Review of Glycemic Control ? ?DKA ?Diabetes history: DM 2 ?Outpatient Diabetes medications: Toujeo 34 units Daily, Metformin 500 mg bid ?Current orders for Inpatient glycemic control:  ?IV insulin/Endotool Transitioning ? ?Inpatient Diabetes Program Recommendations:   ? ?Note pt received one time dose of Semglee 30 units this am for transition off IV insulin gtt. ? ?-  Consider Novolog 0-15 units tid + hs scale ?-  Add basal insulin at 30-34 units (home dose) ? ? ?Thanks, ? ?Tama Headings RN, MSN, BC-ADM ?Inpatient Diabetes Coordinator ?Team Pager [phone] (8a-5p) ? ? ? ?

## 2021-10-28 NOTE — Progress Notes (Signed)
Patient able to tolerate approx. 50% of her dinner. States that nausea is better and has not had any episodes of emesis since last anti-emetic given.  Patient is resting comfortable at this time.   ?

## 2021-10-28 NOTE — Discharge Summary (Signed)
Physician Discharge Summary  ?Lindsey Vazquez FXJ:[id number] DOB: 04-11-1971 DOA: 10/27/2021 ? ?PCP: Renee Rival, NP ? ?Admit date: 10/27/2021 ?Discharge date: 10/29/2021 ? ?Admitted From: Home ?Disposition: Home ? ?Recommendations for Outpatient Follow-up:  ?Follow up with PCP in 1-2 weeks ?Please obtain BMP/CBC in one week ?Please follow up with surgery and GI at [facility] system as discussed for repeat imaging and follow-up and postsurgical care ? ?Home Health: None ?Equipment/Devices: None ? ?Discharge Condition: Stable ?CODE STATUS: Full ?Diet recommendation: Diabetic diet as tolerated ? ?Brief/Interim Summary: ?Lindsey Vazquez is a 51 y.o. female with medical history significant for T2DM, hypertension, hyperlipidemia, paroxysmal atrial fibrillation, NASH, GERD and obesity who presented to the emergency department due to nausea, vomiting and diarrhea which started just prior to admission.  Patient had gastric sleeve done at [facility] in January of this year and has had chronic nausea and inability to tolerate p.o. at her previous baseline since this procedure.  She has had multiple episodes of DKA in the interim likely in the setting of prolonged fasting periods.  She has followed up with [facility] previously who discussed her chronic nausea was likely related to metabolic derangements in the setting of DKA, however despite appropriately controlled glucose at home prior to this last episode of DKA and with appropriately controlled glucose here over the past 48 hours patient continues to have remarkably uncontrolled nausea requiring around-the-clock Phenergan and Zofran.  ? ?Recommend close follow-up with general surgery and possibly GI for further evaluation of ongoing intractable nausea postoperatively given less likely metabolic in origin.  We have also discontinued her metformin at this time in hopes this may improve patient's symptoms somewhat but would defer to general surgery and GI for further  imaging, endoscopy, and further testing. ? ?Discharge Diagnoses:  ?Principal Problem: ?  DKA (diabetic ketoacidosis) ([place]) ?Active Problems: ?  Leukocytosis ?  Uncontrolled type 2 diabetes mellitus with hyperglycemia ([place]) ?  Essential hypertension, benign ?  Mixed hyperlipidemia ?  OSA (obstructive sleep apnea) ?  Class 3 obesity (HCC) ?  Paroxysmal atrial fibrillation (HCC) ?  Intractable nausea and vomiting ?  Diarrhea ? ? ? ?Discharge Instructions ? ?Discharge Instructions   ? ? Discharge patient   Complete by: As directed ?  ? Discharge disposition: 01-Home or Self Care  ? Discharge patient date: 10/29/2021  ? ?  ? ?Allergies as of 10/29/2021   ?No Known Allergies ?  ? ?  ?Medication List  ?  ? ?STOP taking these medications   ? ?DULoxetine 60 MG capsule ?Commonly known as: CYMBALTA ?  ?Eliquis 5 MG Tabs tablet ?Generic drug: apixaban ?  ?hydrOXYzine 50 MG capsule ?Commonly known as: VISTARIL ?  ?metFORMIN 500 MG tablet ?Commonly known as: GLUCOPHAGE ?  ? ?  ? ?TAKE these medications   ? ?acetaminophen 500 MG tablet ?Commonly known as: TYLENOL ?Take 1 tablet (500 mg total) by mouth every 6 (six) hours as needed for mild pain, fever or headache. ?  ?aspirin 81 MG EC tablet ?Take 1 tablet by mouth daily. ?  ?atorvastatin 20 MG tablet ?Commonly known as: LIPITOR ?Take 4 tablets (80 mg total) by mouth daily. ?  ?docusate sodium 100 MG capsule ?Commonly known as: Colace ?Take 1 capsule (100 mg total) by mouth 2 (two) times daily as needed for mild constipation. ?  ?FreeStyle [place] 2 Reader Kerrin Mo ?As directed ?  ?FreeStyle Libre 2 Sensor Misc ?1 Piece by Does not apply route every 14 (fourteen) days. ?  ?  GNP Vitamin B-1 100 MG tablet ?Generic drug: thiamine ?Take 100 mg by mouth daily. ?  ?magnesium oxide 400 (240 Mg) MG tablet ?Commonly known as: MAG-OX ?Take 1 tablet by mouth daily. ?  ?metoCLOPramide 5 MG tablet ?Commonly known as: Reglan ?Take 1 tablet (5 mg total) by mouth 3 (three) times daily. ?  ?metoprolol  succinate 100 MG 24 hr tablet ?Commonly known as: TOPROL-XL ?Take 50 mg by mouth daily. ?  ?multivitamin tablet ?Take 1 tablet by mouth daily. ?  ?ondansetron 4 MG tablet ?Commonly known as: Zofran ?Take 1 tablet (4 mg total) by mouth daily as needed for nausea or vomiting. ?  ?pantoprazole 40 MG tablet ?Commonly known as: Protonix ?Take 1 tablet (40 mg total) by mouth 2 (two) times daily. ?  ?promethazine 25 MG tablet ?Commonly known as: PHENERGAN ?Take 1 tablet (25 mg total) by mouth every 6 (six) hours as needed for nausea or vomiting. ?  ?Toujeo Max SoloStar 300 UNIT/ML Solostar Pen ?Generic drug: insulin glargine (2 Unit Dial) ?Inject 34 Units into the skin daily. ?  ? ?  ? ? ?No Known Allergies ? ?Consultations: ?None ? ? ?Procedures/Studies: ?No results found. ? ? ?Subjective: No acute issues or events overnight, nausea ongoing but moderately well controlled on current Zofran and Phenergan alternating doses.  She denies any overt vomiting over the past 12 hours and is able to tolerate liquids well at this time.  Otherwise denies chest pain headache fevers chills shortness of breath. ? ? ?Discharge Exam: ?Vitals:  ? 10/29/21 1124 10/29/21 1200  ?BP:  (!) 147/68  ?Pulse: (!) 119 (!) 124  ?Resp: 15 14  ?Temp: 99.1 ?F (37.3 ?C)   ?SpO2: 94% 94%  ? ?Vitals:  ? 10/29/21 1100 10/29/21 1101 10/29/21 1124 10/29/21 1200  ?BP:  (!) 180/90  (!) 147/68  ?Pulse: (!) 117 (!) 113 (!) 119 (!) 124  ?Resp: '16 15 15 14  '$ ?Temp:   99.1 ?F (37.3 ?C)   ?TempSrc:   Oral   ?SpO2: 95% 94% 94% 94%  ?Weight:      ?Height:      ? ? ?General: Pt is alert, awake, not in acute distress ?Cardiovascular: RRR, S1/S2 +, no rubs, no gallops ?Respiratory: CTA bilaterally, no wheezing, no rhonchi ?Abdominal: Soft, NT, ND, bowel sounds + ?Extremities: no edema, no cyanosis ? ?The results of significant diagnostics from this hospitalization (including imaging, microbiology, ancillary and laboratory) are listed below for reference.    ? ? ?Microbiology: ?Recent Results (from the past 240 hour(s))  ?MRSA Next Gen by PCR, Nasal     Status: Abnormal  ? Collection Time: 10/28/21  1:19 AM  ? Specimen: Nasal Mucosa; Nasal Swab  ?Result Value Ref Range Status  ? MRSA by PCR Next Gen DETECTED (A) NOT DETECTED Final  ?  Comment: RESULT CALLED TO, READ BACK BY AND VERIFIED WITH: ?KING @ 0917 ON [id number] BY HENDERSON L ?(NOTE) ?The GeneXpert MRSA Assay (FDA approved for NASAL specimens only), ?is one component of a comprehensive MRSA colonization surveillance ?program. It is not intended to diagnose MRSA infection nor to guide ?or monitor treatment for MRSA infections. ?Test performance is not FDA approved in patients less than 2 years ?old. ?Performed at [facility], [address], [place], [place] [address] ?  ?  ? ?Labs: ?BNP (last 3 results) ?No results for input(s): BNP in the last 8760 hours. ?Basic Metabolic Panel: ?Recent Labs  ?Lab 10/27/21 ?2041 10/28/21 ?2956 10/28/21 ?2130 10/28/21 ?8657  10/28/21 ?1350  ?NA 134* 137 136 137 134*  ?K 3.9 4.1 3.4* 3.4* 3.7  ?CL 95* 106 103 102 101  ?CO2 20* 20* 22 23 20*  ?GLUCOSE 285* 187* 170* 173* 246*  ?BUN '16 13 14 13 13  '$ ?CREATININE 1.33* 1.02* 0.99 0.96 1.01*  ?CALCIUM 9.8 7.9* 8.6* 9.2 8.9  ?MG  --   --  1.5*  --   --   ?PHOS  --   --  2.1*  --   --   ? ?Liver Function Tests: ?Recent Labs  ?Lab 10/27/21 ?2041  ?AST 21  ?ALT 17  ?ALKPHOS 94  ?BILITOT 1.8*  ?PROT 8.1  ?ALBUMIN 4.0  ? ?Recent Labs  ?Lab 10/27/21 ?2041  ?LIPASE 27  ? ?No results for input(s): AMMONIA in the last 168 hours. ?CBC: ?Recent Labs  ?Lab 10/27/21 ?2041 10/28/21 ?0415  ?WBC 16.4* 13.3*  ?HGB 14.1 12.1  ?HCT 44.0 37.1  ?MCV 88.2 86.9  ?PLT 198 170  ? ?Cardiac Enzymes: ?No results for input(s): CKTOTAL, CKMB, CKMBINDEX, TROPONINI in the last 168 hours. ?BNP: ?Invalid input(s): POCBNP ?CBG: ?Recent Labs  ?Lab 10/28/21 ?1113 10/28/21 ?1620 10/28/21 ?1957 10/29/21 ?2585 10/29/21 ?1123  ?GLUCAP 117* 280* 232* 268* 222*  ? ?D-Dimer ?No  results for input(s): DDIMER in the last 72 hours. ?Hgb A1c ?No results for input(s): HGBA1C in the last 72 hours. ?Lipid Profile ?No results for input(s): CHOL, HDL, LDLCALC, TRIG, CHOLHDL, LDLDIRECT in the last 72 hours. ?Thyroid

## 2021-10-29 DIAGNOSIS — E1110 Type 2 diabetes mellitus with ketoacidosis without coma: Secondary | ICD-10-CM | POA: Diagnosis not present

## 2021-10-29 DIAGNOSIS — R197 Diarrhea, unspecified: Secondary | ICD-10-CM | POA: Diagnosis not present

## 2021-10-29 DIAGNOSIS — R112 Nausea with vomiting, unspecified: Secondary | ICD-10-CM | POA: Diagnosis not present

## 2021-10-29 LAB — GLUCOSE, CAPILLARY
Glucose-Capillary: 268 mg/dL — ABNORMAL HIGH (ref 70–99)
Glucose-Capillary: 222 mg/dL — ABNORMAL HIGH (ref 70–99)

## 2021-10-29 MED ORDER — ONDANSETRON HCL 4 MG PO TABS: 4.0000 mg | ORAL_TABLET | Freq: Every day | ORAL | 1 refills | Status: AC | PRN

## 2021-10-29 MED ORDER — PROMETHAZINE HCL 25 MG PO TABS: 25.0000 mg | ORAL_TABLET | Freq: Four times a day (QID) | ORAL | 0 refills | Status: DC | PRN

## 2021-10-29 MED ORDER — PROMETHAZINE HCL 12.5 MG PO TABS
25.0000 mg | ORAL_TABLET | Freq: Once | ORAL | Status: AC
  Administered 2021-10-29: 25 mg via ORAL
  Filled 2021-10-29: qty 2

## 2021-10-29 MED ORDER — METOPROLOL TARTRATE 5 MG/5ML IV SOLN: 2.5000 mg | Freq: Four times a day (QID) | INTRAVENOUS | Status: DC | PRN

## 2021-10-29 NOTE — Progress Notes (Signed)
AVS instructions discussed with patient and family at bedside, including new prescriptions and medication changes.  Metformin instructions confirmed with MD.  Patient and family verbalized understanding.  PIVs removed with catheter intact and sites clean and dry.  Patient alert and oriented x4 with all belongings.  Patient taken to private vehicle via wheelchair by RN.  ?

## 2021-10-29 NOTE — Progress Notes (Signed)
Patient ST with HR in 130s. MD notified. Orders given for PRN lopressor for HR > 120.   ?

## 2021-10-31 LAB — CBC
HCT: 44 % (ref 36.0–46.0)
Hemoglobin: 14.1 g/dL (ref 12.0–15.0)
MCH: 28.3 pg (ref 26.0–34.0)
MCHC: 32 g/dL (ref 30.0–36.0)
MCV: 88.2 fL (ref 80.0–100.0)
Platelets: 198 10*3/uL (ref 150–400)
RBC: 4.99 MIL/uL (ref 3.87–5.11)
RDW: 15.1 % (ref 11.5–15.5)
WBC: 16.4 10*3/uL — ABNORMAL HIGH (ref 4.0–10.5)
nRBC: 0 % (ref 0.0–0.2)

## 2021-12-08 ENCOUNTER — Ambulatory Visit (INDEPENDENT_AMBULATORY_CARE_PROVIDER_SITE_OTHER): Payer: 59 | Admitting: "Endocrinology

## 2021-12-08 ENCOUNTER — Encounter: Payer: Self-pay | Admitting: "Endocrinology

## 2021-12-08 ENCOUNTER — Ambulatory Visit: Payer: 59 | Admitting: "Endocrinology

## 2021-12-08 VITALS — BP 112/72 | HR 80 | Ht 61.0 in | Wt 236.2 lb

## 2021-12-08 DIAGNOSIS — E1165 Type 2 diabetes mellitus with hyperglycemia: Secondary | ICD-10-CM

## 2021-12-08 DIAGNOSIS — I10 Essential (primary) hypertension: Secondary | ICD-10-CM

## 2021-12-08 DIAGNOSIS — E782 Mixed hyperlipidemia: Secondary | ICD-10-CM | POA: Diagnosis not present

## 2021-12-08 DIAGNOSIS — E559 Vitamin D deficiency, unspecified: Secondary | ICD-10-CM

## 2021-12-08 NOTE — Patient Instructions (Signed)

## 2021-12-08 NOTE — Progress Notes (Signed)
12/08/2021, 4:46 PM                                                     Endocrinology follow-up note   Subjective:    Patient ID: Lindsey Vazquez, female    DOB: 13-Apr-1971.  Lindsey Vazquez is being seen in follow-up for management of currently uncontrolled symptomatic type 2 diabetes, hyperlipidemia, vitamin D deficiency.  PCP:   Renee Rival, NP.   Past Medical History:  Diagnosis Date   Diabetes mellitus without complication ([place])    Hyperlipemia    Hypertension    Past Surgical History:  Procedure Laterality Date   c-section     CARDIAC CATHETERIZATION  07/2021   LAPAROSCOPIC GASTRIC SLEEVE RESECTION  08/11/2021   Social History   Socioeconomic History   Marital status: Married    Spouse name: Not on file   Number of children: Not on file   Years of education: Not on file   Highest education level: Not on file  Occupational History   Not on file  Tobacco Use   Smoking status: Former    Types: Cigarettes    Quit date: 07/25/2015    Years since quitting: 6.3   Smokeless tobacco: Never  Vaping Use   Vaping Use: Former  Substance and Sexual Activity   Alcohol use: No    Comment: socially   Drug use: No   Sexual activity: Yes  Other Topics Concern   Not on file  Social History Narrative   Not on file   Social Determinants of Health   Financial Resource Strain: Not on file  Food Insecurity: Not on file  Transportation Needs: Not on file  Physical Activity: Not on file  Stress: Not on file  Social Connections: Not on file   Outpatient Encounter Medications as of 12/08/2021  Medication Sig   insulin lispro (HUMALOG) 100 UNIT/ML KwikPen Inject 8-14 Units into the skin 3 (three) times daily before meals.   acetaminophen (TYLENOL) 500 MG tablet Take 1 tablet (500 mg total) by mouth every 6 (six) hours as needed for mild pain, fever or headache.   aspirin 81 MG EC tablet Take 1 tablet by mouth daily.    atorvastatin (LIPITOR) 20 MG tablet Take 4 tablets (80 mg total) by mouth daily.   Continuous Blood Gluc Receiver (FREESTYLE LIBRE 2 READER) DEVI As directed   Continuous Blood Gluc Sensor (FREESTYLE LIBRE 2 SENSOR) MISC 1 Piece by Does not apply route every 14 (fourteen) days.   docusate sodium (COLACE) 100 MG capsule Take 1 capsule (100 mg total) by mouth 2 (two) times daily as needed for mild constipation.   GNP VITAMIN B-1 100 MG tablet Take 100 mg by mouth daily.   insulin glargine, 2 Unit Dial, (TOUJEO MAX SOLOSTAR) 300 UNIT/ML Solostar Pen Inject 34 Units into the skin daily.   magnesium oxide (MAG-OX) 400 (240 Mg) MG tablet Take 1 tablet by mouth daily.   metoCLOPramide (REGLAN) 5 MG tablet Take 1 tablet (5 mg total) by mouth 3 (three) times daily.   metoprolol succinate (TOPROL-XL) 100 MG 24 hr tablet Take 50 mg by mouth daily.   Multiple Vitamin (MULTIVITAMIN) tablet Take 1 tablet by mouth daily.   ondansetron (ZOFRAN) 4 MG  tablet Take 1 tablet (4 mg total) by mouth daily as needed for nausea or vomiting.   pantoprazole (PROTONIX) 40 MG tablet Take 1 tablet (40 mg total) by mouth 2 (two) times daily.   promethazine (PHENERGAN) 25 MG tablet Take 1 tablet (25 mg total) by mouth every 6 (six) hours as needed for nausea or vomiting.   No facility-administered encounter medications on file as of 12/08/2021.    ALLERGIES: Allergies  Allergen Reactions   Metformin And Related Nausea And Vomiting    VACCINATION STATUS:  There is no immunization history on file for this patient.  Diabetes She presents for her follow-up diabetic visit. She has type 2 diabetes mellitus. Onset time: Diagnosed at approximate age of 53 years, after episode of gestational diabetes. Her disease course has been worsening. There are no hypoglycemic associated symptoms. Pertinent negatives for hypoglycemia include no confusion, headaches, pallor or seizures. Associated symptoms include foot paresthesias, polydipsia  and polyphagia. Pertinent negatives for diabetes include no chest pain, no fatigue and no polyuria. There are no hypoglycemic complications. Symptoms are worsening (Since her last visit, she was hospitalized for nausea/vomiting and was diagnosed with diabetes ketoacidosis.). Diabetic complications include nephropathy. (Diabetes ketoacidosis.  She had acute renal failure which required brief.  Of hemodialysis during her hospitalization.) Risk factors for coronary artery disease include diabetes mellitus, dyslipidemia, hypertension, family history, obesity, sedentary lifestyle and tobacco exposure. Current diabetic treatment includes insulin injections. Her weight is increasing steadily. She is following a generally unhealthy diet. When asked about meal planning, she reported none. She rarely participates in exercise. Her home blood glucose trend is increasing steadily. Her breakfast blood glucose range is generally 180-200 mg/dl. Her lunch blood glucose range is generally >200 mg/dl. Her dinner blood glucose range is generally >200 mg/dl. Her bedtime blood glucose range is generally >200 mg/dl. Her overall blood glucose range is >200 mg/dl. (She underwent sleeve gastrectomy on August 11, 2021.  This resulted in loss of 40 pounds, she has not lost any further weight since last visit.  She presents with her CGM device.  AGP report shows 26% time range, 26% level 1 hyperglycemia, 48% level 2 hyperglycemia.  No hypoglycemia.  Her recent A1c was 9.8%. ) An ACE inhibitor/angiotensin II receptor blocker is not being taken.  Hyperlipidemia This is a chronic problem. The current episode started more than 1 year ago. The problem is uncontrolled. Exacerbating diseases include diabetes and obesity. Pertinent negatives include no chest pain, myalgias or shortness of breath. Current antihyperlipidemic treatment includes statins. Risk factors for coronary artery disease include dyslipidemia, diabetes mellitus, hypertension,  family history, obesity and a sedentary lifestyle.  Hypertension This is a chronic problem. The current episode started more than 1 year ago. Pertinent negatives include no chest pain, headaches, palpitations or shortness of breath. Risk factors for coronary artery disease include diabetes mellitus, dyslipidemia, sedentary lifestyle, smoking/tobacco exposure and obesity.   Review of systems  Constitutional: + Minimally fluctuating body weight,  current  Body mass index is 44.63 kg/m. , no fatigue, no subjective hyperthermia, no subjective hypothermia   Objective:    BP 112/72   Pulse 80   Ht '5\' 1"'$  (1.549 m)   Wt 236 lb 3.2 oz (107.1 kg)   BMI 44.63 kg/m   Wt Readings from Last 3 Encounters:  12/08/21 236 lb 3.2 oz (107.1 kg)  10/28/21 228 lb 13.4 oz (103.8 kg)  09/29/21 235 lb (106.6 kg)      Physical Exam- Limited  Constitutional:  Body mass index is 44.63 kg/m. , not in acute distress, normal state of mind Abdomen: Skin exam is not convincing for the amount of insulin she is given to inject.   CMP     Component Value Date/Time   NA 134 (L) 10/28/2021 1350   NA 141 09/23/2020 0803   K 3.7 10/28/2021 1350   CL 101 10/28/2021 1350   CO2 20 (L) 10/28/2021 1350   GLUCOSE 246 (H) 10/28/2021 1350   BUN 13 10/28/2021 1350   BUN 11 09/23/2020 0803   CREATININE 1.01 (H) 10/28/2021 1350   CREATININE 0.55 12/29/2019 1004   CALCIUM 8.9 10/28/2021 1350   PROT 8.1 10/27/2021 2041   PROT 8.0 09/23/2020 0803   ALBUMIN 4.0 10/27/2021 2041   ALBUMIN 4.7 09/23/2020 0803   AST 21 10/27/2021 2041   ALT 17 10/27/2021 2041   ALKPHOS 94 10/27/2021 2041   BILITOT 1.8 (H) 10/27/2021 2041   BILITOT 0.3 09/23/2020 0803   GFRNONAA >60 10/28/2021 1350   GFRNONAA 109 12/29/2019 1004   GFRAA 127 12/29/2019 1004   Lipid Panel     Component Value Date/Time   CHOL 164 12/29/2019 1004   TRIG 75 12/29/2019 1004   HDL 49 (L) 12/29/2019 1004   CHOLHDL 3.3 12/29/2019 1004   LDLCALC 99  12/29/2019 1004     Assessment & Plan:   1. Uncontrolled type 2 diabetes mellitus with hyperglycemia (HCC)  - Shandrell G Riedesel has currently uncontrolled symptomatic type 2 DM since 51 years of age.  She underwent sleeve gastrectomy on August 11, 2021.  This resulted in loss of 40 pounds, she has not lost any further weight since last visit.  She presents with her CGM device.  AGP report shows 26% time range, 26% level 1 hyperglycemia, 48% level 2 hyperglycemia.  No hypoglycemia.  Her recent A1c was 9.8%.  -She did not have  hypoglycemic episodes since last visit.   Recent labs reviewed, which also shows significant vitamin D deficiency.  -her diabetes is complicated by DKA, obesity, and sedentary life and she remains at a high risk for more acute and chronic complications which include CAD, CVA, CKD, retinopathy, and neuropathy. These are all discussed in detail with her.  - I have counseled her on diet management and weight loss, by adopting a carbohydrate restricted/protein rich diet.  - she acknowledges that there is a room for improvement in her food and drink choices. - Suggestion is made for her to avoid simple carbohydrates  from her diet including Cakes, Sweet Desserts, Ice Cream, Soda (diet and regular), Sweet Tea, Candies, Chips, Cookies, Store Bought Juices, Alcohol , Artificial Sweeteners,  Coffee Creamer, and "Sugar-free" Products, Lemonade. This will help patient to have more stable blood glucose profile and potentially avoid unintended weight gain.  The following Lifestyle Medicine recommendations according to [facility]) were discussed and and offered to patient and she  agrees to start the journey:  A. Whole Foods, Plant-Based Nutrition comprising of fruits and vegetables, plant-based proteins, whole-grain carbohydrates was discussed in detail with the patient.   A list for source of those nutrients were also provided to the patient.   Patient will use only water or unsweetened tea for hydration. B.  The need to stay away from risky substances including alcohol, smoking; obtaining 7 to 9 hours of restorative sleep, at least 150 minutes of moderate intensity exercise weekly, the importance of healthy social connections,  and stress management techniques  were discussed. C.  A full color page of  Calorie density of various food groups per pound showing examples of each food groups was provided to the patient.      - she is advised to stick to a routine mealtimes to eat 3 meals  a day and avoid unnecessary snacks ( to snack only to correct hypoglycemia).   - I have approached her with the following individualized plan to manage diabetes and patient agrees:  -Prescribed her recent sleeve gastrectomy, apparently patient did not change any behavior. -In light of her presentation with worsening glycemic profile, she will need basal/bolus insulin in order for her to achieve control of diabetes to target.   Accordingly, she is advised to continue Toujeo 30 is 4 units nightly.  She is really initiated on Humalog 8-14 units 3 times daily AC for Premeal blood glucose readings above 90 mg per DL.   She is advised to stay off of metformin for now.  She is encouraged to continue to use her CGM continuously.   - she is encouraged to call clinic for blood glucose levels less than 70 or above 200 mg /dl.   -Advised to stay away from SGLT2 inhibitors given her recent history of euglycemic DKA while taking Jardiance.   - Patient specific target  A1c;  LDL, HDL, Triglycerides were discussed in detail.  2) BP/HTN:   Her blood pressure is controlled to target.    she is currently not taking any blood pressure medications.    3) Lipids/HPL: Her recent lipid panel shows controlled LDL at 82.  She is advised to continue atorvastatin 10 mg p.o. nightly.        Side effects and precautions discussed with her.  4) vitamin  D deficiency- 25  hydroxy vitamin D remains low despite a course of treatment with vitamin D3 5000 units for 90 days .she is approached for retreatment with vitamin D3 5000 units daily for the next 90 days.    5) weight management: Her BMI is 44.4, lowering from 49.7 - she is status post sleeve gastrectomy on August 11, 2021.    6) hypercalcemia: Associated with high PTH.  Her 24-hour urine calcium is not elevated-210.  Still possible for mild early primary hyperparathyroidism.  She will not need acute intervention right now.  She will have repeat PTH /calcium during her subsequent labs.     6) Chronic Care/Health Maintenance:  -she  is on Statin medications and  is encouraged to initiate and continue to follow up with Ophthalmology, Dentist,  Podiatrist at least yearly or according to recommendations, and advised to   stay away from smoking. I have recommended yearly flu vaccine and pneumonia vaccine at least every 5 years; moderate intensity exercise for up to 150 minutes weekly; and  sleep for at least 7 hours a day.  - I advised patient to maintain close follow up with Renee Rival, NP for primary care needs.    I spent 41 minutes in the care of the patient today including review of labs from [place], Lipids, Thyroid Function, Hematology (current and previous including abstractions from other facilities); face-to-face time discussing  her blood glucose readings/logs, discussing hypoglycemia and hyperglycemia episodes and symptoms, medications doses, her options of short and long term treatment based on the latest standards of care / guidelines;  discussion about incorporating lifestyle medicine;  and documenting the encounter.    Please refer to Patient Instructions for Blood Glucose Monitoring and Insulin/Medications Dosing Guide"  in media tab for additional information. Please  also refer to " Patient Self Inventory" in the Media  tab for reviewed elements of pertinent patient history.  Lindsey Vazquez participated in the discussions, expressed understanding, and voiced agreement with the above plans.  All questions were answered to her satisfaction. she is encouraged to contact clinic should she have any questions or concerns prior to her return visit.   Follow up plan: - Return in about 4 weeks (around 01/05/2022) for F/U with Meter/CGM [facility] Simon Only - no Labs.  Glade Lloyd, MD [facility] [address] [place], [place] [address] Phone: [phone]  Fax: [phone]    12/08/2021, 4:46 PM  This note was partially dictated with voice recognition software. Similar sounding words can be transcribed inadequately or may not  be corrected upon review.

## 2022-01-11 ENCOUNTER — Encounter: Payer: Self-pay | Admitting: "Endocrinology

## 2022-01-11 ENCOUNTER — Ambulatory Visit (INDEPENDENT_AMBULATORY_CARE_PROVIDER_SITE_OTHER): Payer: 59 | Admitting: "Endocrinology

## 2022-01-11 VITALS — BP 114/76 | HR 68 | Ht 61.0 in | Wt 232.4 lb

## 2022-01-11 DIAGNOSIS — E1165 Type 2 diabetes mellitus with hyperglycemia: Secondary | ICD-10-CM

## 2022-01-11 DIAGNOSIS — E782 Mixed hyperlipidemia: Secondary | ICD-10-CM | POA: Diagnosis not present

## 2022-01-11 DIAGNOSIS — I10 Essential (primary) hypertension: Secondary | ICD-10-CM | POA: Diagnosis not present

## 2022-01-11 DIAGNOSIS — E559 Vitamin D deficiency, unspecified: Secondary | ICD-10-CM | POA: Diagnosis not present

## 2022-01-11 MED ORDER — TOUJEO MAX SOLOSTAR 300 UNIT/ML ~~LOC~~ SOPN: 40.0000 [IU] | PEN_INJECTOR | Freq: Every day | SUBCUTANEOUS | 1 refills | Status: DC

## 2022-01-11 NOTE — Patient Instructions (Signed)
                                     Advice for Weight Management  -For most of us the best way to lose weight is by diet management. Generally speaking, diet management means consuming less calories intentionally which over time brings about progressive weight loss.  This can be achieved more effectively by avoiding ultra processed carbohydrates, processed meats, unhealthy fats.    It is critically important to know your numbers: how much calorie you are consuming and how much calorie you need. More importantly, our carbohydrates sources should be unprocessed naturally occurring  complex starch food items.  It is always important to balance nutrition also by  appropriate intake of proteins (mainly plant-based), healthy fats/oils, plenty of fruits and vegetables.   -The American College of Lifestyle Medicine (ACL M) recommends nutrition derived mostly from Whole Food, Plant Predominant Sources example an apple instead of applesauce or apple pie. Eat Plenty of vegetables, Mushrooms, fruits, Legumes, Whole Grains, Nuts, seeds in lieu of processed meats, processed snacks/pastries red meat, poultry, eggs.  Use only water or unsweetened tea for hydration.  The College also recommends the need to stay away from risky substances including alcohol, smoking; obtaining 7-9 hours of restorative sleep, at least 150 minutes of moderate intensity exercise weekly, importance of healthy social connections, and being mindful of stress and seek help when it is overwhelming.    -Sticking to a routine mealtime to eat 3 meals a day and avoiding unnecessary snacks is shown to have a big role in weight control. Under normal circumstances, the only time we burn stored energy is when we are hungry, so allow  some hunger to take place- hunger means no food between appropriate meal times, only water.  It is not advisable to starve.   -It is better to avoid simple carbohydrates including:  Cakes, Sweet Desserts, Ice Cream, Soda (diet and regular), Sweet Tea, Candies, Chips, Cookies, Store Bought Juices, Alcohol in Excess of  1-2 drinks a day, Lemonade,  Artificial Sweeteners, Doughnuts, Coffee Creamers, "Sugar-free" Products, etc, etc.  This is not a complete list.....    -Consulting with certified diabetes educators is proven to provide you with the most accurate and current information on diet.  Also, you may be  interested in discussing diet options/exchanges , we can schedule a visit with Lindsey Vazquez, RDN, CDE for individualized nutrition education.  -Exercise: If you are able: 30 -60 minutes a day ,4 days a week, or 150 minutes of moderate intensity exercise weekly.    The longer the better if tolerated.  Combine stretch, strength, and aerobic activities.  If you were told in the past that you have high risk for cardiovascular diseases, or if you are currently symptomatic, you may seek evaluation by your heart doctor prior to initiating moderate to intense exercise programs.                                  Additional Care Considerations for Diabetes/Prediabetes   -Diabetes  is a chronic disease.  The most important care consideration is regular follow-up with your diabetes care provider with the goal being avoiding or delaying its complications and to take advantage of advances in medications and technology.  If appropriate actions are taken early enough, type 2 diabetes can even be   reversed.  Seek information from the right source.  - Whole Food, Plant Predominant Nutrition is highly recommended: Eat Plenty of vegetables, Mushrooms, fruits, Legumes, Whole Grains, Nuts, seeds in lieu of processed meats, processed snacks/pastries red meat, poultry, eggs as recommended by American College of  Lifestyle Medicine (ACLM).  -Type 2 diabetes is known to coexist with other important comorbidities such as high blood pressure and high cholesterol.  It is critical to control not only the  diabetes but also the high blood pressure and high cholesterol to minimize and delay the risk of complications including coronary artery disease, stroke, amputations, blindness, etc.  The good news is that this diet recommendation for type 2 diabetes is also very helpful for managing high cholesterol and high blood blood pressure.  - Studies showed that people with diabetes will benefit from a class of medications known as ACE inhibitors and statins.  Unless there are specific reasons not to be on these medications, the standard of care is to consider getting one from these groups of medications at an optimal doses.  These medications are generally considered safe and proven to help protect the heart and the kidneys.    - People with diabetes are encouraged to initiate and maintain regular follow-up with eye doctors, foot doctors, dentists , and if necessary heart and kidney doctors.     - It is highly recommended that people with diabetes quit smoking or stay away from smoking, and get yearly  flu vaccine and pneumonia vaccine at least every 5 years.  See above for additional recommendations on exercise, sleep, stress management , and healthy social connections.      

## 2022-01-11 NOTE — Progress Notes (Signed)
01/11/2022, 6:22 PM                                                     Endocrinology follow-up note   Subjective:    Patient ID: Lindsey Vazquez, female    DOB: 29-Jul-1970.  [name] is being seen in follow-up for management of currently uncontrolled symptomatic type 2 diabetes, hyperlipidemia, vitamin D deficiency.  PCP:   Renee Rival, NP.   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   Social History   Socioeconomic History   Marital status: Married    Spouse name: Not on file   Number of children: Not on file   Years of education: Not on file   Highest education level: Not on file  Occupational History   Not on file  Tobacco Use   Smoking status: Former    Types: Cigarettes    Quit date: 07/25/2015    Years since quitting: 6.4   Smokeless tobacco: Never  Vaping Use   Vaping Use: Former  Substance and Sexual Activity   Alcohol use: No    Comment: socially   Drug use: No   Sexual activity: Yes  Other Topics Concern   Not on file  Social History Narrative   Not on file   Social Determinants of Health   Financial Resource Strain: Not on file  Food Insecurity: Not on file  Transportation Needs: Not on file  Physical Activity: Not on file  Stress: Not on file  Social Connections: Not on file   Outpatient Encounter Medications as of 01/11/2022  Medication Sig   acetaminophen (TYLENOL) 500 MG tablet Take 1 tablet (500 mg total) by mouth every 6 (six) hours as needed for mild pain, fever or headache.   aspirin 81 MG EC tablet Take 1 tablet by mouth daily.   atorvastatin (LIPITOR) 20 MG tablet Take 4 tablets (80 mg total) by mouth daily.   Continuous Blood Gluc Receiver (FREESTYLE  LIBRE 2 READER) DEVI As directed   Continuous Blood Gluc Sensor (FREESTYLE LIBRE 2 SENSOR) MISC 1 Piece by Does not apply route every 14 (fourteen) days.   docusate sodium (COLACE) 100 MG capsule Take 1 capsule (100 mg total) by mouth 2 (two) times daily as needed for mild constipation.   insulin glargine, 2 Unit Dial, (TOUJEO MAX SOLOSTAR) 300 UNIT/ML Solostar Pen Inject 40 Units into the skin daily.   insulin lispro (HUMALOG) 100 UNIT/ML KwikPen Inject 10-16 Units into the skin 3 (three) times daily before meals.   magnesium oxide (MAG-OX) 400 (240 Mg) MG tablet Take 1 tablet by mouth daily.   metoprolol succinate (TOPROL-XL) 100 MG 24 hr tablet Take 50 mg by mouth daily.   Multiple Vitamin (MULTIVITAMIN) tablet Take 1 tablet by mouth daily.   ondansetron (ZOFRAN) 4 MG tablet Take 1 tablet (4 mg total) by mouth daily as needed for nausea or vomiting.   pantoprazole (PROTONIX) 40 MG tablet Take 1 tablet (40 mg total) by mouth 2 (two)  times daily.   promethazine (PHENERGAN) 25 MG tablet Take 1 tablet (25 mg total) by mouth every 6 (six) hours as needed for nausea or vomiting.   [DISCONTINUED] GNP VITAMIN B-1 100 MG tablet Take 100 mg by mouth daily.   [DISCONTINUED] insulin glargine, 2 Unit Dial, (TOUJEO MAX SOLOSTAR) 300 UNIT/ML Solostar Pen Inject 34 Units into the skin daily. (Patient taking differently: Inject 34 Units into the skin at bedtime.)   [DISCONTINUED] metoCLOPramide (REGLAN) 5 MG tablet Take 1 tablet (5 mg total) by mouth 3 (three) times daily.   No facility-administered encounter medications on file as of 01/11/2022.    ALLERGIES: Allergies  Allergen Reactions   Metformin And Related Nausea And Vomiting    VACCINATION STATUS:  There is no immunization history on file for this patient.  Diabetes She presents for her follow-up diabetic visit. She has type 2 diabetes mellitus. Onset time: Diagnosed at approximate age of 19 years, after episode of gestational diabetes. Her  disease course has been worsening. There are no hypoglycemic associated symptoms. Pertinent negatives for hypoglycemia include no confusion, headaches, pallor or seizures. Associated symptoms include foot paresthesias, polydipsia and polyphagia. Pertinent negatives for diabetes include no chest pain, no fatigue and no polyuria. There are no hypoglycemic complications. Symptoms are improving (Since her last visit, she was hospitalized for nausea/vomiting and was diagnosed with diabetes ketoacidosis.). Diabetic complications include nephropathy. (Diabetes ketoacidosis.  She had acute renal failure which required brief.  Of hemodialysis during her hospitalization.) Risk factors for coronary artery disease include diabetes mellitus, dyslipidemia, hypertension, family history, obesity, sedentary lifestyle and tobacco exposure. Current diabetic treatment includes insulin injections. Her weight is fluctuating minimally. She is following a generally unhealthy diet. When asked about meal planning, she reported none. She rarely participates in exercise. Her home blood glucose trend is decreasing steadily. Her breakfast blood glucose range is generally 180-200 mg/dl. Her lunch blood glucose range is generally >200 mg/dl. Her dinner blood glucose range is generally >200 mg/dl. Her bedtime blood glucose range is generally >200 mg/dl. Her overall blood glucose range is >200 mg/dl. (She presents with slight improvement in her glycemic profile.  Freestyle libre CGM was analyzed.  She has 36% time in range, 22% level 1 hyperglycemia, 40% level 2 hyperglycemia.  She has 2% level 1 hypoglycemia.  This is considered an improvement compared to her last visit.  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  This resulted in loss of 40 pounds.  She did not lose any significant amount of weight since last visit.  Her recent A1c was between 9.8--10.3%. ) An ACE inhibitor/angiotensin II receptor blocker is not being  taken.  Hyperlipidemia This is a chronic problem. The current episode started more than 1 year ago. The problem is uncontrolled. Exacerbating diseases include diabetes and obesity. Pertinent negatives include no chest pain, myalgias or shortness of breath. Current antihyperlipidemic treatment includes statins. Risk factors for coronary artery disease include dyslipidemia, diabetes mellitus, hypertension, family history, obesity and a sedentary lifestyle.  Hypertension This is a chronic problem. The current episode started more than 1 year ago. Pertinent negatives include no chest pain, headaches, palpitations or shortness of breath. Risk factors for coronary artery disease include diabetes mellitus, dyslipidemia, sedentary lifestyle, smoking/tobacco exposure and obesity.    Review of systems  Constitutional: + Minimally fluctuating body weight,  current  Body mass index is 43.91 kg/m. , no fatigue, no subjective hyperthermia, no subjective hypothermia   Objective:    BP 114/76  Pulse 68   Ht '5\' 1"'$  (1.549 m)   Wt 232 lb 6.4 oz (105.4 kg)   BMI 43.91 kg/m   Wt Readings from Last 3 Encounters:  01/11/22 232 lb 6.4 oz (105.4 kg)  12/08/21 236 lb 3.2 oz (107.1 kg)  10/28/21 228 lb 13.4 oz (103.8 kg)      Physical Exam- Limited  Constitutional:  Body mass index is 43.91 kg/m. , not in acute distress, normal state of mind Abdomen: Skin exam is not convincing for the amount of insulin she is given to inject.   CMP     Component Value Date/Time   NA 134 (L) 10/28/2021 1350   NA 141 09/23/2020 0803   K 3.7 10/28/2021 1350   CL 101 10/28/2021 1350   CO2 20 (L) 10/28/2021 1350   GLUCOSE 246 (H) 10/28/2021 1350   BUN 13 10/28/2021 1350   BUN 11 09/23/2020 0803   CREATININE 1.01 (H) 10/28/2021 1350   CREATININE 0.55 12/29/2019 1004   CALCIUM 8.9 10/28/2021 1350   PROT 8.1 10/27/2021 2041   PROT 8.0 09/23/2020 0803   ALBUMIN 4.0 10/27/2021 2041   ALBUMIN 4.7 09/23/2020 0803    AST 21 10/27/2021 2041   ALT 17 10/27/2021 2041   ALKPHOS 94 10/27/2021 2041   BILITOT 1.8 (H) 10/27/2021 2041   BILITOT 0.3 09/23/2020 0803   GFRNONAA >60 10/28/2021 1350   GFRNONAA 109 12/29/2019 1004   GFRAA 127 12/29/2019 1004   Lipid Panel     Component Value Date/Time   CHOL 164 12/29/2019 1004   TRIG 75 12/29/2019 1004   HDL 49 (L) 12/29/2019 1004   CHOLHDL 3.3 12/29/2019 1004   LDLCALC 99 12/29/2019 1004     Assessment & Plan:   1. Uncontrolled type 2 diabetes mellitus with hyperglycemia (HCC)  - Fusaye G Milling has currently uncontrolled symptomatic type 2 DM since 51 years of age.  She presents with slight improvement in her glycemic profile. Freestyle libre CGM was analyzed.  She has 36% time in range, 22% level 1 hyperglycemia, 40% level 2 hyperglycemia.  She has 2% level 1 hypoglycemia.  This is considered an improvement compared to her last visit.  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  This resulted in loss of 40 pounds.  She did not lose any significant amount of weight since last visit.  Her recent A1c was between 9.8--10.3%.  -She did not have  hypoglycemic episodes since last visit.   Recent labs reviewed, which also shows significant vitamin D deficiency.  -her diabetes is complicated by DKA, obesity, and sedentary life and she remains at a high risk for more acute and chronic complications which include CAD, CVA, CKD, retinopathy, and neuropathy. These are all discussed in detail with her.  - I have counseled her on diet management and weight loss, by adopting a carbohydrate restricted/protein rich diet.  -- she acknowledges that there is a room for improvement in her food and drink choices. - Suggestion is made for her to avoid simple carbohydrates  from her diet including Cakes, Sweet Desserts, Ice Cream, Soda (diet and regular), Sweet Tea, Candies, Chips, Cookies, Store Bought Juices, Alcohol , Artificial Sweeteners,  Coffee  Creamer, and "Sugar-free" Products, Lemonade. This will help patient to have more stable blood glucose profile and potentially avoid unintended weight gain.  The following Lifestyle Medicine recommendations according to [place]  [facility]) were discussed and and offered to patient and she  agrees to start the journey:  A. Whole Foods, Plant-Based Nutrition comprising of fruits and vegetables, plant-based proteins, whole-grain carbohydrates was discussed in detail with the patient.   A list for source of those nutrients were also provided to the patient.  Patient will use only water or unsweetened tea for hydration. B.  The need to stay away from risky substances including alcohol, smoking; obtaining 7 to 9 hours of restorative sleep, at least 150 minutes of moderate intensity exercise weekly, the importance of healthy social connections,  and stress management techniques were discussed. C.  A full color page of  Calorie density of various food groups per pound showing examples of each food groups was provided to the patient.   - she is advised to stick to a routine mealtimes to eat 3 meals  a day and avoid unnecessary snacks ( to snack only to correct hypoglycemia).   - I have approached her with the following individualized plan to manage diabetes and patient agrees:  -Despite  her recent sleeve gastrectomy, apparently patient did not change any behavior. -In light of her presentation with worsening glycemic profile, she will continue to need basal/bolus insulin in order for her to achieve control of diabetes to target.   Accordingly, she is advised to continue Toujeo to 40 units nightly, she is advised to increase Humalog to 10-16  units 3 times daily AC for Premeal blood glucose readings above 90 mg per DL.   She is advised to stay off of metformin for now.  She is encouraged to continue to use her CGM continuously.   - she is encouraged to call clinic for blood  glucose levels less than 70 or above 200 mg /dl.   -Advised to stay away from SGLT2 inhibitors given her recent history of euglycemic DKA while taking Jardiance.   - Patient specific target  A1c;  LDL, HDL, Triglycerides were discussed in detail.  2) BP/HTN:  -Her blood pressure is controlled to target.    she is currently not taking any blood pressure medications.    3) Lipids/HPL: Her recent lipid panel shows worsening LDL to 99.  She is advised to continue atorvastatin 10 mg p.o. nightly.      Side effects and precautions discussed with her.  4) vitamin  D deficiency- 25 hydroxy vitamin D remains low despite a course of treatment with vitamin D3 5000 units for 90 days .she is approached for retreatment with vitamin D3 5000 units daily for the next 90 days.    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    6) hypercalcemia: Associated with high PTH.  Her 24-hour urine calcium is not elevated-210.  Still possible for mild early primary hyperparathyroidism.  She will not need acute intervention right now.  She will have repeat PTH /calcium during her subsequent labs.     6) Chronic Care/Health Maintenance:  -she  is on Statin medications and  is encouraged to initiate and continue to follow up with Ophthalmology, Dentist,  Podiatrist at least yearly or according to recommendations, and advised to   stay away from smoking. I have recommended yearly flu vaccine and pneumonia vaccine at least every 5 years; moderate intensity exercise for up to 150 minutes weekly; and  sleep for at least 7 hours a day.  - I advised patient to maintain close follow up with Renee Rival, NP for primary care needs.  I spent 41 minutes in the care of the patient today including review of labs from [place], Lipids, Thyroid Function, Hematology (current and previous including abstractions from other facilities); face-to-face time  discussing  her blood glucose readings/logs, discussing hypoglycemia and hyperglycemia episodes and symptoms, medications doses, her options of short and long term treatment based on the latest standards of care / guidelines;  discussion about incorporating lifestyle medicine;  and documenting the encounter.    Please refer to Patient Instructions for Blood Glucose Monitoring and Insulin/Medications Dosing Guide"  in media tab for additional information. Please  also refer to " Patient Self Inventory" in the Media  tab for reviewed elements of pertinent patient history.  [name] participated in the discussions, expressed understanding, and voiced agreement with the above plans.  All questions were answered to her satisfaction. she is encouraged to contact clinic should she have any questions or concerns prior to her return visit.    Follow up plan: - Return in about 9 weeks (around 03/15/2022) for Bring Meter/CGM Device/Logs- A1c in Office.  Glade Lloyd, MD [facility] [address] [place], [place] [address] Phone: [phone]  Fax: [phone]    01/11/2022, 6:22 PM  This note was partially dictated with voice recognition software. Similar sounding words can be transcribed inadequately or may not  be corrected upon review.

## 2022-01-18 ENCOUNTER — Other Ambulatory Visit: Payer: Self-pay | Admitting: "Endocrinology

## 2022-02-02 ENCOUNTER — Other Ambulatory Visit: Payer: Self-pay | Admitting: "Endocrinology

## 2022-02-10 ENCOUNTER — Other Ambulatory Visit (HOSPITAL_COMMUNITY): Payer: Self-pay | Admitting: Nephrology

## 2022-02-10 DIAGNOSIS — E1122 Type 2 diabetes mellitus with diabetic chronic kidney disease: Secondary | ICD-10-CM

## 2022-02-16 ENCOUNTER — Ambulatory Visit (HOSPITAL_COMMUNITY)
Admission: RE | Admit: 2022-02-16 | Discharge: 2022-02-16 | Disposition: A | Discharge status: Home / Self Care | Payer: 59 | Source: Ambulatory Visit | Attending: Nephrology | Admitting: Nephrology

## 2022-02-16 DIAGNOSIS — E1122 Type 2 diabetes mellitus with diabetic chronic kidney disease: Secondary | ICD-10-CM | POA: Insufficient documentation

## 2022-03-15 ENCOUNTER — Encounter: Payer: Self-pay | Admitting: "Endocrinology

## 2022-03-15 ENCOUNTER — Ambulatory Visit (INDEPENDENT_AMBULATORY_CARE_PROVIDER_SITE_OTHER): Payer: 59 | Admitting: "Endocrinology

## 2022-03-15 VITALS — BP 122/80 | HR 112 | Ht 61.0 in | Wt 240.4 lb

## 2022-03-15 DIAGNOSIS — Z91199 Patient's noncompliance with other medical treatment and regimen due to unspecified reason: Secondary | ICD-10-CM

## 2022-03-15 DIAGNOSIS — E1165 Type 2 diabetes mellitus with hyperglycemia: Secondary | ICD-10-CM

## 2022-03-15 DIAGNOSIS — I10 Essential (primary) hypertension: Secondary | ICD-10-CM

## 2022-03-15 DIAGNOSIS — E782 Mixed hyperlipidemia: Secondary | ICD-10-CM

## 2022-03-15 DIAGNOSIS — E559 Vitamin D deficiency, unspecified: Secondary | ICD-10-CM | POA: Diagnosis not present

## 2022-03-15 LAB — POCT GLYCOSYLATED HEMOGLOBIN (HGB A1C): HbA1c, POC (controlled diabetic range): 9.8 % — AB (ref 0.0–7.0)

## 2022-03-15 MED ORDER — INSULIN LISPRO PROT & LISPRO (75-25 MIX) 100 UNIT/ML KWIKPEN: 50.0000 [IU] | PEN_INJECTOR | Freq: Two times a day (BID) | SUBCUTANEOUS | 2 refills | Status: DC

## 2022-03-15 NOTE — Progress Notes (Signed)
03/15/2022, 5:25 PM                                                     Endocrinology follow-up note   Subjective:    Patient ID: Lindsey Vazquez, female    DOB: May 01, 1971.  [name] is being seen in follow-up for management of currently uncontrolled symptomatic type 2 diabetes, hyperlipidemia, vitamin D deficiency.  PCP:   Renee Rival, NP.   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   Social History   Socioeconomic History   Marital status: Married    Spouse name: Not on file   Number of children: Not on file   Years of education: Not on file   Highest education level: Not on file  Occupational History   Not on file  Tobacco Use   Smoking status: Former    Types: Cigarettes    Quit date: 07/25/2015    Years since quitting: 6.6   Smokeless tobacco: Never  Vaping Use   Vaping Use: Former  Substance and Sexual Activity   Alcohol use: No    Comment: socially   Drug use: No   Sexual activity: Yes  Other Topics Concern   Not on file  Social History Narrative   Not on file   Social Determinants of Health   Financial Resource Strain: Not on file  Food Insecurity: Not on file  Transportation Needs: Not on file  Physical Activity: Not on file  Stress: Not on file  Social Connections: Not on file   Outpatient Encounter Medications as of 03/15/2022  Medication Sig   Insulin Lispro Prot & Lispro (HUMALOG MIX 75/25 KWIKPEN) (75-25) 100 UNIT/ML Kwikpen Inject 50 Units into the skin 2 (two) times daily before a meal.   acetaminophen (TYLENOL) 500 MG tablet Take 1 tablet (500 mg total) by mouth every 6 (six) hours as needed for mild pain, fever or headache.   aspirin 81 MG EC tablet Take 1  tablet by mouth daily.   atorvastatin (LIPITOR) 20 MG tablet Take 4 tablets (80 mg total) by mouth daily.   Continuous Blood Gluc Receiver (FREESTYLE LIBRE 2 READER) DEVI As directed   Continuous Blood Gluc Sensor (FREESTYLE LIBRE 2 SENSOR) MISC CHANGE EVERY 14 DAYS.   docusate sodium (COLACE) 100 MG capsule Take 1 capsule (100 mg total) by mouth 2 (two) times daily as needed for mild constipation.   magnesium oxide (MAG-OX) 400 (240 Mg) MG tablet Take 1 tablet by mouth daily.   metoprolol succinate (TOPROL-XL) 100 MG 24 hr tablet Take 50 mg by mouth daily.   Multiple Vitamin (MULTIVITAMIN) tablet Take 1 tablet by mouth daily.   ondansetron (ZOFRAN) 4 MG tablet Take 1 tablet (4 mg total) by mouth daily as needed for nausea or vomiting.   pantoprazole (PROTONIX) 40 MG tablet Take 1 tablet (40 mg total) by mouth 2 (two) times daily.   promethazine (PHENERGAN) 25 MG tablet Take 1 tablet (25 mg total) by mouth every 6 (six)  hours as needed for nausea or vomiting.   [DISCONTINUED] insulin lispro (HUMALOG) 100 UNIT/ML KwikPen Inject 10-16 Units into the skin 3 (three) times daily before meals.   [DISCONTINUED] TOUJEO MAX SOLOSTAR 300 UNIT/ML Solostar Pen Inject 45 Units into the skin daily. (Patient taking differently: 40 Units at bedtime.)   No facility-administered encounter medications on file as of 03/15/2022.    ALLERGIES: Allergies  Allergen Reactions   Metformin And Related Nausea And Vomiting    VACCINATION STATUS:  There is no immunization history on file for this patient.  Diabetes She presents for her follow-up diabetic visit. She has type 2 diabetes mellitus. Onset time: Diagnosed at approximate age of 64 years, after episode of gestational diabetes. Her disease course has been worsening. There are no hypoglycemic associated symptoms. Pertinent negatives for hypoglycemia include no confusion, headaches, pallor or seizures. Associated symptoms include foot paresthesias, polydipsia and  polyphagia. Pertinent negatives for diabetes include no chest pain, no fatigue and no polyuria. There are no hypoglycemic complications. Symptoms are improving (Since her last visit, she was hospitalized for nausea/vomiting and was diagnosed with diabetes ketoacidosis.). Diabetic complications include nephropathy. Risk factors for coronary artery disease include diabetes mellitus, dyslipidemia, hypertension, family history, obesity, sedentary lifestyle and tobacco exposure. Current diabetic treatment includes insulin injections. Her weight is increasing steadily. She is following a generally unhealthy diet. When asked about meal planning, she reported none. She rarely participates in exercise. Her home blood glucose trend is increasing steadily. Her breakfast blood glucose range is generally 180-200 mg/dl. Her lunch blood glucose range is generally >200 mg/dl. Her dinner blood glucose range is generally >200 mg/dl. Her bedtime blood glucose range is generally >200 mg/dl. Her overall blood glucose range is >200 mg/dl. (She presents with worsening glycemic profile.  Her AGP report shows 33% time in range, 19% level 1 hyperglycemia, 48% level 2 hyperglycemia.  No hypoglycemia.  Her point-of-care A1c is increasing to 9.8%.    ) An ACE inhibitor/angiotensin II receptor blocker is not being taken.  Hyperlipidemia This is a chronic problem. The current episode started more than 1 year ago. The problem is uncontrolled. Exacerbating diseases include diabetes and obesity. Pertinent negatives include no chest pain, myalgias or shortness of breath. Current antihyperlipidemic treatment includes statins. Risk factors for coronary artery disease include dyslipidemia, diabetes mellitus, hypertension, family history, obesity and a sedentary lifestyle.  Hypertension This is a chronic problem. The current episode started more than 1 year ago. Pertinent negatives include no chest pain, headaches, palpitations or shortness of  breath. Risk factors for coronary artery disease include diabetes mellitus, dyslipidemia, sedentary lifestyle, smoking/tobacco exposure and obesity.    Review of systems  Constitutional: + Minimally fluctuating body weight,  current  Body mass index is 45.42 kg/m. , no fatigue, no subjective hyperthermia, no subjective hypothermia   Objective:    BP 122/80   Pulse (!) 112   Ht '5\' 1"'$  (1.549 m)   Wt 240 lb 6.4 oz (109 kg)   BMI 45.42 kg/m   Wt Readings from Last 3 Encounters:  03/15/22 240 lb 6.4 oz (109 kg)  01/11/22 232 lb 6.4 oz (105.4 kg)  12/08/21 236 lb 3.2 oz (107.1 kg)      Physical Exam- Limited  Constitutional:  Body mass index is 45.42 kg/m. , not in acute distress, normal state of mind Abdomen: Skin exam is not convincing for the amount of insulin she is given to inject.   CMP     Component Value Date/Time  NA 134 (L) 10/28/2021 1350   NA 141 09/23/2020 0803   K 3.7 10/28/2021 1350   CL 101 10/28/2021 1350   CO2 20 (L) 10/28/2021 1350   GLUCOSE 246 (H) 10/28/2021 1350   BUN 13 10/28/2021 1350   BUN 11 09/23/2020 0803   CREATININE 1.01 (H) 10/28/2021 1350   CREATININE 0.55 12/29/2019 1004   CALCIUM 8.9 10/28/2021 1350   PROT 8.1 10/27/2021 2041   PROT 8.0 09/23/2020 0803   ALBUMIN 4.0 10/27/2021 2041   ALBUMIN 4.7 09/23/2020 0803   AST 21 10/27/2021 2041   ALT 17 10/27/2021 2041   ALKPHOS 94 10/27/2021 2041   BILITOT 1.8 (H) 10/27/2021 2041   BILITOT 0.3 09/23/2020 0803   GFRNONAA >60 10/28/2021 1350   GFRNONAA 109 12/29/2019 1004   GFRAA 127 12/29/2019 1004   Lipid Panel     Component Value Date/Time   CHOL 164 12/29/2019 1004   TRIG 75 12/29/2019 1004   HDL 49 (L) 12/29/2019 1004   CHOLHDL 3.3 12/29/2019 1004   LDLCALC 99 12/29/2019 1004     Assessment & Plan:   1. Uncontrolled type 2 diabetes mellitus with hyperglycemia, CKD   - Imoni G Cadogan has currently uncontrolled symptomatic type 2 DM since 51 years of age.  She  presents with worsening glycemic profile.  Her AGP report shows 33% time in range, 19% level 1 hyperglycemia, 48% level 2 hyperglycemia.  No hypoglycemia.  Her point-of-care A1c is increasing to 9.8%.    -She did not have  hypoglycemic episodes since last visit.   Recent labs reviewed, which also shows significant vitamin D deficiency.  -her diabetes is complicated by DKA, obesity, and sedentary life and she remains at a high risk for more acute and chronic complications which include CAD, CVA, CKD, retinopathy, and neuropathy. These are all discussed in detail with her.  - I have counseled her on diet management and weight loss, by adopting a carbohydrate restricted/protein rich diet.  - she acknowledges that there is a room for improvement in her food and drink choices. - Suggestion is made for her to avoid simple carbohydrates  from her diet including Cakes, Sweet Desserts, Ice Cream, Soda (diet and regular), Sweet Tea, Candies, Chips, Cookies, Store Bought Juices, Alcohol , Artificial Sweeteners,  Coffee Creamer, and "Sugar-free" Products, Lemonade. This will help patient to have more stable blood glucose profile and potentially avoid unintended weight gain.  The following Lifestyle Medicine recommendations according to [facility]  [facility]) were discussed and and offered to patient and she  agrees to start the journey:  A. Whole Foods, Plant-Based Nutrition comprising of fruits and vegetables, plant-based proteins, whole-grain carbohydrates was discussed in detail with the patient.   A list for source of those nutrients were also provided to the patient.  Patient will use only water or unsweetened tea for hydration. B.  The need to stay away from risky substances including alcohol, smoking; obtaining 7 to 9 hours of restorative sleep, at least 150 minutes of moderate intensity exercise weekly, the importance of healthy social connections,  and stress management techniques  were discussed. C.  A full color page of  Calorie density of various food groups per pound showing examples of each food groups was provided to the patient.   - she is advised to stick to a routine mealtimes to eat 3 meals  a day and avoid unnecessary snacks ( to snack only to correct hypoglycemia).   - I  have approached her with the following individualized plan to manage diabetes and patient agrees:  -Despite  her recent sleeve gastrectomy, apparently patient did not change any behavior.  Regaining weight loss she has achieved after her surgery.   -In light of her presentation with worsening glycemic profile, she will continue to need basal/bolus insulin in order for her to achieve control of diabetes to target.   Accordingly, she is advised to continue continue Toujeo 40 units nightly, advised to increase Humalog to 14-20  units 3 times daily AC for Premeal blood glucose readings above 90 mg per DL.   -After she finishes her current supplies of Toujeo and NovoLog, she will be switched to Humalog 75/25 50 units twice daily for Premeal blood glucose readings above 90 mg per DL. She is advised to stay off of metformin for now.  She is encouraged to continue to use her CGM continuously.   - she is encouraged to call clinic for blood glucose levels less than 70 or above 200 mg /dl.   -Advised to stay away from SGLT2 inhibitors given her recent history of euglycemic DKA while taking Jardiance.   - Patient specific target  A1c;  LDL, HDL, Triglycerides were discussed in detail.  2) BP/HTN:  -Her blood pressure is controlled to target.    she is currently not taking any blood pressure medications.    3) Lipids/HPL: Her recent lipid panel shows worsening LDL to 99.  She is advised to continue atorvastatin 10 mg p.o. nightly.        Side effects and precautions discussed with her.  4) vitamin  D deficiency- 25 hydroxy vitamin D remains low despite a course of treatment with vitamin D3 5000  units for 90 days .she is approached for retreatment with vitamin D3 5000 units daily for the next 90 days.    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    6) hypercalcemia: Associated with high PTH.  Her 24-hour urine calcium is not elevated-210.  Still possible for mild early primary hyperparathyroidism.  She will not need acute intervention right now.  She will have repeat PTH /calcium during her subsequent labs.     6) Chronic Care/Health Maintenance:  -she  is on Statin medications and  is encouraged to initiate and continue to follow up with Ophthalmology, Dentist, nephrology, podiatrist at least yearly or according to recommendations, and advised to   stay away from smoking. I have recommended yearly flu vaccine and pneumonia vaccine at least every 5 years; moderate intensity exercise for up to 150 minutes weekly; and  sleep for at least 7 hours a day.  - I advised patient to maintain close follow up with Renee Rival, NP for primary care needs.    I spent 40 minutes in the care of the patient today including review of labs from [facility], Lipids, Thyroid Function, Hematology (current and previous including abstractions from other facilities); face-to-face time discussing  her blood glucose readings/logs, discussing hypoglycemia and hyperglycemia episodes and symptoms, medications doses, her options of short and long term treatment based on the latest standards of care / guidelines;  discussion about incorporating lifestyle medicine;  and documenting the encounter. Risk reduction counseling performed per USPSTF guidelines to reduce  obesity and cardiovascular risk factors.     Please refer to Patient Instructions for Blood Glucose Monitoring and Insulin/Medications Dosing Guide"  in media tab for additional information. Please  also refer to "  Patient Self Inventory" in the Media  tab for reviewed elements of pertinent patient  history.  [name] participated in the discussions, expressed understanding, and voiced agreement with the above plans.  All questions were answered to her satisfaction. she is encouraged to contact clinic should she have any questions or concerns prior to her return visit.    Follow up plan: - Return in about 3 months (around 06/15/2022) for Bring Meter/CGM Device/Logs- A1c in Office.  Glade Lloyd, MD [facility] [address] [place], [place] [address] Phone: [phone]  Fax: [phone]    03/15/2022, 5:25 PM  This note was partially dictated with voice recognition software. Similar sounding words can be transcribed inadequately or may not  be corrected upon review.

## 2022-03-15 NOTE — Patient Instructions (Signed)
                                     Advice for Weight Management  -For most of us the best way to lose weight is by diet management. Generally speaking, diet management means consuming less calories intentionally which over time brings about progressive weight loss.  This can be achieved more effectively by avoiding ultra processed carbohydrates, processed meats, unhealthy fats.    It is critically important to know your numbers: how much calorie you are consuming and how much calorie you need. More importantly, our carbohydrates sources should be unprocessed naturally occurring  complex starch food items.  It is always important to balance nutrition also by  appropriate intake of proteins (mainly plant-based), healthy fats/oils, plenty of fruits and vegetables.   -The American College of Lifestyle Medicine (ACL M) recommends nutrition derived mostly from Whole Food, Plant Predominant Sources example an apple instead of applesauce or apple pie. Eat Plenty of vegetables, Mushrooms, fruits, Legumes, Whole Grains, Nuts, seeds in lieu of processed meats, processed snacks/pastries red meat, poultry, eggs.  Use only water or unsweetened tea for hydration.  The College also recommends the need to stay away from risky substances including alcohol, smoking; obtaining 7-9 hours of restorative sleep, at least 150 minutes of moderate intensity exercise weekly, importance of healthy social connections, and being mindful of stress and seek help when it is overwhelming.    -Sticking to a routine mealtime to eat 3 meals a day and avoiding unnecessary snacks is shown to have a big role in weight control. Under normal circumstances, the only time we burn stored energy is when we are hungry, so allow  some hunger to take place- hunger means no food between appropriate meal times, only water.  It is not advisable to starve.   -It is better to avoid simple carbohydrates including:  Cakes, Sweet Desserts, Ice Cream, Soda (diet and regular), Sweet Tea, Candies, Chips, Cookies, Store Bought Juices, Alcohol in Excess of  1-2 drinks a day, Lemonade,  Artificial Sweeteners, Doughnuts, Coffee Creamers, "Sugar-free" Products, etc, etc.  This is not a complete list.....    -Consulting with certified diabetes educators is proven to provide you with the most accurate and current information on diet.  Also, you may be  interested in discussing diet options/exchanges , we can schedule a visit with Lindsey Vazquez, RDN, CDE for individualized nutrition education.  -Exercise: If you are able: 30 -60 minutes a day ,4 days a week, or 150 minutes of moderate intensity exercise weekly.    The longer the better if tolerated.  Combine stretch, strength, and aerobic activities.  If you were told in the past that you have high risk for cardiovascular diseases, or if you are currently symptomatic, you may seek evaluation by your heart doctor prior to initiating moderate to intense exercise programs.                                  Additional Care Considerations for Diabetes/Prediabetes   -Diabetes  is a chronic disease.  The most important care consideration is regular follow-up with your diabetes care provider with the goal being avoiding or delaying its complications and to take advantage of advances in medications and technology.  If appropriate actions are taken early enough, type 2 diabetes can even be   reversed.  Seek information from the right source.  - Whole Food, Plant Predominant Nutrition is highly recommended: Eat Plenty of vegetables, Mushrooms, fruits, Legumes, Whole Grains, Nuts, seeds in lieu of processed meats, processed snacks/pastries red meat, poultry, eggs as recommended by American College of  Lifestyle Medicine (ACLM).  -Type 2 diabetes is known to coexist with other important comorbidities such as high blood pressure and high cholesterol.  It is critical to control not only the  diabetes but also the high blood pressure and high cholesterol to minimize and delay the risk of complications including coronary artery disease, stroke, amputations, blindness, etc.  The good news is that this diet recommendation for type 2 diabetes is also very helpful for managing high cholesterol and high blood blood pressure.  - Studies showed that people with diabetes will benefit from a class of medications known as ACE inhibitors and statins.  Unless there are specific reasons not to be on these medications, the standard of care is to consider getting one from these groups of medications at an optimal doses.  These medications are generally considered safe and proven to help protect the heart and the kidneys.    - People with diabetes are encouraged to initiate and maintain regular follow-up with eye doctors, foot doctors, dentists , and if necessary heart and kidney doctors.     - It is highly recommended that people with diabetes quit smoking or stay away from smoking, and get yearly  flu vaccine and pneumonia vaccine at least every 5 years.  See above for additional recommendations on exercise, sleep, stress management , and healthy social connections.      

## 2022-06-20 ENCOUNTER — Encounter: Payer: Self-pay | Admitting: "Endocrinology

## 2022-06-20 ENCOUNTER — Ambulatory Visit (INDEPENDENT_AMBULATORY_CARE_PROVIDER_SITE_OTHER): Payer: 59 | Admitting: "Endocrinology

## 2022-06-20 VITALS — BP 126/78 | HR 68 | Ht 61.0 in | Wt 245.2 lb

## 2022-06-20 DIAGNOSIS — E1165 Type 2 diabetes mellitus with hyperglycemia: Secondary | ICD-10-CM

## 2022-06-20 DIAGNOSIS — E782 Mixed hyperlipidemia: Secondary | ICD-10-CM

## 2022-06-20 DIAGNOSIS — E559 Vitamin D deficiency, unspecified: Secondary | ICD-10-CM | POA: Diagnosis not present

## 2022-06-20 DIAGNOSIS — I10 Essential (primary) hypertension: Secondary | ICD-10-CM | POA: Diagnosis not present

## 2022-06-20 LAB — POCT GLYCOSYLATED HEMOGLOBIN (HGB A1C): HbA1c, POC (controlled diabetic range): 12.5 % — AB (ref 0.0–7.0)

## 2022-06-20 MED ORDER — INSULIN LISPRO PROT & LISPRO (75-25 MIX) 100 UNIT/ML KWIKPEN: 60.0000 [IU] | PEN_INJECTOR | Freq: Two times a day (BID) | SUBCUTANEOUS | 2 refills | Status: DC

## 2022-06-20 NOTE — Patient Instructions (Signed)

## 2022-06-20 NOTE — Progress Notes (Signed)
06/20/2022, 6:12 PM                                                     Endocrinology follow-up note   Subjective:    Patient ID: Lindsey Vazquez, female    DOB: 03-Feb-1971.  Lindsey Vazquez is being seen in follow-up for management of currently uncontrolled symptomatic type 2 diabetes, hyperlipidemia, vitamin D deficiency.  PCP:   Renee Rival, NP.   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   Social History   Socioeconomic History   Marital status: Married    Spouse name: Not on file   Number of children: Not on file   Years of education: Not on file   Highest education level: Not on file  Occupational History   Not on file  Tobacco Use   Smoking status: Former    Types: Cigarettes    Quit date: 07/25/2015    Years since quitting: 6.9   Smokeless tobacco: Never  Vaping Use   Vaping Use: Former  Substance and Sexual Activity   Alcohol use: No    Comment: socially   Drug use: No   Sexual activity: Yes  Other Topics Concern   Not on file  Social History Narrative   Not on file   Social Determinants of Health   Financial Resource Strain: Not on file  Food Insecurity: Not on file  Transportation Needs: Not on file  Physical Activity: Not on file  Stress: Not on file  Social Connections: Not on file   Outpatient Encounter Medications as of 06/20/2022  Medication Sig   acetaminophen (TYLENOL) 500 MG tablet Take 1 tablet (500 mg total) by mouth every 6 (six) hours as needed for mild pain, fever or headache.   aspirin 81 MG EC tablet Take 1 tablet by mouth daily.   atorvastatin (LIPITOR) 20 MG tablet Take 4 tablets (80 mg total) by mouth daily.   Continuous Blood Gluc Receiver  (FREESTYLE LIBRE 2 READER) DEVI As directed   Continuous Blood Gluc Sensor (FREESTYLE LIBRE 2 SENSOR) MISC CHANGE EVERY 14 DAYS.   docusate sodium (COLACE) 100 MG capsule Take 1 capsule (100 mg total) by mouth 2 (two) times daily as needed for mild constipation.   Insulin Lispro Prot & Lispro (HUMALOG MIX 75/25 KWIKPEN) (75-25) 100 UNIT/ML Kwikpen Inject 60 Units into the skin 2 (two) times daily before a meal.   magnesium oxide (MAG-OX) 400 (240 Mg) MG tablet Take 1 tablet by mouth daily.   metoprolol succinate (TOPROL-XL) 100 MG 24 hr tablet Take 50 mg by mouth daily.   Multiple Vitamin (MULTIVITAMIN) tablet Take 1 tablet by mouth daily.   ondansetron (ZOFRAN) 4 MG tablet Take 1 tablet (4 mg total) by mouth daily as needed for nausea or vomiting.   pantoprazole (PROTONIX) 40 MG tablet Take 1 tablet (40 mg total) by mouth 2 (two) times daily.   promethazine (PHENERGAN) 25 MG tablet Take 1 tablet (25 mg total) by mouth every 6 (six)  hours as needed for nausea or vomiting.   [DISCONTINUED] Insulin Lispro Prot & Lispro (HUMALOG MIX 75/25 KWIKPEN) (75-25) 100 UNIT/ML Kwikpen Inject 50 Units into the skin 2 (two) times daily before a meal.   No facility-administered encounter medications on file as of 06/20/2022.    ALLERGIES: Allergies  Allergen Reactions   Jardiance [Empagliflozin]    Metformin And Related Nausea And Vomiting    VACCINATION STATUS:  There is no immunization history on file for this patient.  Diabetes She presents for her follow-up diabetic visit. She has type 2 diabetes mellitus. Onset time: Diagnosed at approximate age of 5 years, after episode of gestational diabetes. Her disease course has been worsening. There are no hypoglycemic associated symptoms. Pertinent negatives for hypoglycemia include no confusion, headaches, pallor or seizures. Associated symptoms include foot paresthesias, polydipsia and polyphagia. Pertinent negatives for diabetes include no chest pain, no  fatigue and no polyuria. There are no hypoglycemic complications. Symptoms are worsening (Since her last visit, she was hospitalized for nausea/vomiting and was diagnosed with diabetes ketoacidosis.). Diabetic complications include nephropathy. Risk factors for coronary artery disease include diabetes mellitus, dyslipidemia, hypertension, family history, obesity, sedentary lifestyle and tobacco exposure. Current diabetic treatment includes insulin injections. Her weight is increasing steadily. She is following a generally unhealthy diet. When asked about meal planning, she reported none. She rarely participates in exercise. Her home blood glucose trend is increasing steadily. Her breakfast blood glucose range is generally >200 mg/dl. Her lunch blood glucose range is generally >200 mg/dl. Her dinner blood glucose range is generally >200 mg/dl. Her bedtime blood glucose range is generally >200 mg/dl. Her overall blood glucose range is >200 mg/dl. (She presents with worsening glycemic profile.  Her AGP report shows 19% time in range, 16% level 1 hyperglycemia,648% level 2 hyperglycemia.  He did not call clinic for hypoglycemia.  No hypoglycemia.  Her point-of-care A1c is increasing to 12.5% increasing from 9.8%.    ) An ACE inhibitor/angiotensin II receptor blocker is not being taken.  Hyperlipidemia This is a chronic problem. The current episode started more than 1 year ago. The problem is uncontrolled. Exacerbating diseases include diabetes and obesity. Pertinent negatives include no chest pain, myalgias or shortness of breath. Current antihyperlipidemic treatment includes statins. Risk factors for coronary artery disease include dyslipidemia, diabetes mellitus, hypertension, family history, obesity and a sedentary lifestyle.  Hypertension This is a chronic problem. The current episode started more than 1 year ago. Pertinent negatives include no chest pain, headaches, palpitations or shortness of breath. Risk  factors for coronary artery disease include diabetes mellitus, dyslipidemia, sedentary lifestyle, smoking/tobacco exposure and obesity.    Review of systems  Constitutional: + Minimally fluctuating body weight,  current  Body mass index is 46.33 kg/m. , no fatigue, no subjective hyperthermia, no subjective hypothermia   Objective:    BP 126/78   Pulse 68   Ht '5\' 1"'$  (1.549 m)   Wt 245 lb 3.2 oz (111.2 kg)   BMI 46.33 kg/m   Wt Readings from Last 3 Encounters:  06/20/22 245 lb 3.2 oz (111.2 kg)  03/15/22 240 lb 6.4 oz (109 kg)  01/11/22 232 lb 6.4 oz (105.4 kg)      Physical Exam- Limited  Constitutional:  Body mass index is 46.33 kg/m. , not in acute distress, normal state of mind Abdomen: Skin exam is not convincing for the amount of insulin she is given to inject.   CMP     Component Value Date/Time  NA 134 (L) 10/28/2021 1350   NA 141 09/23/2020 0803   K 3.7 10/28/2021 1350   CL 101 10/28/2021 1350   CO2 20 (L) 10/28/2021 1350   GLUCOSE 246 (H) 10/28/2021 1350   BUN 13 10/28/2021 1350   BUN 11 09/23/2020 0803   CREATININE 1.01 (H) 10/28/2021 1350   CREATININE 0.55 12/29/2019 1004   CALCIUM 8.9 10/28/2021 1350   PROT 8.1 10/27/2021 2041   PROT 8.0 09/23/2020 0803   ALBUMIN 4.0 10/27/2021 2041   ALBUMIN 4.7 09/23/2020 0803   AST 21 10/27/2021 2041   ALT 17 10/27/2021 2041   ALKPHOS 94 10/27/2021 2041   BILITOT 1.8 (H) 10/27/2021 2041   BILITOT 0.3 09/23/2020 0803   GFRNONAA >60 10/28/2021 1350   GFRNONAA 109 12/29/2019 1004   GFRAA 127 12/29/2019 1004   Lipid Panel     Component Value Date/Time   CHOL 164 12/29/2019 1004   TRIG 75 12/29/2019 1004   HDL 49 (L) 12/29/2019 1004   CHOLHDL 3.3 12/29/2019 1004   LDLCALC 99 12/29/2019 1004     Assessment & Plan:   1. Uncontrolled type 2 diabetes mellitus with hyperglycemia, CKD   - Wade G Rozycki has currently uncontrolled symptomatic type 2 DM since 51 years of age.  She presents with  worsening glycemic profile.  Her AGP report shows 19% time in range, 16% level 1 hyperglycemia,648% level 2 hyperglycemia.  He did not call clinic for hypoglycemia.  No hypoglycemia.  Her point-of-care A1c is increasing to 12.5% increasing from 9.8%.    -She did not have  hypoglycemic episodes since last visit.   Recent labs reviewed, which also shows significant vitamin D deficiency.  -her diabetes is complicated by DKA, obesity, and sedentary life and she remains at a high risk for more acute and chronic complications which include CAD, CVA, CKD, retinopathy, and neuropathy. These are all discussed in detail with her.  - I have counseled her on diet management and weight loss, by adopting a carbohydrate restricted/protein rich diet. This patient has not engaged in lifestyle medicine.  She is in fact the best candidate for lifestyle medicine in light of the fact that she is losing control of diabetes despite recent bariatric surgery.  - she acknowledges that there is a room for improvement in her food and drink choices. - Suggestion is made for her to avoid simple carbohydrates  from her diet including Cakes, Sweet Desserts, Ice Cream, Soda (diet and regular), Sweet Tea, Candies, Chips, Cookies, Store Bought Juices, Alcohol , Artificial Sweeteners,  Coffee Creamer, and "Sugar-free" Products, Lemonade. This will help patient to have more stable blood glucose profile and potentially avoid unintended weight gain.  The following Lifestyle Medicine recommendations according to [place]  [facility]) were discussed and and offered to patient and she  agrees to start the journey:  A. Whole Foods, Plant-Based Nutrition comprising of fruits and vegetables, plant-based proteins, whole-grain carbohydrates was discussed in detail with the patient.   A list for source of those nutrients were also provided to the patient.  Patient will use only water or unsweetened tea for hydration. B.   The need to stay away from risky substances including alcohol, smoking; obtaining 7 to 9 hours of restorative sleep, at least 150 minutes of moderate intensity exercise weekly, the importance of healthy social connections,  and stress management techniques were discussed. C.  A full color page of  Calorie density of various food groups per pound  showing examples of each food groups was provided to the patient.   - she is advised to stick to a routine mealtimes to eat 3 meals  a day and avoid unnecessary snacks ( to snack only to correct hypoglycemia).   - I have approached her with the following individualized plan to manage diabetes and patient agrees:  -Despite  her recent sleeve gastrectomy, apparently patient did not change any behavior.  Regaining weight loss she has achieved after her surgery.   -In light of her presentation with worsening glycemic profile, she will continue to need multiple daily injections of insulin.  However, this patient could not execute basal/bolus insulin format.    -She will start switch to premixed insulin.  She is advised to increase her Humalog 75/25 to 60 units with breakfast and 60 units with supper for Premeal blood glucose readings above 90 mg per DL.   She is advised to stay off of metformin for now.  She is encouraged to continue to use her CGM continuously.   - she is encouraged to call clinic for blood glucose levels less than 70 or above 200 mg /dl.   -Advised to stay away from SGLT2 inhibitors given her recent history of euglycemic DKA while taking Jardiance.   - Patient specific target  A1c;  LDL, HDL, Triglycerides were discussed in detail.  2) BP/HTN:  -Her blood pressure is controlled to target.    she is currently not taking any blood pressure medications.    3) Lipids/HPL: Her recent lipid panel shows worsening LDL to 99.  She is advised to continue atorvastatin 10 mg p.o. nightly.          Side effects and precautions discussed with  her.  4) vitamin  D deficiency- 25 hydroxy vitamin D remains low despite a course of treatment with vitamin D3 5000 units for 90 days .she is approached for retreatment with vitamin D3 5000 units daily for the next 90 days.    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    6) hypercalcemia: Associated with high PTH.  Her 24-hour urine calcium is not elevated-210.  Still possible for mild early primary hyperparathyroidism.  She will not need acute intervention right now.  She will have repeat PTH /calcium during her subsequent labs.     6) Chronic Care/Health Maintenance:  -she  is on Statin medications and  is encouraged to initiate and continue to follow up with Ophthalmology, Dentist, nephrology, podiatrist at least yearly or according to recommendations, and advised to   stay away from smoking. I have recommended yearly flu vaccine and pneumonia vaccine at least every 5 years; moderate intensity exercise for up to 150 minutes weekly; and  sleep for at least 7 hours a day.  - I advised patient to maintain close follow up with Renee Rival, NP for primary care needs.   I spent 26 minutes in the care of the patient today including review of labs from [facility], Lipids, Thyroid Function, Hematology (current and previous including abstractions from other facilities); face-to-face time discussing  her blood glucose readings/logs, discussing hypoglycemia and hyperglycemia episodes and symptoms, medications doses, her options of short and long term treatment based on the latest standards of care / guidelines;  discussion about incorporating lifestyle medicine;  and documenting the encounter. Risk reduction counseling performed per USPSTF guidelines to reduce  obesity and cardiovascular risk factors.     Please refer  to Patient Instructions for Blood Glucose Monitoring and Insulin/Medications Dosing Guide"  in media tab for  additional information. Please  also refer to " Patient Self Inventory" in the Media  tab for reviewed elements of pertinent patient history.  Lindsey Vazquez participated in the discussions, expressed understanding, and voiced agreement with the above plans.  All questions were answered to her satisfaction. she is encouraged to contact clinic should she have any questions or concerns prior to her return visit.   Follow up plan: - Return in about 2 weeks (around 07/04/2022) for F/U with Meter/CGM [facility] Only - no Labs.  Glade Lloyd, MD [facility] [address] [place], [place] [address] Phone: [phone]  Fax: [phone]    06/20/2022, 6:12 PM  This note was partially dictated with voice recognition software. Similar sounding words can be transcribed inadequately or may not  be corrected upon review.

## 2022-07-13 ENCOUNTER — Ambulatory Visit (INDEPENDENT_AMBULATORY_CARE_PROVIDER_SITE_OTHER): Payer: 59 | Admitting: "Endocrinology

## 2022-07-13 ENCOUNTER — Encounter: Payer: Self-pay | Admitting: "Endocrinology

## 2022-07-13 VITALS — BP 120/78 | HR 72 | Ht 61.0 in | Wt 244.8 lb

## 2022-07-13 DIAGNOSIS — E1165 Type 2 diabetes mellitus with hyperglycemia: Secondary | ICD-10-CM

## 2022-07-13 DIAGNOSIS — I10 Essential (primary) hypertension: Secondary | ICD-10-CM | POA: Diagnosis not present

## 2022-07-13 DIAGNOSIS — E559 Vitamin D deficiency, unspecified: Secondary | ICD-10-CM | POA: Diagnosis not present

## 2022-07-13 DIAGNOSIS — E782 Mixed hyperlipidemia: Secondary | ICD-10-CM | POA: Diagnosis not present

## 2022-07-13 MED ORDER — SEMAGLUTIDE (1 MG/DOSE) 4 MG/3ML ~~LOC~~ SOPN: 1.0000 mg | PEN_INJECTOR | SUBCUTANEOUS | 2 refills | Status: DC

## 2022-07-13 MED ORDER — INSULIN LISPRO PROT & LISPRO (75-25 MIX) 100 UNIT/ML KWIKPEN: 80.0000 [IU] | PEN_INJECTOR | Freq: Two times a day (BID) | SUBCUTANEOUS | 2 refills | Status: DC

## 2022-07-13 NOTE — Patient Instructions (Signed)
                                     Advice for Weight Management  -For most of us the best way to lose weight is by diet management. Generally speaking, diet management means consuming less calories intentionally which over time brings about progressive weight loss.  This can be achieved more effectively by avoiding ultra processed carbohydrates, processed meats, unhealthy fats.    It is critically important to know your numbers: how much calorie you are consuming and how much calorie you need. More importantly, our carbohydrates sources should be unprocessed naturally occurring  complex starch food items.  It is always important to balance nutrition also by  appropriate intake of proteins (mainly plant-based), healthy fats/oils, plenty of fruits and vegetables.   -The American College of Lifestyle Medicine (ACL M) recommends nutrition derived mostly from Whole Food, Plant Predominant Sources example an apple instead of applesauce or apple pie. Eat Plenty of vegetables, Mushrooms, fruits, Legumes, Whole Grains, Nuts, seeds in lieu of processed meats, processed snacks/pastries red meat, poultry, eggs.  Use only water or unsweetened tea for hydration.  The College also recommends the need to stay away from risky substances including alcohol, smoking; obtaining 7-9 hours of restorative sleep, at least 150 minutes of moderate intensity exercise weekly, importance of healthy social connections, and being mindful of stress and seek help when it is overwhelming.    -Sticking to a routine mealtime to eat 3 meals a day and avoiding unnecessary snacks is shown to have a big role in weight control. Under normal circumstances, the only time we burn stored energy is when we are hungry, so allow  some hunger to take place- hunger means no food between appropriate meal times, only water.  It is not advisable to starve.   -It is better to avoid simple carbohydrates including:  Cakes, Sweet Desserts, Ice Cream, Soda (diet and regular), Sweet Tea, Candies, Chips, Cookies, Store Bought Juices, Alcohol in Excess of  1-2 drinks a day, Lemonade,  Artificial Sweeteners, Doughnuts, Coffee Creamers, "Sugar-free" Products, etc, etc.  This is not a complete list.....    -Consulting with certified diabetes educators is proven to provide you with the most accurate and current information on diet.  Also, you may be  interested in discussing diet options/exchanges , we can schedule a visit with Lindsey Vazquez, RDN, CDE for individualized nutrition education.  -Exercise: If you are able: 30 -60 minutes a day ,4 days a week, or 150 minutes of moderate intensity exercise weekly.    The longer the better if tolerated.  Combine stretch, strength, and aerobic activities.  If you were told in the past that you have high risk for cardiovascular diseases, or if you are currently symptomatic, you may seek evaluation by your heart doctor prior to initiating moderate to intense exercise programs.                                  Additional Care Considerations for Diabetes/Prediabetes   -Diabetes  is a chronic disease.  The most important care consideration is regular follow-up with your diabetes care provider with the goal being avoiding or delaying its complications and to take advantage of advances in medications and technology.  If appropriate actions are taken early enough, type 2 diabetes can even be   reversed.  Seek information from the right source.  - Whole Food, Plant Predominant Nutrition is highly recommended: Eat Plenty of vegetables, Mushrooms, fruits, Legumes, Whole Grains, Nuts, seeds in lieu of processed meats, processed snacks/pastries red meat, poultry, eggs as recommended by American College of  Lifestyle Medicine (ACLM).  -Type 2 diabetes is known to coexist with other important comorbidities such as high blood pressure and high cholesterol.  It is critical to control not only the  diabetes but also the high blood pressure and high cholesterol to minimize and delay the risk of complications including coronary artery disease, stroke, amputations, blindness, etc.  The good news is that this diet recommendation for type 2 diabetes is also very helpful for managing high cholesterol and high blood blood pressure.  - Studies showed that people with diabetes will benefit from a class of medications known as ACE inhibitors and statins.  Unless there are specific reasons not to be on these medications, the standard of care is to consider getting one from these groups of medications at an optimal doses.  These medications are generally considered safe and proven to help protect the heart and the kidneys.    - People with diabetes are encouraged to initiate and maintain regular follow-up with eye doctors, foot doctors, dentists , and if necessary heart and kidney doctors.     - It is highly recommended that people with diabetes quit smoking or stay away from smoking, and get yearly  flu vaccine and pneumonia vaccine at least every 5 years.  See above for additional recommendations on exercise, sleep, stress management , and healthy social connections.      

## 2022-07-13 NOTE — Progress Notes (Signed)
07/13/2022, 6:33 PM                                                     Endocrinology follow-up note   Subjective:    Patient ID: Lindsey Vazquez, female    DOB: [birth year].  Lindsey Vazquez is being seen in follow-up for management of currently uncontrolled symptomatic type 2 diabetes, hyperlipidemia, vitamin D deficiency.  PCP:   Renee Rival, NP.   Past Medical History:  Diagnosis Date   Diabetes mellitus without complication ([place])    Hyperlipemia    Hypertension    Past Surgical History:  Procedure Laterality Date   c-section     CARDIAC CATHETERIZATION  07/2021   LAPAROSCOPIC GASTRIC SLEEVE RESECTION  08/11/2021   Social History   Socioeconomic History   Marital status: Married    Spouse name: Not on file   Number of children: Not on file   Years of education: Not on file   Highest education level: Not on file  Occupational History   Not on file  Tobacco Use   Smoking status: Former    Types: Cigarettes    Quit date: 07/25/2015    Years since quitting: 6.9   Smokeless tobacco: Never  Vaping Use   Vaping Use: Former  Substance and Sexual Activity   Alcohol use: No    Comment: socially   Drug use: No   Sexual activity: Yes  Other Topics Concern   Not on file  Social History Narrative   Not on file   Social Determinants of Health   Financial Resource Strain: Not on file  Food Insecurity: Not on file  Transportation Needs: Not on file  Physical Activity: Not on file  Stress: Not on file  Social Connections: Not on file   Outpatient Encounter Medications as of 07/13/2022  Medication Sig   Semaglutide, 1 MG/DOSE, 4 MG/3ML SOPN Inject 1 mg as directed once a week.   telmisartan (MICARDIS) 20 MG tablet Take 0.5 tablets by mouth daily.   acetaminophen (TYLENOL) 500 MG tablet Take 1 tablet (500 mg total) by mouth every 6 (six) hours as needed for mild pain, fever or headache.   aspirin 81 MG EC tablet Take 1  tablet by mouth daily.   atorvastatin (LIPITOR) 20 MG tablet Take 4 tablets (80 mg total) by mouth daily.   Continuous Blood Gluc Receiver (FREESTYLE LIBRE 2 READER) DEVI As directed   Continuous Blood Gluc Sensor (FREESTYLE LIBRE 2 SENSOR) MISC CHANGE EVERY 14 DAYS.   docusate sodium (COLACE) 100 MG capsule Take 1 capsule (100 mg total) by mouth 2 (two) times daily as needed for mild constipation.   Insulin Lispro Prot & Lispro (HUMALOG MIX 75/25 KWIKPEN) (75-25) 100 UNIT/ML Kwikpen Inject 80 Units into the skin 2 (two) times daily before a meal.   magnesium oxide (MAG-OX) 400 (240 Mg) MG tablet Take 1 tablet by mouth daily.   metoprolol succinate (TOPROL-XL) 100 MG 24 hr tablet Take 50 mg by mouth daily.   Multiple Vitamin (MULTIVITAMIN) tablet Take 1 tablet by mouth daily.   ondansetron (ZOFRAN) 4 MG tablet Take 1 tablet (4 mg total) by mouth daily as needed for nausea or vomiting.   pantoprazole (PROTONIX) 40 MG tablet Take  1 tablet (40 mg total) by mouth 2 (two) times daily.   promethazine (PHENERGAN) 25 MG tablet Take 1 tablet (25 mg total) by mouth every 6 (six) hours as needed for nausea or vomiting.   [DISCONTINUED] Insulin Lispro Prot & Lispro (HUMALOG MIX 75/25 KWIKPEN) (75-25) 100 UNIT/ML Kwikpen Inject 60 Units into the skin 2 (two) times daily before a meal.   No facility-administered encounter medications on file as of 07/13/2022.    ALLERGIES: Allergies  Allergen Reactions   Jardiance [Empagliflozin]    Metformin And Related Nausea And Vomiting    VACCINATION STATUS:  There is no immunization history on file for this patient.  Diabetes She presents for her follow-up diabetic visit. She has type 2 diabetes mellitus. Onset time: Diagnosed at approximate age of 19 years, after episode of gestational diabetes. Her disease course has been worsening. There are no hypoglycemic associated symptoms. Pertinent negatives for hypoglycemia include no confusion, headaches, pallor or  seizures. Associated symptoms include foot paresthesias, polydipsia and polyuria. Pertinent negatives for diabetes include no chest pain and no fatigue. There are no hypoglycemic complications. Symptoms are worsening (Since her last visit, she was hospitalized for nausea/vomiting and was diagnosed with diabetes ketoacidosis.). Diabetic complications include nephropathy. Risk factors for coronary artery disease include diabetes mellitus, dyslipidemia, hypertension, family history, obesity, sedentary lifestyle and tobacco exposure. Current diabetic treatment includes insulin injections. Her weight is increasing steadily. She is following a generally unhealthy diet. When asked about meal planning, she reported none. She rarely participates in exercise. Her home blood glucose trend is fluctuating minimally. Her breakfast blood glucose range is generally >200 mg/dl. Her lunch blood glucose range is generally >200 mg/dl. Her dinner blood glucose range is generally >200 mg/dl. Her bedtime blood glucose range is generally >200 mg/dl. Her overall blood glucose range is >200 mg/dl. (She presents with worsening glycemic profile.  Her AGP report shows 21% time in range, 12% level 1 hyperglycemia, 66% level 2 hyperglycemia.  She did not document any hypoglycemia.  Her recent point-of-care A1c was 12.5% increasing from 9.8%.    ) An ACE inhibitor/angiotensin II receptor blocker is not being taken.  Hyperlipidemia This is a chronic problem. The current episode started more than 1 year ago. The problem is uncontrolled. Exacerbating diseases include diabetes and obesity. Pertinent negatives include no chest pain, myalgias or shortness of breath. Current antihyperlipidemic treatment includes statins. Risk factors for coronary artery disease include dyslipidemia, diabetes mellitus, hypertension, family history, obesity and a sedentary lifestyle.  Hypertension This is a chronic problem. The current episode started more than 1  year ago. Pertinent negatives include no chest pain, headaches, palpitations or shortness of breath. Risk factors for coronary artery disease include diabetes mellitus, dyslipidemia, sedentary lifestyle, smoking/tobacco exposure and obesity.    Review of systems  Constitutional: + Minimally fluctuating body weight,  current  Body mass index is 46.25 kg/m. , no fatigue, no subjective hyperthermia, no subjective hypothermia   Objective:    BP 120/78   Pulse 72   Ht '5\' 1"'$  (1.549 m)   Wt 244 lb 12.8 oz (111 kg)   BMI 46.25 kg/m   Wt Readings from Last 3 Encounters:  07/13/22 244 lb 12.8 oz (111 kg)  06/20/22 245 lb 3.2 oz (111.2 kg)  03/15/22 240 lb 6.4 oz (109 kg)      Physical Exam- Limited  Constitutional:  Body mass index is 46.25 kg/m. , not in acute distress, normal state of mind Abdomen: Skin exam is not  convincing for the amount of insulin she is given to inject.   CMP     Component Value Date/Time   NA 134 (L) 10/28/2021 1350   NA 141 09/23/2020 0803   K 3.7 10/28/2021 1350   CL 101 10/28/2021 1350   CO2 20 (L) 10/28/2021 1350   GLUCOSE 246 (H) 10/28/2021 1350   BUN 13 10/28/2021 1350   BUN 11 09/23/2020 0803   CREATININE 1.01 (H) 10/28/2021 1350   CREATININE 0.55 12/29/2019 1004   CALCIUM 8.9 10/28/2021 1350   PROT 8.1 10/27/2021 2041   PROT 8.0 09/23/2020 0803   ALBUMIN 4.0 10/27/2021 2041   ALBUMIN 4.7 09/23/2020 0803   AST 21 10/27/2021 2041   ALT 17 10/27/2021 2041   ALKPHOS 94 10/27/2021 2041   BILITOT 1.8 (H) 10/27/2021 2041   BILITOT 0.3 09/23/2020 0803   GFRNONAA >60 10/28/2021 1350   GFRNONAA 109 12/29/2019 1004   GFRAA 127 12/29/2019 1004   Lipid Panel     Component Value Date/Time   CHOL 164 12/29/2019 1004   TRIG 75 12/29/2019 1004   HDL 49 (L) 12/29/2019 1004   CHOLHDL 3.3 12/29/2019 1004   LDLCALC 99 12/29/2019 1004     Assessment & Plan:   1. Uncontrolled type 2 diabetes mellitus with hyperglycemia, CKD   - Lindsey Vazquez has currently uncontrolled symptomatic type 2 DM since 51 years of age.  She presents with worsening glycemic profile.  Her AGP report shows 21% time in range, 12% level 1 hyperglycemia, 66% level 2 hyperglycemia.  She did not document any hypoglycemia.  Her recent point-of-care A1c was 12.5% increasing from 9.8%.    -She did not have  hypoglycemic episodes since last visit.   Recent labs reviewed, which also shows significant vitamin D deficiency.  -her diabetes is complicated by DKA, obesity, and sedentary life and she remains at a high risk for more acute and chronic complications which include CAD, CVA, CKD, retinopathy, and neuropathy. These are all discussed in detail with her.  - I have counseled her on diet management and weight loss, by adopting a carbohydrate restricted/protein rich diet. This patient has not engaged in lifestyle medicine.  She is in fact the best candidate for lifestyle medicine in light of the fact that she is losing control of diabetes despite recent bariatric surgery.  - she acknowledges that there is a room for improvement in her food and drink choices. - Suggestion is made for her to avoid simple carbohydrates  from her diet including Cakes, Sweet Desserts, Ice Cream, Soda (diet and regular), Sweet Tea, Candies, Chips, Cookies, Store Bought Juices, Alcohol , Artificial Sweeteners,  Coffee Creamer, and "Sugar-free" Products, Lemonade. This will help patient to have more stable blood glucose profile and potentially avoid unintended weight gain.  The following Lifestyle Medicine recommendations according to [place]  [facility]) were discussed and and offered to patient and she  agrees to start the journey:  A. Whole Foods, Plant-Based Nutrition comprising of fruits and vegetables, plant-based proteins, whole-grain carbohydrates was discussed in detail with the patient.   A list for source of those nutrients were also provided to the  patient.  Patient will use only water or unsweetened tea for hydration. B.  The need to stay away from risky substances including alcohol, smoking; obtaining 7 to 9 hours of restorative sleep, at least 150 minutes of moderate intensity exercise weekly, the importance of healthy social connections,  and stress management  techniques were discussed. C.  A full color page of  Calorie density of various food groups per pound showing examples of each food groups was provided to the patient.   - she is advised to stick to a routine mealtimes to eat 3 meals  a day and avoid unnecessary snacks ( to snack only to correct hypoglycemia).   - I have approached her with the following individualized plan to manage diabetes and patient agrees:  -Despite  her recent sleeve gastrectomy, apparently patient did not change any behavior.  Regaining weight loss she has achieved after her surgery.   -In light of her presentation with worsening glycemic profile, she will continue to need multiple daily injections of insulin.  However, this patient could not execute basal/bolus insulin format.    -She he was switched to premixed insulin Humalog 75/25 during her last visit.  I discussed and increased her dose to 80 units with breakfast and 80 units with supper  with supper for Premeal blood glucose readings above 90 mg per DL.   She is advised to stay off of metformin for now.  She is encouraged to continue to use her CGM continuously.  She may benefit from GLP-1 receptor agonist.  I discussed and prescribed Ozempic 1 mg subcutaneously weekly.  Side effects and precautions discussed with her.  - she is encouraged to call clinic for blood glucose levels less than 70 or above 200 mg /dl.   -Advised to stay away from SGLT2 inhibitors given her recent history of euglycemic DKA while taking Jardiance.   - Patient specific target  A1c;  LDL, HDL, Triglycerides were discussed in detail.  2) BP/HTN:  -Her blood pressure is  controlled to target.    she is currently not taking any blood pressure medications.    3) Lipids/HPL: Recent lipid panel showed worsening LDL at 99.  She is advised to continue atorvastatin 10 mg p.o. nightly.          Side effects and precautions discussed with her.  4) vitamin  D deficiency- 25 hydroxy vitamin D remains low despite a course of treatment with vitamin D3 5000 units for 90 days .she is approached for retreatment with vitamin D3 5000 units daily for the next 90 days.    5) weight management: Her BMI is 46.25-she is regaining weight status post sleeve gastrectomy on August 11, 2021.    6) hypercalcemia: Associated with high PTH.  Her 24-hour urine calcium is not elevated-210.  Still possible for mild early primary hyperparathyroidism.  She will not need acute intervention right now.  She will have repeat PTH /calcium during her subsequent labs.     6) Chronic Care/Health Maintenance:  -she  is on Statin medications and  is encouraged to initiate and continue to follow up with Ophthalmology, Dentist, nephrology, podiatrist at least yearly or according to recommendations, and advised to   stay away from smoking. I have recommended yearly flu vaccine and pneumonia vaccine at least every 5 years; moderate intensity exercise for up to 150 minutes weekly; and  sleep for at least 7 hours a day.  - I advised patient to maintain close follow up with Renee Rival, NP for primary care needs.   I spent 28 minutes in the care of the patient today including review of labs from [place], Lipids, Thyroid Function, Hematology (current and previous including abstractions from other facilities); face-to-face time discussing  her blood glucose readings/logs, discussing hypoglycemia and hyperglycemia episodes and symptoms, medications  doses, her options of short and long term treatment based on the latest standards of care / guidelines;  discussion about incorporating lifestyle medicine;  and  documenting the encounter. Risk reduction counseling performed per USPSTF guidelines to reduce  obesity and cardiovascular risk factors.     Please refer to Patient Instructions for Blood Glucose Monitoring and Insulin/Medications Dosing Guide"  in media tab for additional information. Please  also refer to " Patient Self Inventory" in the Media  tab for reviewed elements of pertinent patient history.  Lindsey Vazquez participated in the discussions, expressed understanding, and voiced agreement with the above plans.  All questions were answered to her satisfaction. she is encouraged to contact clinic should she have any questions or concerns prior to her return visit.    Follow up plan: - Return in about 9 weeks (around 09/14/2022) for Bring Meter/CGM Device/Logs- A1c in Office.  Glade Lloyd, MD [facility] [address] [place], [place] [address] Phone: [phone]  Fax: [phone]    07/13/2022, 6:33 PM  This note was partially dictated with voice recognition software. Similar sounding words can be transcribed inadequately or may not  be corrected upon review.

## 2022-07-25 IMAGING — CT CT ABD-PELV W/ CM
2 of 5 series · 16 of 46 positions shown, 18 images · IV contrast (Omnipaque or Isovue)
Comparison: None.

CLINICAL DATA: Evaluate for bowel obstruction. Status post gastric
sleeve surgery.

EXAM:
CT ABDOMEN AND PELVIS WITH CONTRAST
TECHNIQUE: Multidetector CT imaging of the abdomen and pelvis was performed
using the standard protocol following bolus administration of
intravenous contrast.

[Series 2: axial st · axial · 0.80mm/px · z∈[+884,+1289]mm · 13 of 93 slices shown, 15 images]
[im 6/93  soft-tissue]
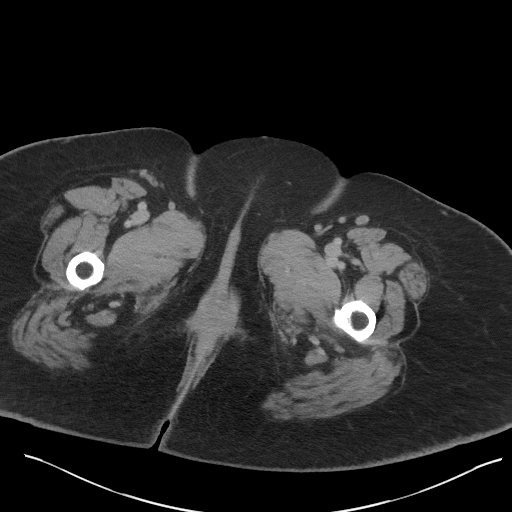
[im 6/93  bone]
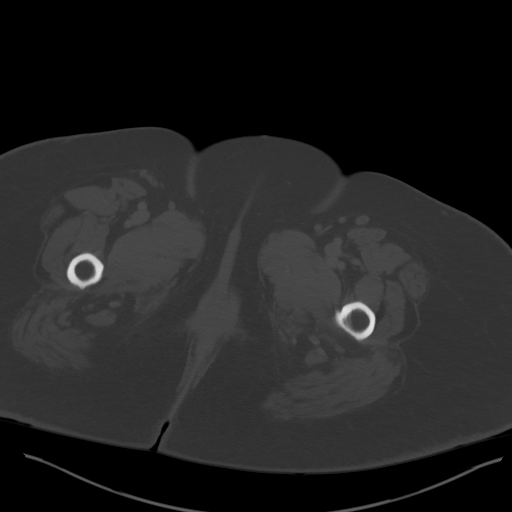
[im 11/93  soft-tissue]
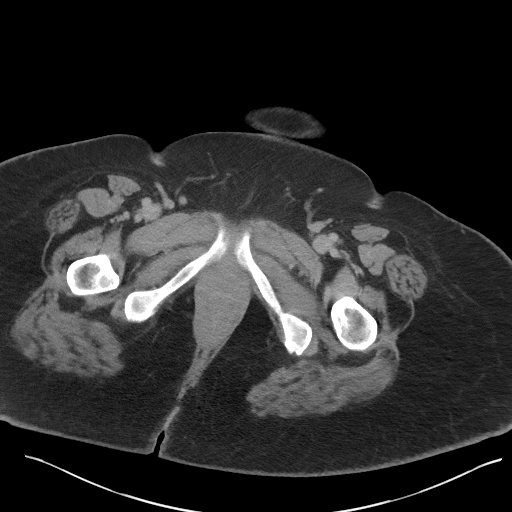
[im 21/93  soft-tissue]
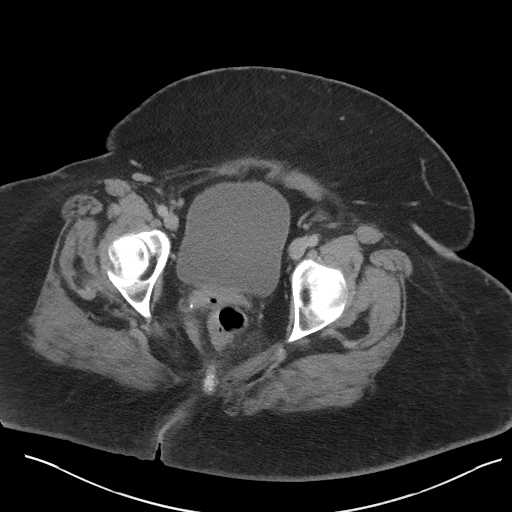
[im 26/93  soft-tissue]
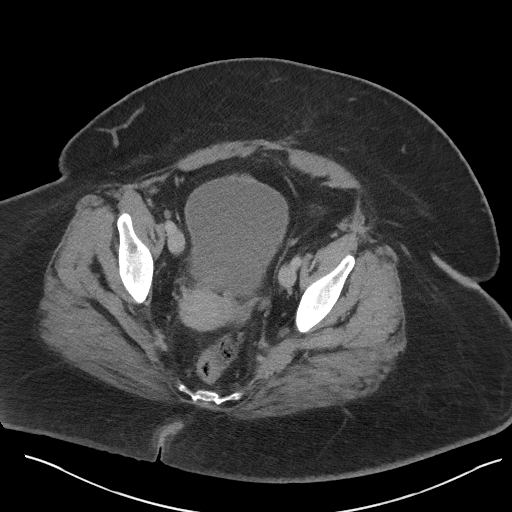
[im 31/93  soft-tissue]
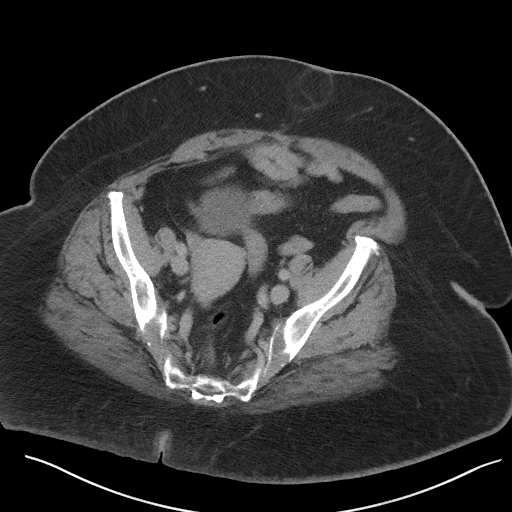
[im 41/93  soft-tissue]
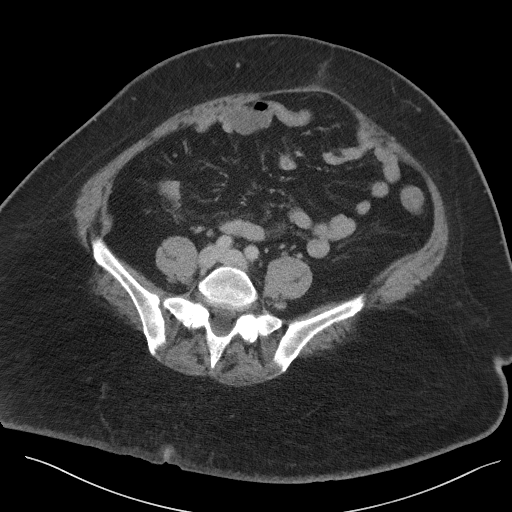
[im 47/93  soft-tissue]
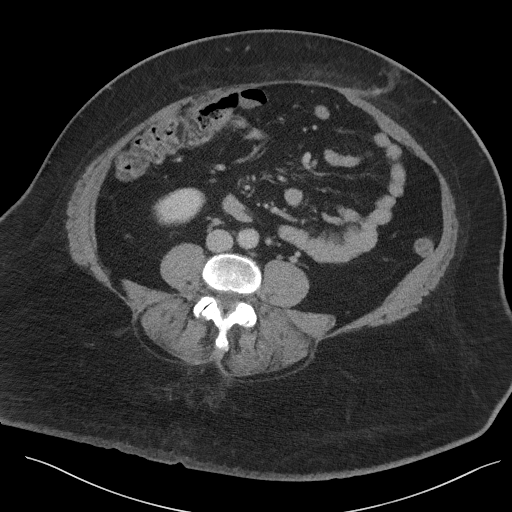
[im 52/93  soft-tissue]
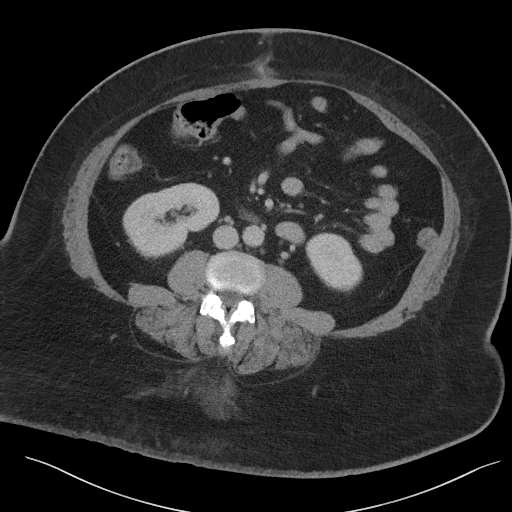
[im 62/93  soft-tissue]
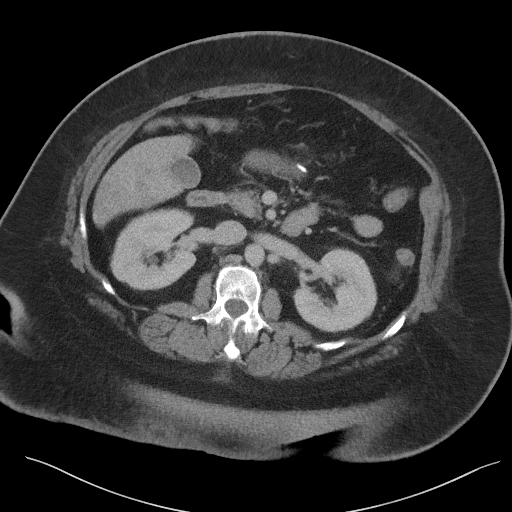
[im 62/93  bone]
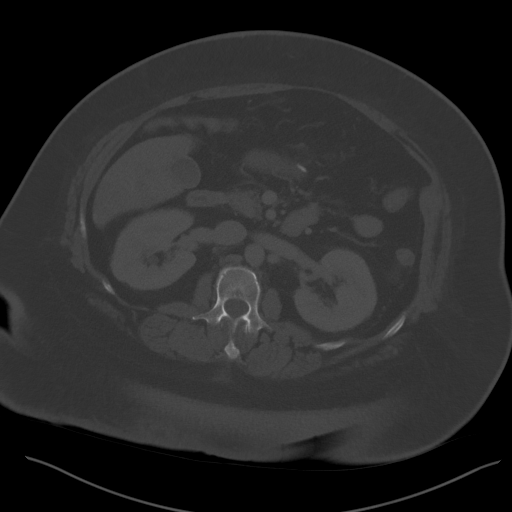
[im 67/93  soft-tissue]
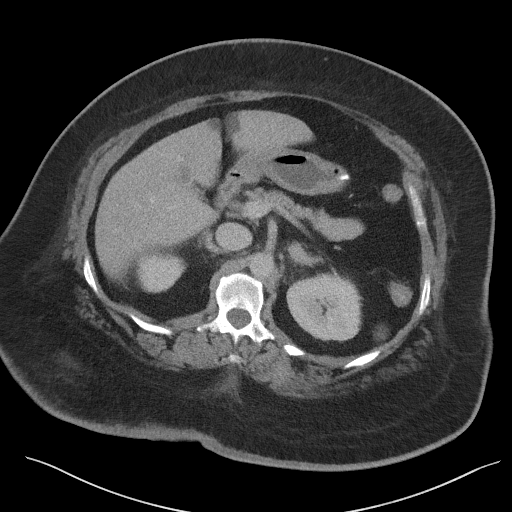
[im 72/93  soft-tissue]
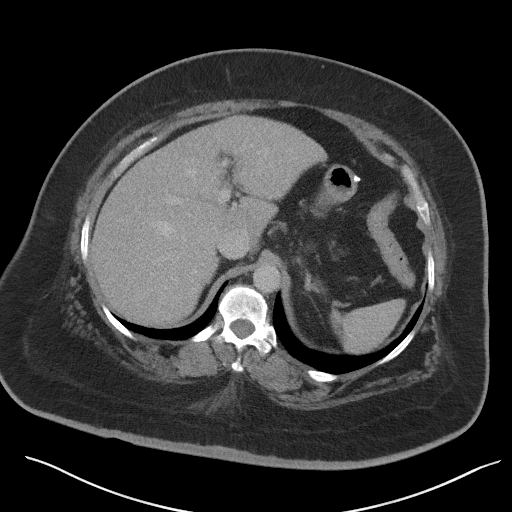
[im 82/93  soft-tissue]
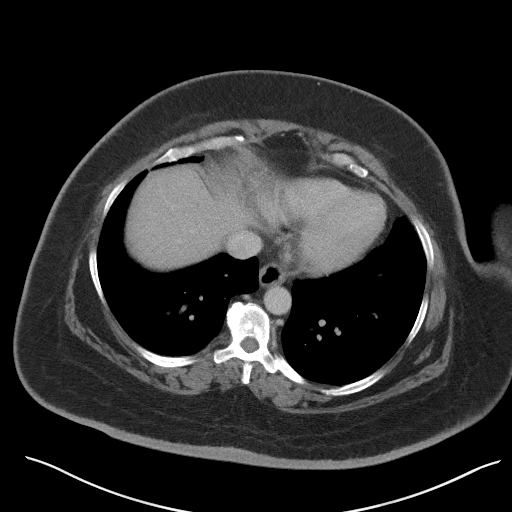
[im 87/93  soft-tissue]
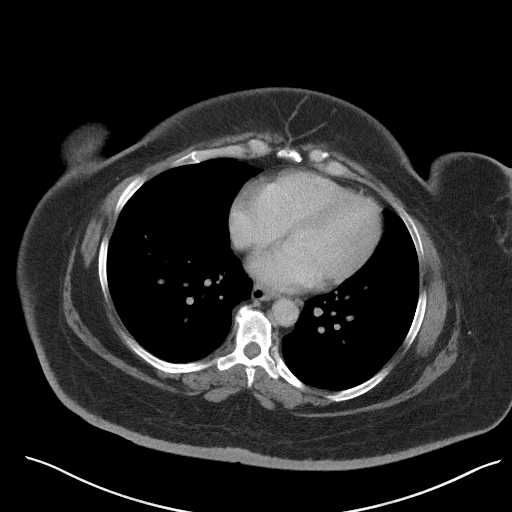

[Series 6: coronal st · coronal · 0.81mm/px · 3 of 117 slices shown]
[im 39/117  soft-tissue]
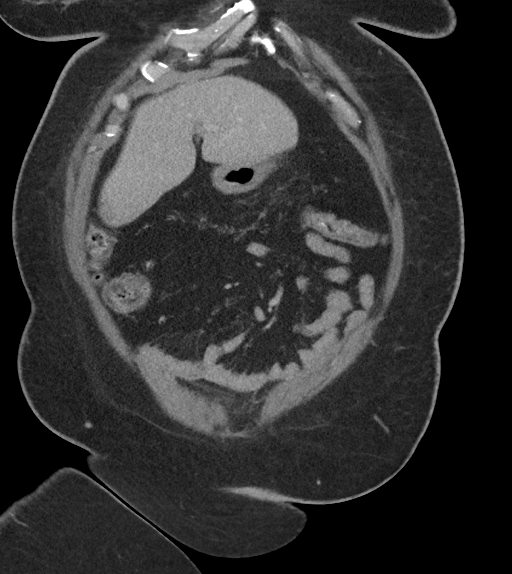
[im 52/117  soft-tissue]
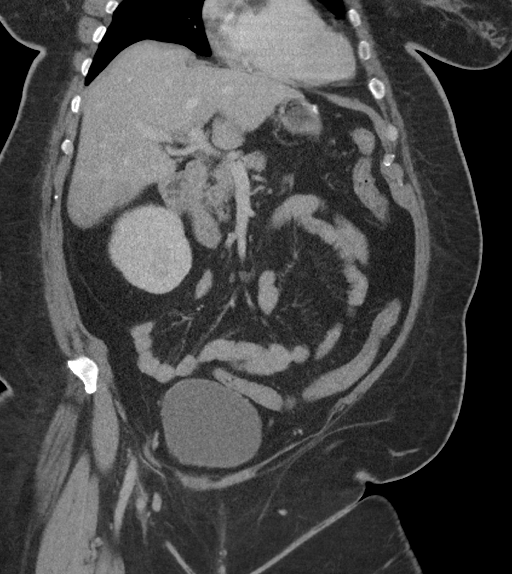
[im 65/117  soft-tissue]
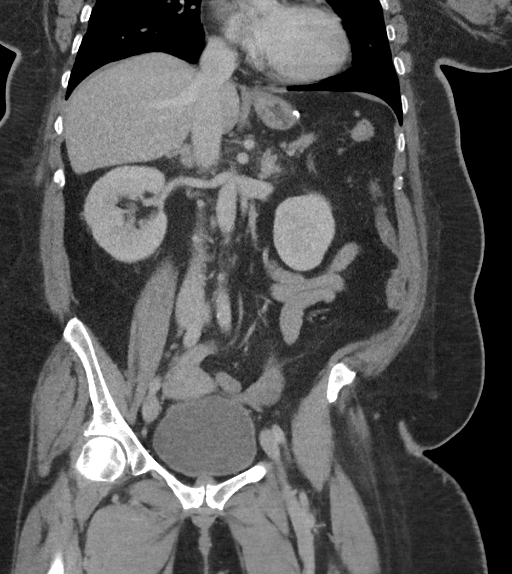

[16 of 46 positions shown; findings below may reference images not displayed]

RADIATION DOSE REDUCTION: This exam was performed according to the
departmental dose-optimization program which includes automated
exposure control, adjustment of the mA and/or kV according to
patient size and/or use of iterative reconstruction technique.

CONTRAST:  100mL OMNIPAQUE IOHEXOL 300 MG/ML  SOLN
FINDINGS: Lower chest: No acute abnormality.

Hepatobiliary: Focal fatty infiltration identified within the left
hepatic lobe along the falciform ligament. Mild heterogeneous
enhancement of the inferior right hepatic lobe may represent a
geographic area of focal fatty deposition. No discrete liver lesions
identified. Gallbladder appears within normal limits. No bile duct
dilatation.

Pancreas: Unremarkable. No pancreatic ductal dilatation or
surrounding inflammatory changes.

Spleen: Normal in size without focal abnormality.

Adrenals/Urinary Tract: Normal adrenal glands. No nephrolithiasis,
hydronephrosis, or mass. Urinary bladder is unremarkable.

Stomach/Bowel: Postoperative change from sleeve gastrectomy
identified. Stomach appears nondistended. No pathologic dilatation
of the large or small bowel loops to suggest bowel obstruction.
Colon appears diffusely decompressed with mild wall thickening which
may reflect incomplete distension. No pericolonic inflammatory
changes noted. The appendix is visualized and is within normal
limits, image [DATE].

Vascular/Lymphatic: Aortic atherosclerosis. No aneurysm. No
abdominopelvic adenopathy.

Reproductive: Uterus and adnexal structures are unremarkable.

Other: There is a fat containing umbilical hernia which measures
cm, image 72/7. Areas of postoperative change within the ventral
abdominal wall are identified in keeping with recent laparoscopic
gastric sleeve resection. No free fluid or fluid collections
identified. No signs of pneumoperitoneum.

Musculoskeletal: No acute or significant osseous findings.
IMPRESSION: 1. No evidence for bowel obstruction.
2. Postoperative change from recent laparoscopic gastric sleeve
resection.
3. Fat containing umbilical hernia.
4. Aortic Atherosclerosis ([id number]-BD2.2).

## 2022-08-10 ENCOUNTER — Telehealth: Payer: Self-pay | Admitting: "Endocrinology

## 2022-08-10 ENCOUNTER — Telehealth: Payer: Self-pay

## 2022-08-10 DIAGNOSIS — E1165 Type 2 diabetes mellitus with hyperglycemia: Secondary | ICD-10-CM

## 2022-08-10 NOTE — Telephone Encounter (Signed)
Pt said that [facility] is out of Ozempic '1mg'$  but has 2 mg in stock. She has a coupon card for 3 month supply. Can this be changed?

## 2022-08-10 NOTE — Telephone Encounter (Signed)
Tried to call pt but did not receive an answer and was unable to leave a message. 

## 2022-08-11 MED ORDER — OZEMPIC (2 MG/DOSE) 8 MG/3ML ~~LOC~~ SOPN: 2.0000 mg | PEN_INJECTOR | SUBCUTANEOUS | 0 refills | Status: DC

## 2022-08-11 NOTE — Telephone Encounter (Signed)
Tried to call pt but did not receive an answer and was unable to leave a message. 

## 2022-08-11 NOTE — Telephone Encounter (Signed)
Pt returned called after hours. Please call pt

## 2022-08-11 NOTE — Telephone Encounter (Signed)
Received message earlier this morning, sent to Dr.Nida, waiting on response.

## 2022-08-14 NOTE — Telephone Encounter (Signed)
Discussed with pt, understanding voiced.

## 2022-08-19 ENCOUNTER — Other Ambulatory Visit: Payer: Self-pay | Admitting: "Endocrinology

## 2022-09-14 ENCOUNTER — Encounter: Payer: Self-pay | Admitting: "Endocrinology

## 2022-09-14 ENCOUNTER — Ambulatory Visit: Payer: 59 | Admitting: "Endocrinology

## 2022-09-14 VITALS — BP 108/66 | HR 88 | Ht 61.0 in | Wt 230.2 lb

## 2022-09-14 DIAGNOSIS — I10 Essential (primary) hypertension: Secondary | ICD-10-CM | POA: Diagnosis not present

## 2022-09-14 DIAGNOSIS — E782 Mixed hyperlipidemia: Secondary | ICD-10-CM | POA: Diagnosis not present

## 2022-09-14 DIAGNOSIS — E1165 Type 2 diabetes mellitus with hyperglycemia: Secondary | ICD-10-CM

## 2022-09-14 LAB — POCT GLYCOSYLATED HEMOGLOBIN (HGB A1C): HbA1c, POC (controlled diabetic range): 11.4 % — AB (ref 0.0–7.0)

## 2022-09-14 MED ORDER — INSULIN LISPRO PROT & LISPRO (75-25 MIX) 100 UNIT/ML KWIKPEN: 100.0000 [IU] | PEN_INJECTOR | Freq: Two times a day (BID) | SUBCUTANEOUS | 3 refills | Status: DC

## 2022-09-14 NOTE — Patient Instructions (Signed)
                                     Advice for Weight Management  -For most of us the best way to lose weight is by diet management. Generally speaking, diet management means consuming less calories intentionally which over time brings about progressive weight loss.  This can be achieved more effectively by avoiding ultra processed carbohydrates, processed meats, unhealthy fats.    It is critically important to know your numbers: how much calorie you are consuming and how much calorie you need. More importantly, our carbohydrates sources should be unprocessed naturally occurring  complex starch food items.  It is always important to balance nutrition also by  appropriate intake of proteins (mainly plant-based), healthy fats/oils, plenty of fruits and vegetables.   -The American College of Lifestyle Medicine (ACL M) recommends nutrition derived mostly from Whole Food, Plant Predominant Sources example an apple instead of applesauce or apple pie. Eat Plenty of vegetables, Mushrooms, fruits, Legumes, Whole Grains, Nuts, seeds in lieu of processed meats, processed snacks/pastries red meat, poultry, eggs.  Use only water or unsweetened tea for hydration.  The College also recommends the need to stay away from risky substances including alcohol, smoking; obtaining 7-9 hours of restorative sleep, at least 150 minutes of moderate intensity exercise weekly, importance of healthy social connections, and being mindful of stress and seek help when it is overwhelming.    -Sticking to a routine mealtime to eat 3 meals a day and avoiding unnecessary snacks is shown to have a big role in weight control. Under normal circumstances, the only time we burn stored energy is when we are hungry, so allow  some hunger to take place- hunger means no food between appropriate meal times, only water.  It is not advisable to starve.   -It is better to avoid simple carbohydrates including:  Cakes, Sweet Desserts, Ice Cream, Soda (diet and regular), Sweet Tea, Candies, Chips, Cookies, Store Bought Juices, Alcohol in Excess of  1-2 drinks a day, Lemonade,  Artificial Sweeteners, Doughnuts, Coffee Creamers, "Sugar-free" Products, etc, etc.  This is not a complete list.....    -Consulting with certified diabetes educators is proven to provide you with the most accurate and current information on diet.  Also, you may be  interested in discussing diet options/exchanges , we can schedule a visit with Lindsey Vazquez, RDN, CDE for individualized nutrition education.  -Exercise: If you are able: 30 -60 minutes a day ,4 days a week, or 150 minutes of moderate intensity exercise weekly.    The longer the better if tolerated.  Combine stretch, strength, and aerobic activities.  If you were told in the past that you have high risk for cardiovascular diseases, or if you are currently symptomatic, you may seek evaluation by your heart doctor prior to initiating moderate to intense exercise programs.                                  Additional Care Considerations for Diabetes/Prediabetes   -Diabetes  is a chronic disease.  The most important care consideration is regular follow-up with your diabetes care provider with the goal being avoiding or delaying its complications and to take advantage of advances in medications and technology.  If appropriate actions are taken early enough, type 2 diabetes can even be   reversed.  Seek information from the right source.  - Whole Food, Plant Predominant Nutrition is highly recommended: Eat Plenty of vegetables, Mushrooms, fruits, Legumes, Whole Grains, Nuts, seeds in lieu of processed meats, processed snacks/pastries red meat, poultry, eggs as recommended by American College of  Lifestyle Medicine (ACLM).  -Type 2 diabetes is known to coexist with other important comorbidities such as high blood pressure and high cholesterol.  It is critical to control not only the  diabetes but also the high blood pressure and high cholesterol to minimize and delay the risk of complications including coronary artery disease, stroke, amputations, blindness, etc.  The good news is that this diet recommendation for type 2 diabetes is also very helpful for managing high cholesterol and high blood blood pressure.  - Studies showed that people with diabetes will benefit from a class of medications known as ACE inhibitors and statins.  Unless there are specific reasons not to be on these medications, the standard of care is to consider getting one from these groups of medications at an optimal doses.  These medications are generally considered safe and proven to help protect the heart and the kidneys.    - People with diabetes are encouraged to initiate and maintain regular follow-up with eye doctors, foot doctors, dentists , and if necessary heart and kidney doctors.     - It is highly recommended that people with diabetes quit smoking or stay away from smoking, and get yearly  flu vaccine and pneumonia vaccine at least every 5 years.  See above for additional recommendations on exercise, sleep, stress management , and healthy social connections.      

## 2022-09-14 NOTE — Progress Notes (Signed)
09/14/2022, 5:54 PM                                                     Endocrinology follow-up note   Subjective:    Patient ID: Lindsey Vazquez, female    DOB: May 05, 1971.  Lindsey Vazquez is being seen in follow-up for management of currently uncontrolled symptomatic type 2 diabetes, hyperlipidemia, vitamin D deficiency.  PCP:   Renee Rival, NP.   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   Social History   Socioeconomic History   Marital status: Married    Spouse name: Not on file   Number of children: Not on file   Years of education: Not on file   Highest education level: Not on file  Occupational History   Not on file  Tobacco Use   Smoking status: Former    Types: Cigarettes    Quit date: 07/25/2015    Years since quitting: 7.1   Smokeless tobacco: Never  Vaping Use   Vaping Use: Former  Substance and Sexual Activity   Alcohol use: No    Comment: socially   Drug use: No   Sexual activity: Yes  Other Topics Concern   Not on file  Social History Narrative   Not on file   Social Determinants of Health   Financial Resource Strain: Not on file  Food Insecurity: Not on file  Transportation Needs: Not on file  Physical Activity: Not on file  Stress: Not on file  Social Connections: Not on file   Outpatient Encounter Medications as of 09/14/2022  Medication Sig   acetaminophen (TYLENOL) 500 MG tablet Take 1 tablet (500 mg total) by mouth every 6 (six) hours as needed for mild pain, fever or headache.   aspirin 81 MG EC tablet Take 1 tablet by mouth daily.   atorvastatin (LIPITOR) 20 MG tablet Take 4 tablets (80 mg total) by mouth daily.   Continuous Blood Gluc Receiver (FREESTYLE  LIBRE 2 READER) DEVI As directed   Continuous Blood Gluc Sensor (FREESTYLE LIBRE 2 SENSOR) MISC CHANGE EVERY 14 DAYS.   docusate sodium (COLACE) 100 MG capsule Take 1 capsule (100 mg total) by mouth 2 (two) times daily as needed for mild constipation.   Insulin Lispro Prot & Lispro (HUMALOG MIX 75/25 KWIKPEN) (75-25) 100 UNIT/ML Kwikpen Inject 100 Units into the skin 2 (two) times daily before a meal.   magnesium oxide (MAG-OX) 400 (240 Mg) MG tablet Take 1 tablet by mouth daily.   metoprolol succinate (TOPROL-XL) 100 MG 24 hr tablet Take 50 mg by mouth daily.   Multiple Vitamin (MULTIVITAMIN) tablet Take 1 tablet by mouth daily.   ondansetron (ZOFRAN) 4 MG tablet Take 1 tablet (4 mg total) by mouth daily as needed for nausea or vomiting.   pantoprazole (PROTONIX) 40 MG tablet Take 1 tablet (40 mg total) by mouth 2 (two) times daily.   promethazine (PHENERGAN) 25 MG tablet Take 1 tablet (25 mg total) by mouth every 6 (six)  hours as needed for nausea or vomiting.   Semaglutide, 2 MG/DOSE, (OZEMPIC, 2 MG/DOSE,) 8 MG/3ML SOPN Inject 2 mg into the skin once a week.   telmisartan (MICARDIS) 20 MG tablet Take 0.5 tablets by mouth daily.   [DISCONTINUED] Insulin Lispro Prot & Lispro (HUMALOG MIX 75/25 KWIKPEN) (75-25) 100 UNIT/ML Kwikpen Inject 80 Units into the skin 2 (two) times daily before a meal.   No facility-administered encounter medications on file as of 09/14/2022.    ALLERGIES: Allergies  Allergen Reactions   Jardiance [Empagliflozin]    Metformin And Related Nausea And Vomiting    VACCINATION STATUS:  There is no immunization history on file for this patient.  Diabetes She presents for her follow-up diabetic visit. She has type 2 diabetes mellitus. Onset time: Diagnosed at approximate age of 52 years, after episode of gestational diabetes. Her disease course has been improving. There are no hypoglycemic associated symptoms. Pertinent negatives for hypoglycemia include no confusion,  headaches, pallor or seizures. Associated symptoms include foot paresthesias, polydipsia and polyuria. Pertinent negatives for diabetes include no chest pain and no fatigue. There are no hypoglycemic complications. Symptoms are worsening (Since her last visit, she was hospitalized for nausea/vomiting and was diagnosed with diabetes ketoacidosis.). Diabetic complications include nephropathy. Risk factors for coronary artery disease include diabetes mellitus, dyslipidemia, hypertension, family history, obesity, sedentary lifestyle and tobacco exposure. Current diabetic treatment includes insulin injections. Her weight is decreasing steadily. She is following a generally unhealthy diet. When asked about meal planning, she reported none. She rarely participates in exercise. Her home blood glucose trend is decreasing steadily. Her breakfast blood glucose range is generally >200 mg/dl. Her lunch blood glucose range is generally >200 mg/dl. Her dinner blood glucose range is generally >200 mg/dl. Her bedtime blood glucose range is generally >200 mg/dl. Her overall blood glucose range is >200 mg/dl. (She presents with slight improvement in her glycemic profile.  Her AGP report shows 24% time in range, 20% level 1 hyperglycemia, 56% level 2 hyperglycemia.  Patient did not call for hyperglycemia.  Her point-of-care A1c is 11.4% improving from 12.5%.    ) An ACE inhibitor/angiotensin II receptor blocker is not being taken.  Hyperlipidemia This is a chronic problem. The current episode started more than 1 year ago. The problem is uncontrolled. Exacerbating diseases include diabetes and obesity. Pertinent negatives include no chest pain, myalgias or shortness of breath. Current antihyperlipidemic treatment includes statins. Risk factors for coronary artery disease include dyslipidemia, diabetes mellitus, hypertension, family history, obesity and a sedentary lifestyle.  Hypertension This is a chronic problem. The current  episode started more than 1 year ago. Pertinent negatives include no chest pain, headaches, palpitations or shortness of breath. Risk factors for coronary artery disease include diabetes mellitus, dyslipidemia, sedentary lifestyle, smoking/tobacco exposure and obesity.    Review of systems  Constitutional: + Minimally fluctuating body weight,  current  Body mass index is 43.5 kg/m. , no fatigue, no subjective hyperthermia, no subjective hypothermia   Objective:    BP 108/66   Pulse 88   Ht 5' 1"$  (1.549 m)   Wt 230 lb 3.2 oz (104.4 kg)   BMI 43.50 kg/m   Wt Readings from Last 3 Encounters:  09/14/22 230 lb 3.2 oz (104.4 kg)  07/13/22 244 lb 12.8 oz (111 kg)  06/20/22 245 lb 3.2 oz (111.2 kg)      Physical Exam- Limited  Constitutional:  Body mass index is 43.5 kg/m. , not in acute distress, normal state of  mind Abdomen: Skin exam is not convincing for the amount of insulin she is given to inject.   CMP     Component Value Date/Time   NA 134 (L) 10/28/2021 1350   NA 141 09/23/2020 0803   K 3.7 10/28/2021 1350   CL 101 10/28/2021 1350   CO2 20 (L) 10/28/2021 1350   GLUCOSE 246 (H) 10/28/2021 1350   BUN 13 10/28/2021 1350   BUN 11 09/23/2020 0803   CREATININE 1.01 (H) 10/28/2021 1350   CREATININE 0.55 12/29/2019 1004   CALCIUM 8.9 10/28/2021 1350   PROT 8.1 10/27/2021 2041   PROT 8.0 09/23/2020 0803   ALBUMIN 4.0 10/27/2021 2041   ALBUMIN 4.7 09/23/2020 0803   AST 21 10/27/2021 2041   ALT 17 10/27/2021 2041   ALKPHOS 94 10/27/2021 2041   BILITOT 1.8 (H) 10/27/2021 2041   BILITOT 0.3 09/23/2020 0803   GFRNONAA >60 10/28/2021 1350   GFRNONAA 109 12/29/2019 1004   GFRAA 127 12/29/2019 1004   Lipid Panel     Component Value Date/Time   CHOL 164 12/29/2019 1004   TRIG 75 12/29/2019 1004   HDL 49 (L) 12/29/2019 1004   CHOLHDL 3.3 12/29/2019 1004   LDLCALC 99 12/29/2019 1004     Assessment & Plan:   1. Uncontrolled type 2 diabetes mellitus with  hyperglycemia, CKD   - Lindsey Vazquez has currently uncontrolled symptomatic type 2 DM since 52 years of age.  She presents with slight improvement in her glycemic profile.  Her AGP report shows 24% time in range, 20% level 1 hyperglycemia, 56% level 2 hyperglycemia.  Patient did not call for hyperglycemia.  Her point-of-care A1c is 11.4% improving from 12.5%.    -She did not have  hypoglycemic episodes since last visit.   Recent labs reviewed, which also shows significant vitamin D deficiency.  -her diabetes is complicated by DKA, obesity, and sedentary life and she remains at a high risk for more acute and chronic complications which include CAD, CVA, CKD, retinopathy, and neuropathy. These are all discussed in detail with her.  - I have counseled her on diet management and weight loss, by adopting a carbohydrate restricted/protein rich diet. This patient has not engaged in lifestyle medicine.  She is in fact the best candidate for lifestyle medicine in light of the fact that she is losing control of diabetes despite recent bariatric surgery.  - she acknowledges that there is a room for improvement in her food and drink choices. - Suggestion is made for her to avoid simple carbohydrates  from her diet including Cakes, Sweet Desserts, Ice Cream, Soda (diet and regular), Sweet Tea, Candies, Chips, Cookies, Store Bought Juices, Alcohol , Artificial Sweeteners,  Coffee Creamer, and "Sugar-free" Products, Lemonade. This will help patient to have more stable blood glucose profile and potentially avoid unintended weight gain.  The following Lifestyle Medicine recommendations according to [facility]) were discussed and and offered to patient and she  agrees to start the journey:  A. Whole Foods, Plant-Based Nutrition comprising of fruits and vegetables, plant-based proteins, whole-grain carbohydrates was discussed in detail with the patient.   A list for source of  those nutrients were also provided to the patient.  Patient will use only water or unsweetened tea for hydration. B.  The need to stay away from risky substances including alcohol, smoking; obtaining 7 to 9 hours of restorative sleep, at least 150 minutes of moderate intensity exercise weekly, the importance  of healthy social connections,  and stress management techniques were discussed. C.  A full color page of  Calorie density of various food groups per pound showing examples of each food groups was provided to the patient.   - she is advised to stick to a routine mealtimes to eat 3 meals  a day and avoid unnecessary snacks ( to snack only to correct hypoglycemia).   - I have approached her with the following individualized plan to manage diabetes and patient agrees:    -In light of her presentation with significantly above target glycemic profile she will continue to need multiple daily injections of insulin.  However, this patient could not execute basal/bolus insulin format.    -For simplicity reasons, she  was switched to premixed insulin Humalog 75/25 during her last visit.   I discussed and increase her dose to 100 units with breakfast and 100 units with supper for Premeal blood glucose readings above 90 mg per DL.   She is advised to stay off of metformin for now.  She is encouraged to continue to use her CGM continuously.  She is benefiting from GLP-1 receptor agonist.  She is advised to continue Ozempic 2 mg subcutaneously weekly.   Side effects and precautions discussed with her.  - she is encouraged to call clinic for blood glucose levels less than 70 or above 200 mg /dl.   -Advised to stay away from SGLT2 inhibitors given her recent history of euglycemic DKA while taking Jardiance.   - Patient specific target  A1c;  LDL, HDL, Triglycerides were discussed in detail.  2) BP/HTN:  -Her blood pressure is controlled to target.    she is currently not taking any blood pressure  medications.    3) Lipids/HPL: Recent lipid panel showed worsening LDL at 99.  She is advised to continue atorvastatin 10 mg p.o. nightly.          Side effects and precautions discussed with her.  4) vitamin  D deficiency- 25 hydroxy vitamin D remains low despite a course of treatment with vitamin D3 5000 units for 90 days .she is approached for retreatment with vitamin D3 5000 units daily for the next 90 days.    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    6) hypercalcemia: Associated with high PTH.  Her 24-hour urine calcium is not elevated-210.  Still possible for mild early primary hyperparathyroidism.  She will not need acute intervention right now.  She will have repeat PTH /calcium during her subsequent labs.     6) Chronic Care/Health Maintenance:  -she  is on Statin medications and  is encouraged to initiate and continue to follow up with Ophthalmology, Dentist, nephrology, podiatrist at least yearly or according to recommendations, and advised to   stay away from smoking. I have recommended yearly flu vaccine and pneumonia vaccine at least every 5 years; moderate intensity exercise for up to 150 minutes weekly; and  sleep for at least 7 hours a day.  - I advised patient to maintain close follow up with Renee Rival, NP for primary care needs.  I spent  28  minutes in the care of the patient today including review of labs from [place], Lipids, Thyroid Function, Hematology (current and previous including abstractions from other facilities); face-to-face time discussing  her blood glucose readings/logs, discussing hypoglycemia and hyperglycemia episodes and symptoms, medications doses,  her options of short and long term treatment based on the latest standards of care / guidelines;  discussion about incorporating lifestyle medicine;  and documenting the encounter. Risk reduction  counseling performed per USPSTF guidelines to reduce  obesity and cardiovascular risk factors.     Please refer to Patient Instructions for Blood Glucose Monitoring and Insulin/Medications Dosing Guide"  in media tab for additional information. Please  also refer to " Patient Self Inventory" in the Media  tab for reviewed elements of pertinent patient history.  Lindsey Vazquez participated in the discussions, expressed understanding, and voiced agreement with the above plans.  All questions were answered to her satisfaction. she is encouraged to contact clinic should she have any questions or concerns prior to her return visit.   Follow up plan: - Return in about 3 months (around 12/13/2022) for F/U with Pre-visit Labs, Meter/CGM/Logs, A1c here.  Glade Lloyd, MD [facility] [address] [place], [place] [address] Phone: [phone]  Fax: [phone]    09/14/2022, 5:54 PM  This note was partially dictated with voice recognition software. Similar sounding words can be transcribed inadequately or may not  be corrected upon review.

## 2022-11-23 ENCOUNTER — Ambulatory Visit (INDEPENDENT_AMBULATORY_CARE_PROVIDER_SITE_OTHER): Payer: 59

## 2022-11-23 ENCOUNTER — Emergency Department (HOSPITAL_COMMUNITY): Payer: 59

## 2022-11-23 ENCOUNTER — Ambulatory Visit
Admission: EM | Admit: 2022-11-23 | Discharge: 2022-11-23 | Disposition: A | Discharge status: Home / Self Care | Payer: 59 | Attending: Nurse Practitioner | Admitting: Nurse Practitioner

## 2022-11-23 ENCOUNTER — Encounter (HOSPITAL_COMMUNITY): Payer: Self-pay | Admitting: *Deleted

## 2022-11-23 ENCOUNTER — Other Ambulatory Visit: Payer: Self-pay

## 2022-11-23 ENCOUNTER — Inpatient Hospital Stay (HOSPITAL_COMMUNITY)
Admission: EM | Admit: 2022-11-23 | Discharge: 2022-11-26 | DRG: 175 | Disposition: A | Discharge status: Home / Self Care | Payer: 59 | Attending: Internal Medicine | Admitting: Internal Medicine

## 2022-11-23 ENCOUNTER — Other Ambulatory Visit: Payer: Self-pay | Admitting: "Endocrinology

## 2022-11-23 DIAGNOSIS — Z888 Allergy status to other drugs, medicaments and biological substances status: Secondary | ICD-10-CM

## 2022-11-23 DIAGNOSIS — R0602 Shortness of breath: Secondary | ICD-10-CM

## 2022-11-23 DIAGNOSIS — G4733 Obstructive sleep apnea (adult) (pediatric): Secondary | ICD-10-CM | POA: Diagnosis present

## 2022-11-23 DIAGNOSIS — I2699 Other pulmonary embolism without acute cor pulmonale: Secondary | ICD-10-CM | POA: Diagnosis not present

## 2022-11-23 DIAGNOSIS — Z87891 Personal history of nicotine dependence: Secondary | ICD-10-CM | POA: Diagnosis not present

## 2022-11-23 DIAGNOSIS — I480 Paroxysmal atrial fibrillation: Secondary | ICD-10-CM | POA: Diagnosis present

## 2022-11-23 DIAGNOSIS — R0609 Other forms of dyspnea: Secondary | ICD-10-CM

## 2022-11-23 DIAGNOSIS — I10 Essential (primary) hypertension: Secondary | ICD-10-CM | POA: Diagnosis present

## 2022-11-23 DIAGNOSIS — E1169 Type 2 diabetes mellitus with other specified complication: Secondary | ICD-10-CM

## 2022-11-23 DIAGNOSIS — Z7982 Long term (current) use of aspirin: Secondary | ICD-10-CM | POA: Diagnosis not present

## 2022-11-23 DIAGNOSIS — E1110 Type 2 diabetes mellitus with ketoacidosis without coma: Secondary | ICD-10-CM | POA: Diagnosis present

## 2022-11-23 DIAGNOSIS — Z9884 Bariatric surgery status: Secondary | ICD-10-CM | POA: Diagnosis not present

## 2022-11-23 DIAGNOSIS — Z79899 Other long term (current) drug therapy: Secondary | ICD-10-CM | POA: Diagnosis not present

## 2022-11-23 DIAGNOSIS — Z7985 Long-term (current) use of injectable non-insulin antidiabetic drugs: Secondary | ICD-10-CM | POA: Diagnosis not present

## 2022-11-23 DIAGNOSIS — Z794 Long term (current) use of insulin: Secondary | ICD-10-CM | POA: Diagnosis not present

## 2022-11-23 DIAGNOSIS — E1165 Type 2 diabetes mellitus with hyperglycemia: Secondary | ICD-10-CM

## 2022-11-23 DIAGNOSIS — Z1152 Encounter for screening for COVID-19: Secondary | ICD-10-CM | POA: Diagnosis not present

## 2022-11-23 DIAGNOSIS — J9601 Acute respiratory failure with hypoxia: Secondary | ICD-10-CM | POA: Diagnosis present

## 2022-11-23 DIAGNOSIS — E785 Hyperlipidemia, unspecified: Secondary | ICD-10-CM | POA: Diagnosis present

## 2022-11-23 DIAGNOSIS — Z6841 Body Mass Index (BMI) 40.0 and over, adult: Secondary | ICD-10-CM

## 2022-11-23 DIAGNOSIS — J189 Pneumonia, unspecified organism: Secondary | ICD-10-CM | POA: Diagnosis present

## 2022-11-23 DIAGNOSIS — I2694 Multiple subsegmental pulmonary emboli without acute cor pulmonale: Secondary | ICD-10-CM | POA: Diagnosis not present

## 2022-11-23 DIAGNOSIS — Z86711 Personal history of pulmonary embolism: Secondary | ICD-10-CM | POA: Diagnosis not present

## 2022-11-23 DIAGNOSIS — R0902 Hypoxemia: Secondary | ICD-10-CM

## 2022-11-23 DIAGNOSIS — I2609 Other pulmonary embolism with acute cor pulmonale: Secondary | ICD-10-CM | POA: Diagnosis not present

## 2022-11-23 LAB — CBC WITH DIFFERENTIAL/PLATELET
Abs Immature Granulocytes: 0.2 10*3/uL — ABNORMAL HIGH (ref 0.00–0.07)
Basophils Absolute: 0.1 10*3/uL (ref 0.0–0.1)
Basophils Relative: 1 %
Eosinophils Absolute: 0.5 10*3/uL (ref 0.0–0.5)
Eosinophils Relative: 4 %
HCT: 43 % (ref 36.0–46.0)
Hemoglobin: 14 g/dL (ref 12.0–15.0)
Lymphocytes Relative: 45 %
Lymphs Abs: 5.5 10*3/uL — ABNORMAL HIGH (ref 0.7–4.0)
MCH: 29.5 pg (ref 26.0–34.0)
MCHC: 32.6 g/dL (ref 30.0–36.0)
MCV: 90.7 fL (ref 80.0–100.0)
Metamyelocytes Relative: 2 %
Monocytes Absolute: 0.4 10*3/uL (ref 0.1–1.0)
Monocytes Relative: 3 %
Neutro Abs: 5.5 10*3/uL (ref 1.7–7.7)
Neutrophils Relative %: 45 %
Platelets: 223 10*3/uL (ref 150–400)
RBC: 4.74 MIL/uL (ref 3.87–5.11)
RDW: 14.6 % (ref 11.5–15.5)
WBC: 12.3 10*3/uL — ABNORMAL HIGH (ref 4.0–10.5)
nRBC: 0 % (ref 0.0–0.2)

## 2022-11-23 LAB — BASIC METABOLIC PANEL
Anion gap: 12 (ref 5–15)
BUN: 20 mg/dL (ref 6–20)
CO2: 24 mmol/L (ref 22–32)
Calcium: 8.9 mg/dL (ref 8.9–10.3)
Chloride: 99 mmol/L (ref 98–111)
Creatinine, Ser: 1.26 mg/dL — ABNORMAL HIGH (ref 0.44–1.00)
GFR, Estimated: 51 mL/min — ABNORMAL LOW (ref >60)
Glucose, Bld: 133 mg/dL — ABNORMAL HIGH (ref 70–99)
Potassium: 3.6 mmol/L (ref 3.5–5.1)
Sodium: 135 mmol/L (ref 135–145)

## 2022-11-23 LAB — TROPONIN I (HIGH SENSITIVITY): Troponin I (High Sensitivity): 17 ng/L (ref <18)

## 2022-11-23 LAB — CBG MONITORING, ED: Glucose-Capillary: 93 mg/dL (ref 70–99)

## 2022-11-23 LAB — BLOOD GAS, VENOUS
Acid-Base Excess: 3.2 mmol/L — ABNORMAL HIGH (ref 0.0–2.0)
Bicarbonate: 29.9 mmol/L — ABNORMAL HIGH (ref 20.0–28.0)
Drawn by: 442
O2 Saturation: 29.8 %
Patient temperature: 36.5
pCO2, Ven: 52 mmHg (ref 44–60)
pH, Ven: 7.37 (ref 7.25–7.43)
pO2, Ven: <31 mmHg — CL (ref 32–45)

## 2022-11-23 LAB — POCT FASTING CBG KUC MANUAL ENTRY: POCT Glucose (KUC): 301 mg/dL — AB (ref 70–99)

## 2022-11-23 LAB — D-DIMER, QUANTITATIVE: D-Dimer, Quant: 16.81 ug/mL-FEU — ABNORMAL HIGH (ref 0.00–0.50)

## 2022-11-23 LAB — SARS CORONAVIRUS 2 BY RT PCR: SARS Coronavirus 2 by RT PCR: NEGATIVE

## 2022-11-23 LAB — BRAIN NATRIURETIC PEPTIDE: B Natriuretic Peptide: 77 pg/mL (ref 0.0–100.0)

## 2022-11-23 MED ORDER — POTASSIUM CHLORIDE CRYS ER 20 MEQ PO TBCR
40.0000 meq | EXTENDED_RELEASE_TABLET | Freq: Once | ORAL | Status: AC
  Administered 2022-11-23: 40 meq via ORAL
  Filled 2022-11-23: qty 2

## 2022-11-23 MED ORDER — ACETAMINOPHEN 650 MG RE SUPP: 650.0000 mg | Freq: Four times a day (QID) | RECTAL | Status: DC | PRN

## 2022-11-23 MED ORDER — IOHEXOL 350 MG/ML SOLN
100.0000 mL | Freq: Once | INTRAVENOUS | Status: AC | PRN
  Administered 2022-11-23: 100 mL via INTRAVENOUS

## 2022-11-23 MED ORDER — ACETAMINOPHEN 325 MG PO TABS
650.0000 mg | ORAL_TABLET | Freq: Four times a day (QID) | ORAL | Status: DC | PRN
  Administered 2022-11-23 – 2022-11-26 (×3): 650 mg via ORAL
  Filled 2022-11-23 (×3): qty 2

## 2022-11-23 MED ORDER — HEPARIN (PORCINE) 25000 UT/250ML-% IV SOLN
1200.0000 [IU]/h | INTRAVENOUS | Status: DC
  Administered 2022-11-23: 1200 [IU]/h via INTRAVENOUS
  Filled 2022-11-23: qty 250

## 2022-11-23 MED ORDER — SODIUM CHLORIDE 0.9 % IV SOLN
2.0000 g | INTRAVENOUS | Status: AC
  Administered 2022-11-24 – 2022-11-25 (×2): 2 g via INTRAVENOUS
  Filled 2022-11-23 (×2): qty 20

## 2022-11-23 MED ORDER — HEPARIN BOLUS VIA INFUSION
5000.0000 [IU] | Freq: Once | INTRAVENOUS | Status: AC
  Administered 2022-11-23: 5000 [IU] via INTRAVENOUS

## 2022-11-23 MED ORDER — INSULIN ASPART 100 UNIT/ML IJ SOLN
0.0000 [IU] | Freq: Three times a day (TID) | INTRAMUSCULAR | Status: DC
  Administered 2022-11-24: 5 [IU] via SUBCUTANEOUS
  Administered 2022-11-24: 2 [IU] via SUBCUTANEOUS
  Administered 2022-11-24: 5 [IU] via SUBCUTANEOUS
  Administered 2022-11-25: 8 [IU] via SUBCUTANEOUS
  Administered 2022-11-25: 5 [IU] via SUBCUTANEOUS
  Administered 2022-11-25: 3 [IU] via SUBCUTANEOUS
  Administered 2022-11-26: 5 [IU] via SUBCUTANEOUS

## 2022-11-23 MED ORDER — INSULIN ASPART 100 UNIT/ML IJ SOLN
0.0000 [IU] | Freq: Every day | INTRAMUSCULAR | Status: DC
  Administered 2022-11-24: 2 [IU] via SUBCUTANEOUS

## 2022-11-23 MED ORDER — SODIUM CHLORIDE 0.9 % IV SOLN
500.0000 mg | INTRAVENOUS | Status: AC
  Administered 2022-11-24 – 2022-11-25 (×2): 500 mg via INTRAVENOUS
  Filled 2022-11-23 (×2): qty 5

## 2022-11-23 NOTE — Assessment & Plan Note (Addendum)
Presenting with dyspnea on exertion, mild hypoxia, O2 sats 89% on room air with ambulation. D-Dimer markedly elevated at 16.81.  No frequent falls, no history of GI bleed. - CTA chest- CTA chest-acute bilateral pulmonary emboli extending into segmental and subsegmental branches in all lobes, CT evidence of right heart strain consistent with at least submassive PE.   - 4-hr Road trip a month ago, symptoms began 2 weeks after - Obtain Bilat LE  venous doppler ( Will need to re-order at [facility]) -Continue IV heparin started in ED -Admit to [facility]. EDP talked to PCCM, recommended: Admission to [facility], in case patient decompensates considering presence of Right heart strain. -Troponin 17, BNP 77 both WNL -Antihypertensives, metoprolol, telmisartan, torsemide for now, in the setting of submassive PE

## 2022-11-23 NOTE — ED Triage Notes (Addendum)
EKG and CXR done at [facility].

## 2022-11-23 NOTE — ED Notes (Signed)
[facility] is here for transport.  

## 2022-11-23 NOTE — Assessment & Plan Note (Signed)
A1c 11.4.  On Ozempic. -Resume home insulins at reduced dose 30 units twice daily (Home dose 100 a.m. 50 p.m.) - SSI- M

## 2022-11-23 NOTE — ED Triage Notes (Signed)
Pt with SOB with exertion x 2 weeks, gradually getting worse.  Pt seen UC and sent here.

## 2022-11-23 NOTE — Assessment & Plan Note (Addendum)
Presenting with dyspnea, cough of 2 weeks.  Pneumonia in the setting of submassive PE.  WBC 12.3..   Rules out for sepsis.  COVID Test negative.CTA chest-  Minimal patchy groundglass opacities in bilateral lower lobes-infectious or inflammatory. -IV ceftriaxone and azithromycin

## 2022-11-23 NOTE — ED Triage Notes (Addendum)
Pt states she has been using  ozempic and that's, and she started to notice the shortness of breath around the time she started the medication she stopped it about 2 weeks ago and still having sx's ,  Pt states when she starts to walk she feelsl ike she's gasping for air  pts daughter states she is supposed to sleep with a C-Pap machine but doesn't and would like to know if this could be the cause of the SOB.

## 2022-11-23 NOTE — Assessment & Plan Note (Addendum)
In the setting of DKA.  Currently in sinus rhythm.  Not on anticoagulation.  Per patient her cardiologist at [facility] Dr. Christie Nottingham told her to stop taking Eliquis, and to start aspirin. -Hold aspirin while on heparin

## 2022-11-23 NOTE — Progress Notes (Signed)
ANTICOAGULATION CONSULT NOTE - Initial Consult  Pharmacy Consult for heparin Indication: pulmonary embolus  Allergies  Allergen Reactions   Jardiance [Empagliflozin]    Metformin And Related Nausea And Vomiting   Patient Measurements: Height: 5\' 1"  (154.9 cm) Weight: 103.6 kg (228 lb 8 oz) IBW/kg (Calculated) : 47.8 kg Heparin Dosing Weight: 72.9 kg  Vital Signs: Temp: 98.2 F (36.8 C) (05/02 1624) Temp Source: Oral (05/02 1624) BP: 138/72 (05/02 1900) Pulse Rate: 87 (05/02 1900)  Labs: Recent Labs    11/23/22 1744  HGB 14.0  HCT 43.0  PLT 223  CREATININE 1.26*  TROPONINIHS 17   Estimated Creatinine Clearance: 57.1 mL/min (A) (by C-G formula based on SCr of 1.26 mg/dL (H)).  Medical History: Past Medical History:  Diagnosis Date   Diabetes mellitus without complication (HCC)    Hyperlipemia    Hypertension    Medications:  Infusions:   heparin     Assessment: Lindsey Vazquez is a 52 YO F admitted 5/2 with shortness of breath found to have PE. Hgb 14 and plt 223.   Goal of Therapy:  Heparin level 0.3-0.7 units/ml Monitor platelets by anticoagulation protocol: Yes   Plan:  Give 5000 units bolus x 1 Start heparin infusion at 1200 units/hr Check anti-Xa level in 6 hours and daily while on heparin Continue to monitor H&H and platelets  [name] 11/23/2022,7:34 PM

## 2022-11-23 NOTE — Assessment & Plan Note (Signed)
Mild.  O2 sats dropping to 89% on room air with ambulation.  Currently on room air sats 96% at rest.  Likely secondary to multiple segmental and subsegmental PE, and possible pneumonia.

## 2022-11-23 NOTE — Assessment & Plan Note (Signed)
Supposed to be on CPAP.  Does not use it, cannot tolerate it.

## 2022-11-23 NOTE — Assessment & Plan Note (Signed)
Stable blood pressure. - Hold Telmisartan, torsemide, metoprolol-in the setting of submassive PE, and with contrast exposure.

## 2022-11-23 NOTE — ED Notes (Signed)
Patient is being discharged from the Urgent Care and sent to the Emergency Department via private vehcile . Per NP, patient is in need of higher level of care due to SOB when ambulating. Patient is aware and verbalizes understanding of plan of care.  Vitals:   11/23/22 1507 11/23/22 1525  BP: 131/83   Pulse: (!) 102   Resp: (!) 22   Temp: 97.6 F (36.4 C)   SpO2: 91% (!) 89%

## 2022-11-23 NOTE — ED Provider Notes (Signed)
[place] [facility] AT [facility] Provider Note   CSN: [id number] Arrival date & time: 11/23/22  1613     History  Chief Complaint  Patient presents with   Shortness of Breath    Lindsey Vazquez is a 52 y.o. female with past medical history hypertension, hyperlipidemia, A-fib who presents to the ED complaining of dyspnea.  She reports that she became short of breath with some minimal lower chest pain about 2 weeks ago.  Shortness of breath worse with exertion.  No associated cough, fever, chills, nausea, vomiting, or diarrhea.  This morning she became more short of breath than she has been over the last couple weeks and presented to urgent care for evaluation where they ambulated her and she had an oxygen saturation that dropped to the 80s and was sent to the ED for further evaluation.  She denies leg pain or swelling.  She denies recent travel, surgery, or history of DVT/PE.  She notes she had an episode of A-fib last year and was briefly on Eliquis but her provider discontinued this.      Home Medications Prior to Admission medications   Medication Sig Start Date End Date Taking? Authorizing Provider  acetaminophen (TYLENOL) 500 MG tablet Take 1 tablet (500 mg total) by mouth every 6 (six) hours as needed for mild pain, fever or headache. 09/12/21  Yes Vassie Loll, MD  aspirin EC 81 MG tablet Take 81 mg by mouth daily. Swallow whole.   Yes [provider]  Cholecalciferol 50 MCG (2000 UT) TABS Take 1 tablet by mouth daily.   Yes [provider]  docusate sodium (COLACE) 100 MG capsule Take 1 capsule (100 mg total) by mouth 2 (two) times daily as needed for mild constipation. 09/12/21  Yes Vassie Loll, MD  Insulin Lispro Prot & Lispro (HUMALOG MIX 75/25 KWIKPEN) (75-25) 100 UNIT/ML Kwikpen Inject 100 Units into the skin 2 (two) times daily before a meal. Patient taking differently: Inject 50-100 Units into the skin 2 (two) times daily  before a meal. Inject 100 units in the morning and 50 units every evening per pt 09/14/22  Yes Nida, Denman George, MD  magnesium oxide (MAG-OX) 400 (240 Mg) MG tablet Take 1 tablet by mouth daily. 06/27/21  Yes [provider]  metoprolol succinate (TOPROL-XL) 50 MG 24 hr tablet Take 50 mg by mouth daily. 11/21/22  Yes [provider]  Multiple Vitamin (MULTIVITAMIN) tablet Take 1 tablet by mouth daily.   Yes [provider]  Polyethylene Glycol 3350 POWD Take by mouth. 08/31/21  Yes [provider]  potassium chloride SA (KLOR-CON M) 20 MEQ tablet Take 10 mEq by mouth every morning.   Yes [provider]  promethazine (PHENERGAN) 25 MG tablet Take 1 tablet (25 mg total) by mouth every 6 (six) hours as needed for nausea or vomiting. 10/29/21  Yes Azucena Fallen, MD  Semaglutide, 2 MG/DOSE, (OZEMPIC, 2 MG/DOSE,) 8 MG/3ML SOPN Inject 2 mg into the skin once a week. 08/11/22  Yes Nida, Denman George, MD  telmisartan (MICARDIS) 20 MG tablet Take 0.5 tablets by mouth daily. 07/07/22 07/07/23 Yes [provider]  torsemide (DEMADEX) 20 MG tablet Take 20 mg by mouth daily.   Yes [provider]  Continuous Blood Gluc Receiver (FREESTYLE LIBRE 2 READER) DEVI As directed 09/01/21   Roma Kayser, MD  Continuous Glucose Sensor (FREESTYLE LIBRE 2 SENSOR) MISC CHANGE EVERY 14 DAYS. 11/23/22   Roma Kayser,  MD      Allergies    Jardiance [empagliflozin] and Metformin and related    Review of Systems   Review of Systems  All other systems reviewed and are negative.   Physical Exam Updated Vital Signs BP 138/72   Pulse 87   Temp 98.2 F (36.8 C) (Oral)   Resp 14   Ht 5\' 1"  (1.549 m)   Wt 103.6 kg   SpO2 97%   BMI 43.17 kg/m  Physical Exam Vitals and nursing note reviewed.  Constitutional:      General: She is not in acute distress.    Appearance: Normal appearance. She is not ill-appearing, toxic-appearing or  diaphoretic.  HENT:     Head: Normocephalic and atraumatic.     Mouth/Throat:     Mouth: Mucous membranes are moist.  Eyes:     Conjunctiva/sclera: Conjunctivae normal.  Cardiovascular:     Rate and Rhythm: Normal rate and regular rhythm.     Heart sounds: No murmur heard. Pulmonary:     Effort: Pulmonary effort is normal. No tachypnea, bradypnea or respiratory distress.     Breath sounds: Normal breath sounds. No stridor. No decreased breath sounds, wheezing, rhonchi or rales.  Chest:     Chest wall: No mass, deformity, tenderness, crepitus or edema.  Abdominal:     General: Abdomen is flat.     Palpations: Abdomen is soft.     Tenderness: There is no abdominal tenderness. There is no guarding or rebound.  Musculoskeletal:        General: Normal range of motion.     Cervical back: Normal range of motion and neck supple.     Right lower leg: No tenderness. No edema.     Left lower leg: No tenderness. No edema.  Skin:    General: Skin is warm and dry.     Capillary Refill: Capillary refill takes less than 2 seconds.  Neurological:     General: No focal deficit present.     Mental Status: She is alert and oriented to person, place, and time. Mental status is at baseline.  Psychiatric:        Mood and Affect: Mood normal.        Behavior: Behavior normal.     ED Results / Procedures / Treatments   Labs (all labs ordered are listed, but only abnormal results are displayed) Labs Reviewed  BASIC METABOLIC PANEL - Abnormal; Notable for the following components:      Result Value   Glucose, Bld 133 (*)    Creatinine, Ser 1.26 (*)    GFR, Estimated 51 (*)    All other components within normal limits  CBC WITH DIFFERENTIAL/PLATELET - Abnormal; Notable for the following components:   WBC 12.3 (*)    Lymphs Abs 5.5 (*)    Abs Immature Granulocytes 0.20 (*)    All other components within normal limits  BLOOD GAS, VENOUS - Abnormal; Notable for the following components:   pO2,  Ven <31 (*)    Bicarbonate 29.9 (*)    Acid-Base Excess 3.2 (*)    All other components within normal limits  D-DIMER, QUANTITATIVE - Abnormal; Notable for the following components:   D-Dimer, Quant 16.81 (*)    All other components within normal limits  SARS CORONAVIRUS 2 BY RT PCR  BRAIN NATRIURETIC PEPTIDE  CBC  CBG MONITORING, ED  TROPONIN I (HIGH SENSITIVITY)    EKG Normal sinus rhythm, prolonged QTc, no acute ST-T changes, no  STEMI  Radiology CT Angio Chest PE W and/or Wo Contrast  Result Date: 11/23/2022 CLINICAL DATA:  Positive D-dimer and shortness of breath. EXAM: CT ANGIOGRAPHY CHEST WITH CONTRAST TECHNIQUE: Multidetector CT imaging of the chest was performed using the standard protocol during bolus administration of intravenous contrast. Multiplanar CT image reconstructions and MIPs were obtained to evaluate the vascular anatomy. RADIATION DOSE REDUCTION: This exam was performed according to the departmental dose-optimization program which includes automated exposure control, adjustment of the mA and/or kV according to patient size and/or use of iterative reconstruction technique. CONTRAST:  OMNIPAQUE IOHEXOL 350 MG/ML SOLN COMPARISON:  None Available. FINDINGS: Cardiovascular: Pulmonary emboli are seen within the bilateral distal main pulmonary artery extending into segmental and subsegmental branches of all lobes. Heart and aorta are normal in size. There is no pericardial effusion. Mediastinum/Nodes: No enlarged mediastinal, hilar, or axillary lymph nodes. Right thyroid gland is enlarged. There is a hypodense nodule in the right thyroid measuring 8 mm. There are no enlarged mediastinal or hilar lymph nodes. Esophagus is within normal limits. Lungs/Pleura: There are minimal patchy ground-glass opacities in the bilateral lower lobes. The lungs are otherwise clear. There is no pleural effusion or pneumothorax. Upper Abdomen: There surgical changes in the stomach. Musculoskeletal:  No chest wall abnormality. No acute or significant osseous findings. Review of the MIP images confirms the above findings. IMPRESSION: 1. Acute bilateral pulmonary emboli involving the distal main pulmonary arteries extending into segmental and subsegmental branches of all lobes. Positive for acute PE with CTevidence of right heart strain (RV/LV Ratio = 1.2) consistent with at least submassive (intermediate risk) PE. The presence of right heart strain has been associated with an increased risk of morbidity and mortality. 2. Minimal patchy ground-glass opacities in the bilateral lower lobes, likely infectious/inflammatory. 3. Subcentimeter incidental right thyroid nodule. No follow-up imaging is recommended. Reference: J Am Coll Radiol. 2015 Feb;12(2): 143-50 Electronically Signed   By: Darliss Cheney M.D.   On: 11/23/2022 19:18   DG Chest 2 View  Result Date: 11/23/2022 CLINICAL DATA:  Shortness of breath EXAM: CHEST - 2 VIEW COMPARISON:  11/23/22 CXR FINDINGS: No pleural effusion. No pneumothorax. No focal airspace opacity. Normal cardiac and mediastinal contours. No radiographically apparent displaced rib fractures. Visualized upper abdomen is unremarkable. Vertebral body heights are maintained. IMPRESSION: No focal airspace opacity. Electronically Signed   By: Lorenza Cambridge M.D.   On: 11/23/2022 17:17   DG Chest 2 View  Result Date: 11/23/2022 CLINICAL DATA:  Shortness of breath. EXAM: CHEST - 2 VIEW COMPARISON:  None Available. FINDINGS: The heart size and mediastinal contours are within normal limits. Both lungs are clear. The visualized skeletal structures are unremarkable. IMPRESSION: No active cardiopulmonary disease. Electronically Signed   By: Lupita Raider M.D.   On: 11/23/2022 15:32    Procedures .Critical Care  Performed by: Tonette Lederer, PA-C Authorized by: Tonette Lederer, PA-C   Critical care provider statement:    Critical care time (minutes):  60   Critical care start time:   11/23/2022 4:30 PM   Critical care end time:  11/23/2022 8:45 PM   Critical care time was exclusive of:  Separately billable procedures and treating other patients and teaching time   Critical care was necessary to treat or prevent imminent or life-threatening deterioration of the following conditions:  Cardiac failure, renal failure, circulatory failure, respiratory failure, sepsis, shock and dehydration   Critical care was time spent personally by me on the following activities:  Ordering and performing treatments and interventions, ordering and review of laboratory studies, ordering and review of radiographic studies, pulse oximetry, re-evaluation of patient's condition, discussions with consultants, development of treatment plan with patient or surrogate, evaluation of patient's response to treatment, examination of patient and review of old charts   I assumed direction of critical care for this patient from another provider in my specialty: no     Care discussed with: admitting provider       Medications Ordered in ED Medications  heparin bolus via infusion 5,000 Units (has no administration in time range)  heparin ADULT infusion 100 units/mL (25000 units/258mL) (has no administration in time range)  iohexol (OMNIPAQUE) 350 MG/ML injection 100 mL (100 mLs Intravenous Contrast Given 11/23/22 1850)    ED Course/ Medical Decision Making/ A&P                             Medical Decision Making Amount and/or Complexity of Data Reviewed Labs: ordered. Decision-making details documented in ED Course. Radiology: ordered. Decision-making details documented in ED Course. ECG/medicine tests: ordered. Decision-making details documented in ED Course.  Risk Prescription drug management. Decision regarding hospitalization.   Medical Decision Making:   IDABELLE MCPETERS is a 52 y.o. female who presented to the ED today with dyspnea detailed above.    Additional history discussed with patient's  family/caregivers.  Patient's presentation is complicated by their history of hypertension, hyperlipidemia, A-fib.  Patient placed on continuous vitals and telemetry monitoring while in ED which was reviewed periodically.  Complete initial physical exam performed, notably the patient was in no acute distress.  Regular rate and rhythm.  Lungs clear to auscultation.  No signs of respiratory distress.  Abdomen soft and nontender.  Neurologically intact.    Reviewed and confirmed nursing documentation for past medical history, family history, social history.    Initial Assessment:   With the patient's presentation, differential diagnosis includes but is not limited to  Cardiac (AHF, pericardial effusion and tamponade, arrhythmias, ischemia, etc) Respiratory (COPD, asthma, pneumonia, pneumothorax, primary pulmonary hypertension, PE/VQ mismatch) Hematological (anemia) Neuromuscular (ALS, Guillain-Barr, etc)  This is most consistent with an acute complicated illness  Initial Plan:  Screening labs including CBC and Metabolic panel to evaluate for infectious or metabolic etiology of disease.  CXR to evaluate for structural/infectious intrathoracic pathology.  EKG to evaluate for cardiac pathology Troponin to evaluate for cardiac etiology BNP to evaluate for fluid overload Blood gas to assess acid-base status D-dimer to assess for risk of PE COVID swab to assess for viral etiology Symptomatic treatment Objective evaluation as below reviewed   Initial Study Results:   Laboratory  All laboratory results reviewed without evidence of clinically relevant pathology.   Exceptions include: Creatinine 1.26, GFR 51, D-dimer 16.81  EKG EKG was reviewed independently. Normal sinus rhythm, prolonged QTc, no acute ST-T changes, no STEMI  Radiology:  All images reviewed independently. Agree with radiology report at this time.   CT Angio Chest PE W and/or Wo Contrast  Result Date: 11/23/2022 CLINICAL  DATA:  Positive D-dimer and shortness of breath. EXAM: CT ANGIOGRAPHY CHEST WITH CONTRAST TECHNIQUE: Multidetector CT imaging of the chest was performed using the standard protocol during bolus administration of intravenous contrast. Multiplanar CT image reconstructions and MIPs were obtained to evaluate the vascular anatomy. RADIATION DOSE REDUCTION: This exam was performed according to the departmental dose-optimization program which includes automated exposure control, adjustment of the mA and/or kV  according to patient size and/or use of iterative reconstruction technique. CONTRAST:  OMNIPAQUE IOHEXOL 350 MG/ML SOLN COMPARISON:  None Available. FINDINGS: Cardiovascular: Pulmonary emboli are seen within the bilateral distal main pulmonary artery extending into segmental and subsegmental branches of all lobes. Heart and aorta are normal in size. There is no pericardial effusion. Mediastinum/Nodes: No enlarged mediastinal, hilar, or axillary lymph nodes. Right thyroid gland is enlarged. There is a hypodense nodule in the right thyroid measuring 8 mm. There are no enlarged mediastinal or hilar lymph nodes. Esophagus is within normal limits. Lungs/Pleura: There are minimal patchy ground-glass opacities in the bilateral lower lobes. The lungs are otherwise clear. There is no pleural effusion or pneumothorax. Upper Abdomen: There surgical changes in the stomach. Musculoskeletal: No chest wall abnormality. No acute or significant osseous findings. Review of the MIP images confirms the above findings. IMPRESSION: 1. Acute bilateral pulmonary emboli involving the distal main pulmonary arteries extending into segmental and subsegmental branches of all lobes. Positive for acute PE with CTevidence of right heart strain (RV/LV Ratio = 1.2) consistent with at least submassive (intermediate risk) PE. The presence of right heart strain has been associated with an increased risk of morbidity and mortality. 2. Minimal patchy  ground-glass opacities in the bilateral lower lobes, likely infectious/inflammatory. 3. Subcentimeter incidental right thyroid nodule. No follow-up imaging is recommended. Reference: J Am Coll Radiol. 2015 Feb;12(2): 143-50 Electronically Signed   By: Darliss Cheney M.D.   On: 11/23/2022 19:18   DG Chest 2 View  Result Date: 11/23/2022 CLINICAL DATA:  Shortness of breath EXAM: CHEST - 2 VIEW COMPARISON:  11/23/22 CXR FINDINGS: No pleural effusion. No pneumothorax. No focal airspace opacity. Normal cardiac and mediastinal contours. No radiographically apparent displaced rib fractures. Visualized upper abdomen is unremarkable. Vertebral body heights are maintained. IMPRESSION: No focal airspace opacity. Electronically Signed   By: Lorenza Cambridge M.D.   On: 11/23/2022 17:17   DG Chest 2 View  Result Date: 11/23/2022 CLINICAL DATA:  Shortness of breath. EXAM: CHEST - 2 VIEW COMPARISON:  None Available. FINDINGS: The heart size and mediastinal contours are within normal limits. Both lungs are clear. The visualized skeletal structures are unremarkable. IMPRESSION: No active cardiopulmonary disease. Electronically Signed   By: Lupita Raider M.D.   On: 11/23/2022 15:32      Consults: Case discussed with critical care team, Peterson Lombard, who recommended admission to [facility] and trending of troponin, BNP, EKG with continuation of heparin.  Patient can be admitted to hospital medicine team.  Discussed with hospitalist who accepts admission and will arrange transfer to [facility].  Final Assessment and Plan:   53 year old female presenting to the ED for evaluation of shortness of breath.  Seen in urgent care today and desaturated on room air when ambulating to approximately 89%.  She reports that she has never had issues with her lungs before.  Former smoker but it has been many years.  On initial exam, patient has no signs of respiratory distress.  Regular rate and rhythm.  Maintaining oxygen  saturation on room air at rest.  Workup initiated as above for further assessment.  D-dimer returned elevated at 16.81.  COVID swab negative.  Chest x-ray negative.  No acute EKG changes.  Minimal leukocytosis.  Patient has been afebrile.  Normal pH.  Creatinine minimally elevated at 1.26 patient has had similar elevations in the past.  CT showing multiple PEs.  Patient with history of A-fib but not currently in A-fib.  She is neurologically intact.  With these findings, initiated heparin.  Questionable infectious process at the base of the lungs.  Patient afebrile, minimal leukocytosis, no cough or congestion, low suspicion for this so we will hold on antibiotics at this time.  Talked with critical care and they recommend continuing with heparin and having patient admitted at [facility].  Discussed with hospitalist who will arrange admission.  Patient has remained stable throughout ED stay and agreeable to plan for admission.  Vital signs are remained stable.  Patient stable at time of disposition and will be transferred to [facility] when a bed becomes available.  Discussed case with attending physician who cosigned this note who agreed with management.   Clinical Impression:  1. Multiple subsegmental pulmonary emboli without acute cor pulmonale (HCC)   2. Hypoxia   3. Pneumonia of both lower lobes due to infectious organism      Admit      Clinical Impression:  1. Multiple subsegmental pulmonary emboli without acute cor pulmonale (HCC)   2. Hypoxia   3. Pneumonia of both lower lobes due to infectious organism      Data Unavailable           Final Clinical Impression(s) / ED Diagnoses Final diagnoses:  Multiple subsegmental pulmonary emboli without acute cor pulmonale (HCC)  Hypoxia  Pneumonia of both lower lobes due to infectious organism    Rx / DC Orders ED Discharge Orders     None         Richardson Dopp 11/23/22 2109    Bethann Berkshire, MD 11/25/22  1239

## 2022-11-23 NOTE — H&P (Signed)
History and Physical    Lindsey Vazquez:[id number] DOB: Nov 19, 1970 DOA: 11/23/2022  PCP: Erasmo Downer, NP   Patient coming from: Home  I have personally briefly reviewed patient's old medical records in [facility]  Chief Complaint: Difficulty Breathing  HPI: Lindsey Vazquez is a 52 y.o. female with medical history significant for  Uncontrolled diabetes, HTN, Obesity, OSA, paroxysmal atrial fibrillation in the setting of DKA. Patient presented to the ED with complaints of difficulty breathing only with exertion that started about 2 weeks ago.  No chest pain.  No lower extremity swelling.  She reports dry cough over the past 2 weeks also.  No fevers no chills.  No personal or family history of blood clots. Exactly 1 month ago (first week of April) she took a 4-hour trip to [place], her husband drove, they drove 4 hrs straight, without taking a break, 3 days later they drove back home.  No history of surgeries.  She is pretty active, she works at a [employer].  She went to urgent care, she was referred to the ED, chest x-ray was clear, O2 sats dropped to 89% on room air with ambulation, at rest sats were in the 90s.  ED Course: O2 sats 96% on room air.  D-dimer elevated at 16.81.  WBC 12.3.  CTA chest-acute bilateral pulmonary emboli extending into segmental and subsegmental branches in all lobes, CT evidence of right heart strain consistent with at least submassive PE.  Minimal patchy groundglass opacities in bilateral lower lobes-infectious or inflammatory. IV heparin started. EDP consulted PCCM at [facility], recommended admission to [facility] due to right heart strain, in case patient decompensates.  Review of Systems: As per HPI all other systems reviewed and negative.  Past Medical History:  Diagnosis Date   Diabetes mellitus without complication (HCC)    Hyperlipemia    Hypertension     Past Surgical History:  Procedure Laterality Date    c-section     CARDIAC CATHETERIZATION  07/2021   LAPAROSCOPIC GASTRIC SLEEVE RESECTION  08/11/2021     reports that she quit smoking about 7 years ago. Her smoking use included cigarettes. She has never used smokeless tobacco. She reports that she does not drink alcohol and does not use drugs.  Allergies  Allergen Reactions   Jardiance [Empagliflozin]    Metformin And Related Nausea And Vomiting   No family history of pulmonary embolism or deep venous thrombosis.  Prior to Admission medications   Medication Sig Start Date End Date Taking? Authorizing Provider  acetaminophen (TYLENOL) 500 MG tablet Take 1 tablet (500 mg total) by mouth every 6 (six) hours as needed for mild pain, fever or headache. 09/12/21  Yes Vassie Loll, MD  aspirin EC 81 MG tablet Take 81 mg by mouth daily. Swallow whole.   Yes [provider]  Cholecalciferol 50 MCG (2000 UT) TABS Take 1 tablet by mouth daily.   Yes [provider]  docusate sodium (COLACE) 100 MG capsule Take 1 capsule (100 mg total) by mouth 2 (two) times daily as needed for mild constipation. 09/12/21  Yes Vassie Loll, MD  Insulin Lispro Prot & Lispro (HUMALOG MIX 75/25 KWIKPEN) (75-25) 100 UNIT/ML Kwikpen Inject 100 Units into the skin 2 (two) times daily before a meal. Patient taking differently: Inject 50-100 Units into the skin 2 (two) times daily before a meal. Inject 100 units in the morning and 50 units every evening per pt 09/14/22  Yes  Roma Kayser, MD  magnesium oxide (MAG-OX) 400 (240 Mg) MG tablet Take 1 tablet by mouth daily. 06/27/21  Yes [provider]  metoprolol succinate (TOPROL-XL) 50 MG 24 hr tablet Take 50 mg by mouth daily. 11/21/22  Yes [provider]  Multiple Vitamin (MULTIVITAMIN) tablet Take 1 tablet by mouth daily.   Yes [provider]  Polyethylene Glycol 3350 POWD Take by mouth. 08/31/21  Yes [provider]  potassium chloride SA (KLOR-CON M) 20 MEQ  tablet Take 10 mEq by mouth every morning.   Yes [provider]  promethazine (PHENERGAN) 25 MG tablet Take 1 tablet (25 mg total) by mouth every 6 (six) hours as needed for nausea or vomiting. 10/29/21  Yes Azucena Fallen, MD  Semaglutide, 2 MG/DOSE, (OZEMPIC, 2 MG/DOSE,) 8 MG/3ML SOPN Inject 2 mg into the skin once a week. 08/11/22  Yes Nida, Denman George, MD  telmisartan (MICARDIS) 20 MG tablet Take 0.5 tablets by mouth daily. 07/07/22 07/07/23 Yes [provider]  torsemide (DEMADEX) 20 MG tablet Take 20 mg by mouth daily.   Yes [provider]  Continuous Blood Gluc Receiver (FREESTYLE LIBRE 2 READER) DEVI As directed 09/01/21   Roma Kayser, MD  Continuous Glucose Sensor (FREESTYLE LIBRE 2 SENSOR) MISC CHANGE EVERY 14 DAYS. 11/23/22   Roma Kayser, MD    Physical Exam: Vitals:   11/23/22 1620 11/23/22 1624 11/23/22 1900  BP:  115/77 138/72  Pulse:  97 87  Resp:  20 14  Temp:  98.2 F (36.8 C)   TempSrc:  Oral   SpO2:  96% 97%  Weight: 103.6 kg    Height: 5\' 1"  (1.549 m)      Constitutional: NAD, calm, comfortable Vitals:   11/23/22 1620 11/23/22 1624 11/23/22 1900  BP:  115/77 138/72  Pulse:  97 87  Resp:  20 14  Temp:  98.2 F (36.8 C)   TempSrc:  Oral   SpO2:  96% 97%  Weight: 103.6 kg    Height: 5\' 1"  (1.549 m)     Eyes: PERRL, lids and conjunctivae normal ENMT: Mucous membranes are moist.   Neck: normal, supple, no masses, no thyromegaly Respiratory: clear to auscultation bilaterally, no wheezing, no crackles. Normal respiratory effort. No accessory muscle use.  Cardiovascular: Regular rate and rhythm, no murmurs / rubs / gallops. No extremity edema.   Extremities warm. Abdomen: no tenderness, no masses palpated. No hepatosplenomegaly. Bowel sounds positive.  Musculoskeletal: no clubbing / cyanosis. No joint deformity upper and lower extremities.  Skin: no rashes, lesions, ulcers. No induration Neurologic: No facial  asymmetry, speech clear and fluent without aphasia, moving all extremities against gravity Psychiatric: Normal judgment and insight. Alert and oriented x 3. Normal mood.   Labs on Admission: I have personally reviewed following labs and imaging studies  CBC: Recent Labs  Lab 11/23/22 1744  WBC 12.3*  NEUTROABS 5.5  HGB 14.0  HCT 43.0  MCV 90.7  PLT 223   Basic Metabolic Panel: Recent Labs  Lab 11/23/22 1744  NA 135  K 3.6  CL 99  CO2 24  GLUCOSE 133*  BUN 20  CREATININE 1.26*  CALCIUM 8.9   CBG: Recent Labs  Lab 11/23/22 1946  GLUCAP 93    Radiological Exams on Admission: CT Angio Chest PE W and/or Wo Contrast  Result Date: 11/23/2022 CLINICAL DATA:  Positive D-dimer and shortness of breath. EXAM: CT ANGIOGRAPHY CHEST WITH CONTRAST TECHNIQUE: Multidetector CT imaging of the  chest was performed using the standard protocol during bolus administration of intravenous contrast. Multiplanar CT image reconstructions and MIPs were obtained to evaluate the vascular anatomy. RADIATION DOSE REDUCTION: This exam was performed according to the departmental dose-optimization program which includes automated exposure control, adjustment of the mA and/or kV according to patient size and/or use of iterative reconstruction technique. CONTRAST:  OMNIPAQUE IOHEXOL 350 MG/ML SOLN COMPARISON:  None Available. FINDINGS: Cardiovascular: Pulmonary emboli are seen within the bilateral distal main pulmonary artery extending into segmental and subsegmental branches of all lobes. Heart and aorta are normal in size. There is no pericardial effusion. Mediastinum/Nodes: No enlarged mediastinal, hilar, or axillary lymph nodes. Right thyroid gland is enlarged. There is a hypodense nodule in the right thyroid measuring 8 mm. There are no enlarged mediastinal or hilar lymph nodes. Esophagus is within normal limits. Lungs/Pleura: There are minimal patchy ground-glass opacities in the bilateral lower lobes.  The lungs are otherwise clear. There is no pleural effusion or pneumothorax. Upper Abdomen: There surgical changes in the stomach. Musculoskeletal: No chest wall abnormality. No acute or significant osseous findings. Review of the MIP images confirms the above findings. IMPRESSION: 1. Acute bilateral pulmonary emboli involving the distal main pulmonary arteries extending into segmental and subsegmental branches of all lobes. Positive for acute PE with CTevidence of right heart strain (RV/LV Ratio = 1.2) consistent with at least submassive (intermediate risk) PE. The presence of right heart strain has been associated with an increased risk of morbidity and mortality. 2. Minimal patchy ground-glass opacities in the bilateral lower lobes, likely infectious/inflammatory. 3. Subcentimeter incidental right thyroid nodule. No follow-up imaging is recommended. Reference: J Am Coll Radiol. 2015 Feb;12(2): 143-50 Electronically Signed   By: Darliss Cheney M.D.   On: 11/23/2022 19:18   DG Chest 2 View  Result Date: 11/23/2022 CLINICAL DATA:  Shortness of breath EXAM: CHEST - 2 VIEW COMPARISON:  11/23/22 CXR FINDINGS: No pleural effusion. No pneumothorax. No focal airspace opacity. Normal cardiac and mediastinal contours. No radiographically apparent displaced rib fractures. Visualized upper abdomen is unremarkable. Vertebral body heights are maintained. IMPRESSION: No focal airspace opacity. Electronically Signed   By: Lorenza Cambridge M.D.   On: 11/23/2022 17:17   DG Chest 2 View  Result Date: 11/23/2022 CLINICAL DATA:  Shortness of breath. EXAM: CHEST - 2 VIEW COMPARISON:  None Available. FINDINGS: The heart size and mediastinal contours are within normal limits. Both lungs are clear. The visualized skeletal structures are unremarkable. IMPRESSION: No active cardiopulmonary disease. Electronically Signed   By: Lupita Raider M.D.   On: 11/23/2022 15:32    EKG: Independently reviewed.  Sinus rhythm, rate 93, QTc 458.  T  wave depressions in lead V2, V3 and V4 no prior EKG to compare.  Assessment/Plan Principal Problem:   Pulmonary embolism (HCC) Active Problems:   CAP (community acquired pneumonia)   Acute hypoxic respiratory failure (HCC)   Uncontrolled type 2 diabetes mellitus with hyperglycemia (HCC)   Essential hypertension, benign   OSA (obstructive sleep apnea)   Morbid obesity (HCC)   Paroxysmal atrial fibrillation (HCC)  Assessment and Plan: * Pulmonary embolism (HCC) Presenting with dyspnea on exertion, mild hypoxia, O2 sats 89% on room air with ambulation. D-Dimer markedly elevated at 16.81.  No frequent falls, no history of GI bleed. - CTA chest- CTA chest-acute bilateral pulmonary emboli extending into segmental and subsegmental branches in all lobes, CT evidence of right heart strain consistent with at least submassive PE.   - 4-hr  Road trip a month ago, symptoms began 2 weeks after - Obtain Bilat LE  venous doppler ( Will need to re-order at [facility]) -Continue IV heparin started in ED -Admit to [facility]. EDP talked to PCCM, recommended: Admission to [facility], in case patient decompensates considering presence of Right heart strain. -Troponin 17, BNP 77 both WNL -Antihypertensives, metoprolol, telmisartan, torsemide for now, in the setting of submassive PE  Acute hypoxic respiratory failure (HCC) Mild.  O2 sats dropping to 89% on room air with ambulation.  Currently on room air sats 96% at rest.  Likely secondary to multiple segmental and subsegmental PE, and possible pneumonia.  CAP (community acquired pneumonia) Presenting with dyspnea, cough of 2 weeks.  Pneumonia in the setting of submassive PE.  WBC 12.3..   Rules out for sepsis.  COVID Test negative.CTA chest-  Minimal patchy groundglass opacities in bilateral lower lobes-infectious or inflammatory. -IV ceftriaxone and azithromycin  Paroxysmal atrial fibrillation (HCC) In the setting of DKA.  Currently in sinus rhythm.  Not on  anticoagulation.  Per patient her cardiologist at [facility] Dr. Christie Nottingham told her to stop taking Eliquis, and to start aspirin. -Hold aspirin while on heparin  OSA (obstructive sleep apnea) Supposed to be on CPAP.  Does not use it, cannot tolerate it.  Essential hypertension, benign Stable blood pressure. - Hold Telmisartan, torsemide, metoprolol-in the setting of submassive PE, and with contrast exposure.  Uncontrolled type 2 diabetes mellitus with hyperglycemia (HCC) A1c 11.4.  On Ozempic. -Resume home insulins at reduced dose 30 units twice daily (Home dose 100 a.m. 50 p.m.) - SSI- M   DVT prophylaxis: Heparin Code Status: FULL code- Confirmed with patient at bedside Family Communication: None at bedside Disposition Plan:  ~/>  2 days Consults called:  PCCM. Admission status: Inpt Tele I certify that at the point of admission it is my clinical judgment that the patient will require inpatient hospital care spanning beyond 2 midnights from the point of admission due to high intensity of service, high risk for further deterioration and high frequency of surveillance required.     Author: Onnie Boer, MD 11/23/2022 9:49 PM  For on call review [email or web address].

## 2022-11-23 NOTE — Discharge Instructions (Signed)
Go to the emergency department for further evaluation

## 2022-11-23 NOTE — ED Provider Notes (Signed)
[facility]    CSN: [id number] Arrival date & time: 11/23/22  1503      History   Chief Complaint No chief complaint on file.   HPI Lindsey Vazquez is a 52 y.o. female.   The history is provided by the patient.   The patient presents for complaints of shortness of breath, this started approximately 2 weeks ago.  Patient states that when she is at rest, she feels fine, symptoms started when she begins to move around.  Patient states even the slightest movement causes her to feel like she is "gasping" for air.  Patient states that she started Ozempic about 2 months ago, and her daughter informed that shortness of breath was one of the side effects.  She states she stopped that medication about 2 weeks ago, but continues to experience symptoms.  Patient denies chest pain, lower extremity edema, wheezing, weakness, numbness, or tingling.  Patient has a history of paroxysmal atrial fibrillation.  She states that was when she was in the hospital for DKA.  Patient also has a history of diabetes, with her most recent A1c of 11.4 in February.  Patient states that her endocrinologist has changed her medications, reducing the amount of times that she takes her insulin during the day.  She states that she is concerned because he does not have her diabetes under control.  Patient does have a history of sleep apnea, daughter informed the triage staff that the patient has a CPAP machine but does not use it as she should.  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      Home Medications    Prior to Admission medications   Medication Sig Start Date End Date Taking? Authorizing Provider  acetaminophen (TYLENOL) 500 MG tablet Take 1 tablet (500 mg total) by mouth every 6 (six) hours as needed for mild pain, fever or headache. 09/12/21   Vassie Loll, MD  atorvastatin (LIPITOR) 20 MG tablet Take 4 tablets (80 mg total) by mouth daily. 09/12/21   Vassie Loll, MD  Continuous Blood Gluc Receiver (FREESTYLE LIBRE 2 READER) DEVI As directed 09/01/21   Roma Kayser, MD  Continuous Glucose Sensor (FREESTYLE LIBRE 2 SENSOR) MISC CHANGE EVERY 14 DAYS. 11/23/22   Roma Kayser, MD  docusate sodium (COLACE) 100 MG capsule Take 1 capsule (100 mg total) by mouth 2 (two) times daily as needed for mild constipation. 09/12/21   Vassie Loll, MD  Insulin Lispro Prot & Lispro (HUMALOG MIX 75/25  KWIKPEN) (75-25) 100 UNIT/ML Kwikpen Inject 100 Units into the skin 2 (two) times daily before a meal. 09/14/22   Nida, Denman George, MD  magnesium oxide (MAG-OX) 400 (240 Mg) MG tablet Take 1 tablet by mouth daily. 06/27/21   [provider]  metoprolol succinate (TOPROL-XL) 100 MG 24 hr tablet Take 50 mg by mouth daily. 08/31/21   [provider]  Multiple Vitamin (MULTIVITAMIN) tablet Take 1 tablet by mouth daily.    [provider]  pantoprazole (PROTONIX) 40 MG tablet  Take 1 tablet (40 mg total) by mouth 2 (two) times daily. 09/12/21 09/12/22  Vassie Loll, MD  promethazine (PHENERGAN) 25 MG tablet Take 1 tablet (25 mg total) by mouth every 6 (six) hours as needed for nausea or vomiting. 10/29/21   Azucena Fallen, MD  Semaglutide, 2 MG/DOSE, (OZEMPIC, 2 MG/DOSE,) 8 MG/3ML SOPN Inject 2 mg into the skin once a week. 08/11/22   Roma Kayser, MD  telmisartan (MICARDIS) 20 MG tablet Take 0.5 tablets by mouth daily. 07/07/22 07/07/23  [provider]    Family History History reviewed. No pertinent family history.  Social History Social History   Tobacco Use   Smoking status: Former    Types: Cigarettes    Quit date: 07/25/2015    Years since quitting: 7.3   Smokeless tobacco: Never  Vaping Use   Vaping Use: Former  Substance Use Topics   Alcohol use: No    Comment: socially   Drug use: No     Allergies   Jardiance [empagliflozin] and Metformin and related   Review of Systems Review of Systems Per HPI  Physical Exam Triage Vital Signs ED Triage Vitals  Enc Vitals Group     BP 11/23/22 1507 131/83     Pulse Rate 11/23/22 1507 (!) 102     Resp 11/23/22 1507 (!) 22     Temp 11/23/22 1507 97.6 F (36.4 C)     Temp src --      SpO2 11/23/22 1507 91 %     Weight --      Height --      Head Circumference --      Peak Flow --      Pain Score 11/23/22 1511 0     Pain Loc --      Pain Edu? --      Excl. in GC? --    No data found.  Updated Vital Signs BP 131/83 (BP Location: Right Arm)   Pulse (!) 102   Temp 97.6 F (36.4 C)   Resp (!) 22   SpO2 (!) 89%   Visual Acuity Right Eye Distance:   Left Eye Distance:   Bilateral Distance:    Right Eye Near:   Left Eye Near:    Bilateral Near:     Physical Exam Vitals and nursing note reviewed.  Constitutional:      General: She is not in acute distress.    Appearance: Normal appearance.  HENT:     Head: Normocephalic.  Eyes:     Extraocular Movements:  Extraocular movements intact.     Conjunctiva/sclera: Conjunctivae normal.     Pupils: Pupils are equal, round, and reactive to light.  Cardiovascular:     Rate and Rhythm: Regular rhythm. Tachycardia present.     Pulses: Normal pulses.     Heart sounds: Normal heart sounds.  Pulmonary:     Effort: Pulmonary effort is normal. No respiratory distress.  Breath sounds: Normal breath sounds. No stridor. No wheezing, rhonchi or rales.  Abdominal:     General: Bowel sounds are normal.     Palpations: Abdomen is soft.     Tenderness: There is no abdominal tenderness.  Musculoskeletal:     Cervical back: Normal range of motion.  Skin:    General: Skin is warm and dry.  Neurological:     General: No focal deficit present.     Mental Status: She is alert and oriented to person, place, and time.  Psychiatric:        Mood and Affect: Mood normal.        Behavior: Behavior normal.      UC Treatments / Results  Labs (all labs ordered are listed, but only abnormal results are displayed) Labs Reviewed  POCT FASTING CBG [facility] MANUAL ENTRY - Abnormal; Notable for the following components:      Result Value   POCT Glucose ([facility]) 301 (*)    All other components within normal limits    EKG: Normal sinus rhythm with noted prolonged QT interval.  No STEMI. Attempted to locate previous EKG without success for comparison.   Radiology DG Chest 2 View  Result Date: 11/23/2022 CLINICAL DATA:  Shortness of breath. EXAM: CHEST - 2 VIEW COMPARISON:  None Available. FINDINGS: The heart size and mediastinal contours are within normal limits. Both lungs are clear. The visualized skeletal structures are unremarkable. IMPRESSION: No active cardiopulmonary disease. Electronically Signed   By: Lupita Raider M.D.   On: 11/23/2022 15:32    Procedures Procedures (including critical care time)  Medications Ordered in UC Medications - No data to display  Initial Impression / Assessment and Plan / UC  Course  I have reviewed the triage vital signs and the nursing notes.  Pertinent labs & imaging results that were available during my care of the patient were reviewed by me and considered in my medical decision making (see chart for details).  Patient presents for complaints of shortness of breath that has been present for the past 2 weeks.  Upon presentation, patient's room air sat was 95%.  Patient was ambulated around the clinic, O2 sats dropped to 89%.  CBG: 301.  Chest x-ray did not show any active cardiopulmonary disease.  Patient with a history of uncontrolled diabetes, kidney disease, and high cholesterol.  Reviewed patient's chart, patient with prior diagnosis of paroxysmal atrial fibrillation.  Cannot determine an immediate cause of the patient's shortness of breath; therefore, patient was advised to go to the emergency department for further evaluation.  Discussed findings with the patient and this provider's recommendation that she go to the emergency department for further evaluation.  Patient is in agreement with this plan of care.  Patient's O2 sat was rechecked prior to discharge and was 96% on room air..  Vital signs are stable at this time, patient able to be transported to the emergency department via private vehicle.   Final Clinical Impressions(s) / UC Diagnoses   Final diagnoses:  Dyspnea on exertion  Shortness of breath     Discharge Instructions      Go to the emergency department for further evaluation.     ED Prescriptions   None    PDMP not reviewed this encounter.   Abran Cantor, NP 11/23/22 1616

## 2022-11-24 ENCOUNTER — Inpatient Hospital Stay (HOSPITAL_COMMUNITY): Payer: 59

## 2022-11-24 DIAGNOSIS — I2694 Multiple subsegmental pulmonary emboli without acute cor pulmonale: Secondary | ICD-10-CM | POA: Diagnosis not present

## 2022-11-24 DIAGNOSIS — Z86711 Personal history of pulmonary embolism: Secondary | ICD-10-CM | POA: Diagnosis not present

## 2022-11-24 DIAGNOSIS — R0902 Hypoxemia: Secondary | ICD-10-CM | POA: Diagnosis not present

## 2022-11-24 DIAGNOSIS — I2609 Other pulmonary embolism with acute cor pulmonale: Secondary | ICD-10-CM

## 2022-11-24 LAB — BASIC METABOLIC PANEL
Anion gap: 8 (ref 5–15)
BUN: 13 mg/dL (ref 6–20)
CO2: 22 mmol/L (ref 22–32)
Calcium: 8.7 mg/dL — ABNORMAL LOW (ref 8.9–10.3)
Chloride: 106 mmol/L (ref 98–111)
Creatinine, Ser: 1 mg/dL (ref 0.44–1.00)
GFR, Estimated: >60 mL/min (ref >60)
Glucose, Bld: 163 mg/dL — ABNORMAL HIGH (ref 70–99)
Potassium: 3.9 mmol/L (ref 3.5–5.1)
Sodium: 136 mmol/L (ref 135–145)

## 2022-11-24 LAB — CBC
HCT: 39.3 % (ref 36.0–46.0)
Hemoglobin: 13.1 g/dL (ref 12.0–15.0)
MCH: 29.2 pg (ref 26.0–34.0)
MCHC: 33.3 g/dL (ref 30.0–36.0)
MCV: 87.7 fL (ref 80.0–100.0)
Platelets: 214 10*3/uL (ref 150–400)
RBC: 4.48 MIL/uL (ref 3.87–5.11)
RDW: 15 % (ref 11.5–15.5)
WBC: 11.8 10*3/uL — ABNORMAL HIGH (ref 4.0–10.5)
nRBC: 0 % (ref 0.0–0.2)

## 2022-11-24 LAB — ECHOCARDIOGRAM COMPLETE
AR max vel: 1.86 cm2
AV Peak grad: 3.5 mmHg
Ao pk vel: 0.94 m/s
Area-P 1/2: 4.8 cm2
Height: 61 in
S' Lateral: 2.4 cm
Weight: 3656 oz

## 2022-11-24 LAB — GLUCOSE, CAPILLARY
Glucose-Capillary: 141 mg/dL — ABNORMAL HIGH (ref 70–99)
Glucose-Capillary: 230 mg/dL — ABNORMAL HIGH (ref 70–99)
Glucose-Capillary: 233 mg/dL — ABNORMAL HIGH (ref 70–99)
Glucose-Capillary: 196 mg/dL — ABNORMAL HIGH (ref 70–99)
Glucose-Capillary: 237 mg/dL — ABNORMAL HIGH (ref 70–99)

## 2022-11-24 LAB — HIV ANTIBODY (ROUTINE TESTING W REFLEX): HIV Screen 4th Generation wRfx: NONREACTIVE

## 2022-11-24 MED ORDER — INSULIN ASPART PROT & ASPART (70-30 MIX) 100 UNIT/ML ~~LOC~~ SUSP
30.0000 [IU] | Freq: Two times a day (BID) | SUBCUTANEOUS | Status: DC
  Administered 2022-11-24 – 2022-11-25 (×3): 30 [IU] via SUBCUTANEOUS
  Filled 2022-11-24: qty 10

## 2022-11-24 MED ORDER — POLYETHYLENE GLYCOL 3350 17 G PO PACK: 17.0000 g | PACK | Freq: Every day | ORAL | Status: DC | PRN

## 2022-11-24 MED ORDER — ONDANSETRON HCL 4 MG/2ML IJ SOLN
4.0000 mg | Freq: Four times a day (QID) | INTRAMUSCULAR | Status: DC | PRN
  Administered 2022-11-24 – 2022-11-25 (×4): 4 mg via INTRAVENOUS
  Filled 2022-11-24 (×4): qty 2

## 2022-11-24 MED ORDER — ONDANSETRON HCL 4 MG PO TABS: 4.0000 mg | ORAL_TABLET | Freq: Four times a day (QID) | ORAL | Status: DC | PRN

## 2022-11-24 MED ORDER — ENOXAPARIN SODIUM 100 MG/ML IJ SOSY
100.0000 mg | PREFILLED_SYRINGE | Freq: Two times a day (BID) | INTRAMUSCULAR | Status: DC
  Administered 2022-11-24 (×2): 100 mg via SUBCUTANEOUS
  Filled 2022-11-24 (×3): qty 1

## 2022-11-24 NOTE — Progress Notes (Signed)
Echocardiogram 2D Echocardiogram has been performed.  Lucendia Herrlich 11/24/2022, 12:12 PM

## 2022-11-24 NOTE — Progress Notes (Signed)
ANTICOAGULATION CONSULT NOTE - Initial Consult  Pharmacy Consult for heparin>Lovenox Indication: pulmonary embolus  Allergies  Allergen Reactions   Jardiance [Empagliflozin]    Metformin And Related Nausea And Vomiting   Patient Measurements: Height: 5\' 1"  (154.9 cm) Weight: 103.6 kg (228 lb 8 oz) IBW/kg (Calculated) : 47.8 kg Heparin Dosing Weight: 72.9 kg  Vital Signs: Temp: 98 F (36.7 C) (05/03 0725) Temp Source: Oral (05/03 0725) BP: 128/80 (05/03 0725) Pulse Rate: 75 (05/03 0725)  Labs: Recent Labs    11/23/22 1744 11/24/22 0815  HGB 14.0  --   HCT 43.0  --   PLT 223  --   CREATININE 1.26* 1.00  TROPONINIHS 17  --     Estimated Creatinine Clearance: 72 mL/min (by C-G formula based on SCr of 1 mg/dL).  Medical History: Past Medical History:  Diagnosis Date   Diabetes mellitus without complication (HCC)    Hyperlipemia    Hypertension    Medications:  Infusions:   azithromycin     cefTRIAXone (ROCEPHIN)  IV     Assessment: UB is a 52 YO F admitted 5/2 with shortness of breath found to have PE. Hgb 14 and plt 223.   Some lab issue this AM. D/w Vann and we will change to SQ lovenox instead.  Goal of Therapy:  Anti-Xa: 0.6-1 Monitor platelets by anticoagulation protocol: Yes   Plan:  Dc heparin Lovenox 100mg  SQ BID F/u with oral AC  Ulyses Southward, PharmD, BCIDP, AAHIVP, CPP Infectious Disease Pharmacist 11/24/2022 9:39 AM

## 2022-11-24 NOTE — Progress Notes (Signed)
Lower extremity venous bilateral study completed.   Please see CV Proc for preliminary results.   Isayah Ignasiak, RDMS, RVT  

## 2022-11-24 NOTE — Progress Notes (Signed)
PROGRESS NOTE    Lindsey Vazquez  ZOX:[id number] DOB: [birth year] DOA: 11/23/2022 PCP: Erasmo Downer, NP    Brief Narrative:  Lindsey Vazquez is a 52 y.o. female with medical history significant for  Uncontrolled diabetes, HTN, Obesity, OSA, paroxysmal atrial fibrillation in the setting of DKA. Patient presented to the ED with complaints of difficulty breathing only with exertion that started about 2 weeks ago.  No chest pain.  No lower extremity swelling.  She reports dry cough over the past 2 weeks also.  No fevers no chills.  No personal or family history of blood clots. Exactly 1 month ago (first week of April) she took a 4-hour trip to [place], her husband drove, they drove 4 hrs straight, without taking a break, 3 days later they drove back home.  No history of surgeries.  She is pretty active, she works at a [employer].  Assessment and Plan: * Pulmonary embolism (HCC) Presenting with dyspnea on exertion, mild hypoxia, O2 sats 89% on room air with ambulation. D-Dimer markedly elevated at 16.81.  No frequent falls, no history of GI bleed. - CTA chest- CTA chest-acute bilateral pulmonary emboli extending into segmental and subsegmental branches in all lobes, CT evidence of right heart strain consistent with at least submassive PE.   - 4-hr Road trip a month ago, symptoms began 2 weeks after - Obtain Bilat LE  venous doppler -Continue IV heparin started in ED -Admit to [facility]. EDP talked to PCCM, recommended: Admission to [facility], in case patient decompensates considering presence of Right heart strain. -echo pending  Acute hypoxic respiratory failure (HCC) Mild.  O2 sats dropping to 89% on room air with ambulation.  Currently on room air sats 96% at rest.  Likely secondary to multiple segmental and subsegmental PE, and possible pneumonia.  CAP (community acquired pneumonia) Presenting with dyspnea, cough of 2 weeks.  Pneumonia in the setting of submassive  PE.  WBC 12.3..   Rules out for sepsis.  COVID Test negative.CTA chest-  Minimal patchy groundglass opacities in bilateral lower lobes-infectious or inflammatory. -IV ceftriaxone and azithromycin  Paroxysmal atrial fibrillation (HCC) In the setting of DKA.  Currently in sinus rhythm.  Not on anticoagulation.  Per patient her cardiologist at [facility] Dr. Christie Nottingham told her to stop taking Eliquis, and to start aspirin. -Hold aspirin while on heparin  OSA (obstructive sleep apnea) Supposed to be on CPAP.  Does not use it, cannot tolerate it.  Essential hypertension, benign Stable blood pressure. - Hold Telmisartan, torsemide, metoprolol-in the setting of submassive PE, and with contrast exposure.  Uncontrolled type 2 diabetes mellitus with hyperglycemia (HCC) A1c 11.4.  On Ozempic. -Resume home insulins at reduced dose 30 units twice daily) - SSI  Obesity Estimated body mass index is 43.17 kg/m as calculated from the following:   Height as of this encounter: 5\' 1"  (1.549 m).   Weight as of this encounter: 103.6 kg.   DVT prophylaxis:     Code Status: Full Code   Disposition Plan:  Level of care: Telemetry Medical Status is: Inpatient Remains inpatient appropriate because: needs IV heparin    Consultants:  None   Subjective: Has been having cramping in left leg  Objective: Vitals:   11/23/22 2330 11/24/22 0047 11/24/22 0415 11/24/22 0725  BP: 108/60 134/83 118/65 128/80  Pulse: 98 88 97 75  Resp: 16 18 18 18   Temp:  97.7 F (36.5 C) 98.2 F (36.8 C) 98 F (36.7 C)  TempSrc:  Oral Oral Oral  SpO2: 95% 96% 94% 94%  Weight:      Height:        Intake/Output Summary (Last 24 hours) at 11/24/2022 2130 Last data filed at 11/24/2022 0547 Gross per 24 hour  Intake 157.59 ml  Output --  Net 157.59 ml   Filed Weights   11/23/22 1620  Weight: 103.6 kg    Examination:   General: Appearance:    Severely obese female in no acute distress     Lungs:     Clear to  auscultation bilaterally, respirations unlabored  Heart:    Normal heart rate.    MS:   All extremities are intact.    Neurologic:   Awake, alert       Data Reviewed: I have personally reviewed following labs and imaging studies  CBC: Recent Labs  Lab 11/23/22 1744  WBC 12.3*  NEUTROABS 5.5  HGB 14.0  HCT 43.0  MCV 90.7  PLT 223   Basic Metabolic Panel: Recent Labs  Lab 11/23/22 1744  NA 135  K 3.6  CL 99  CO2 24  GLUCOSE 133*  BUN 20  CREATININE 1.26*  CALCIUM 8.9   GFR: Estimated Creatinine Clearance: 57.1 mL/min (A) (by C-G formula based on SCr of 1.26 mg/dL (H)). Liver Function Tests: No results for input(s): "AST", "ALT", "ALKPHOS", "BILITOT", "PROT", "ALBUMIN" in the last 168 hours. No results for input(s): "LIPASE", "AMYLASE" in the last 168 hours. No results for input(s): "AMMONIA" in the last 168 hours. Coagulation Profile: No results for input(s): "INR", "PROTIME" in the last 168 hours. Cardiac Enzymes: No results for input(s): "CKTOTAL", "CKMB", "CKMBINDEX", "TROPONINI" in the last 168 hours. BNP (last 3 results) No results for input(s): "PROBNP" in the last 8760 hours. HbA1C: No results for input(s): "HGBA1C" in the last 72 hours. CBG: Recent Labs  Lab 11/23/22 1946 11/24/22 0726  GLUCAP 93 141*   Lipid Profile: No results for input(s): "CHOL", "HDL", "LDLCALC", "TRIG", "CHOLHDL", "LDLDIRECT" in the last 72 hours. Thyroid Function Tests: No results for input(s): "TSH", "T4TOTAL", "FREET4", "T3FREE", "THYROIDAB" in the last 72 hours. Anemia Panel: No results for input(s): "VITAMINB12", "FOLATE", "FERRITIN", "TIBC", "IRON", "RETICCTPCT" in the last 72 hours. Sepsis Labs: No results for input(s): "PROCALCITON", "LATICACIDVEN" in the last 168 hours.  Recent Results (from the past 240 hour(s))  SARS Coronavirus 2 by RT PCR (hospital order, performed in [facility] hospital lab) *cepheid single result test* Anterior Nasal Swab     Status: None    Collection Time: 11/23/22  5:45 PM   Specimen: Anterior Nasal Swab  Result Value Ref Range Status   SARS Coronavirus 2 by RT PCR NEGATIVE NEGATIVE Final    Comment: (NOTE) SARS-CoV-2 target nucleic acids are NOT DETECTED.  The SARS-CoV-2 RNA is generally detectable in upper and lower respiratory specimens during the acute phase of infection. The lowest concentration of SARS-CoV-2 viral copies this assay can detect is 250 copies / mL. A negative result does not preclude SARS-CoV-2 infection and should not be used as the sole basis for treatment or other patient management decisions.  A negative result may occur with improper specimen collection / handling, submission of specimen other than nasopharyngeal swab, presence of viral mutation(s) within the areas targeted by this assay, and inadequate number of viral copies (<250 copies / mL). A negative result must be combined with clinical observations, patient history, and epidemiological information.  Fact Sheet for Patients:   RoadLapTop.co.za  Fact Sheet for  Healthcare Providers: http://kim-miller.com/  This test is not yet approved or  cleared by the [place] and has been authorized for detection and/or diagnosis of SARS-CoV-2 by FDA under an Emergency Use Authorization (EUA).  This EUA will remain in effect (meaning this test can be used) for the duration of the COVID-19 declaration under Section 564(b)(1) of the Act, 21 U.S.C. section 360bbb-3(b)(1), unless the authorization is terminated or revoked sooner.  Performed at [facility], [address], [place], [place] [address]          Radiology Studies: CT Angio Chest PE W and/or Wo Contrast  Result Date: 11/23/2022 CLINICAL DATA:  Positive D-dimer and shortness of breath. EXAM: CT ANGIOGRAPHY CHEST WITH CONTRAST TECHNIQUE: Multidetector CT imaging of the chest was performed using the standard protocol during bolus  administration of intravenous contrast. Multiplanar CT image reconstructions and MIPs were obtained to evaluate the vascular anatomy. RADIATION DOSE REDUCTION: This exam was performed according to the departmental dose-optimization program which includes automated exposure control, adjustment of the mA and/or kV according to patient size and/or use of iterative reconstruction technique. CONTRAST:  OMNIPAQUE IOHEXOL 350 MG/ML SOLN COMPARISON:  None Available. FINDINGS: Cardiovascular: Pulmonary emboli are seen within the bilateral distal main pulmonary artery extending into segmental and subsegmental branches of all lobes. Heart and aorta are normal in size. There is no pericardial effusion. Mediastinum/Nodes: No enlarged mediastinal, hilar, or axillary lymph nodes. Right thyroid gland is enlarged. There is a hypodense nodule in the right thyroid measuring 8 mm. There are no enlarged mediastinal or hilar lymph nodes. Esophagus is within normal limits. Lungs/Pleura: There are minimal patchy ground-glass opacities in the bilateral lower lobes. The lungs are otherwise clear. There is no pleural effusion or pneumothorax. Upper Abdomen: There surgical changes in the stomach. Musculoskeletal: No chest wall abnormality. No acute or significant osseous findings. Review of the MIP images confirms the above findings. IMPRESSION: 1. Acute bilateral pulmonary emboli involving the distal main pulmonary arteries extending into segmental and subsegmental branches of all lobes. Positive for acute PE with CTevidence of right heart strain (RV/LV Ratio = 1.2) consistent with at least submassive (intermediate risk) PE. The presence of right heart strain has been associated with an increased risk of morbidity and mortality. 2. Minimal patchy ground-glass opacities in the bilateral lower lobes, likely infectious/inflammatory. 3. Subcentimeter incidental right thyroid nodule. No follow-up imaging is recommended. Reference: J Am Coll  Radiol. 2015 Feb;12(2): 143-50 Electronically Signed   By: Darliss Cheney M.D.   On: 11/23/2022 19:18   DG Chest 2 View  Result Date: 11/23/2022 CLINICAL DATA:  Shortness of breath EXAM: CHEST - 2 VIEW COMPARISON:  11/23/22 CXR FINDINGS: No pleural effusion. No pneumothorax. No focal airspace opacity. Normal cardiac and mediastinal contours. No radiographically apparent displaced rib fractures. Visualized upper abdomen is unremarkable. Vertebral body heights are maintained. IMPRESSION: No focal airspace opacity. Electronically Signed   By: Lorenza Cambridge M.D.   On: 11/23/2022 17:17   DG Chest 2 View  Result Date: 11/23/2022 CLINICAL DATA:  Shortness of breath. EXAM: CHEST - 2 VIEW COMPARISON:  None Available. FINDINGS: The heart size and mediastinal contours are within normal limits. Both lungs are clear. The visualized skeletal structures are unremarkable. IMPRESSION: No active cardiopulmonary disease. Electronically Signed   By: Lupita Raider M.D.   On: 11/23/2022 15:32        Scheduled Meds:  insulin aspart  0-15 Units Subcutaneous TID WC   insulin aspart  0-5  Units Subcutaneous QHS   insulin aspart protamine- aspart  30 Units Subcutaneous BID WC   Continuous Infusions:  azithromycin     cefTRIAXone (ROCEPHIN)  IV     heparin 1,200 Units/hr (11/24/22 0547)     LOS: 1 day    Time spent: 35 minutes spent on chart review, discussion with nursing staff, consultants, updating family and interview/physical exam; more than 50% of that time was spent in counseling and/or coordination of care.    Joseph Art, DO [facility] Available via Epic secure chat 7am-7pm After these hours, please refer to coverage provider listed on [email or web address] 11/24/2022, 8:26 AM

## 2022-11-25 LAB — BASIC METABOLIC PANEL
Anion gap: 7 (ref 5–15)
BUN: 17 mg/dL (ref 6–20)
CO2: 22 mmol/L (ref 22–32)
Calcium: 8.7 mg/dL — ABNORMAL LOW (ref 8.9–10.3)
Chloride: 106 mmol/L (ref 98–111)
Creatinine, Ser: 1.1 mg/dL — ABNORMAL HIGH (ref 0.44–1.00)
GFR, Estimated: >60 mL/min (ref >60)
Glucose, Bld: 213 mg/dL — ABNORMAL HIGH (ref 70–99)
Potassium: 4.1 mmol/L (ref 3.5–5.1)
Sodium: 135 mmol/L (ref 135–145)

## 2022-11-25 LAB — CBC
HCT: 39.5 % (ref 36.0–46.0)
Hemoglobin: 13 g/dL (ref 12.0–15.0)
MCH: 29.3 pg (ref 26.0–34.0)
MCHC: 32.9 g/dL (ref 30.0–36.0)
MCV: 89.2 fL (ref 80.0–100.0)
Platelets: 222 10*3/uL (ref 150–400)
RBC: 4.43 MIL/uL (ref 3.87–5.11)
RDW: 15.4 % (ref 11.5–15.5)
WBC: 10.5 10*3/uL (ref 4.0–10.5)
nRBC: 0 % (ref 0.0–0.2)

## 2022-11-25 LAB — GLUCOSE, CAPILLARY
Glucose-Capillary: 225 mg/dL — ABNORMAL HIGH (ref 70–99)
Glucose-Capillary: 197 mg/dL — ABNORMAL HIGH (ref 70–99)
Glucose-Capillary: 258 mg/dL — ABNORMAL HIGH (ref 70–99)
Glucose-Capillary: 176 mg/dL — ABNORMAL HIGH (ref 70–99)

## 2022-11-25 MED ORDER — APIXABAN 5 MG PO TABS: 5.0000 mg | ORAL_TABLET | Freq: Two times a day (BID) | ORAL | Status: DC

## 2022-11-25 MED ORDER — DOXYCYCLINE HYCLATE 100 MG PO TABS
100.0000 mg | ORAL_TABLET | Freq: Two times a day (BID) | ORAL | Status: DC
  Administered 2022-11-26: 100 mg via ORAL
  Filled 2022-11-25: qty 1

## 2022-11-25 MED ORDER — MENTHOL 3 MG MT LOZG
1.0000 | LOZENGE | OROMUCOSAL | Status: DC | PRN
  Administered 2022-11-25: 3 mg via ORAL
  Filled 2022-11-25 (×2): qty 9

## 2022-11-25 MED ORDER — APIXABAN 5 MG PO TABS
10.0000 mg | ORAL_TABLET | Freq: Two times a day (BID) | ORAL | Status: DC
  Administered 2022-11-25 – 2022-11-26 (×3): 10 mg via ORAL
  Filled 2022-11-25 (×3): qty 2

## 2022-11-25 MED ORDER — INSULIN ASPART PROT & ASPART (70-30 MIX) 100 UNIT/ML ~~LOC~~ SUSP
40.0000 [IU] | Freq: Two times a day (BID) | SUBCUTANEOUS | Status: DC
  Administered 2022-11-25 – 2022-11-26 (×2): 40 [IU] via SUBCUTANEOUS
  Filled 2022-11-25: qty 10

## 2022-11-25 NOTE — Care Management (Signed)
Spoke w patient at bedside. Provided her with 30 day free cad and $10 copay reduction card for Eliquis.  She states she may have used a 30 day card in the past but hasn't used a copay reduction card. She will try to use 30 day card but understands it may not work.  I instructed her to call to activate the $10 copay card today before she leaves tomorrow so it will be ready to work for her  when she picks them up.

## 2022-11-25 NOTE — Progress Notes (Signed)
PROGRESS NOTE    Lindsey Vazquez  ZOX:[id number] DOB: 04-19-1971 DOA: 11/23/2022 PCP: Erasmo Downer, NP    Brief Narrative:  Lindsey Vazquez is a 52 y.o. female with medical history significant for  Uncontrolled diabetes, HTN, Obesity, OSA, paroxysmal atrial fibrillation in the setting of DKA. Patient presented to the ED with complaints of difficulty breathing only with exertion that started about 2 weeks ago.  No chest pain.  No lower extremity swelling.  She reports dry cough over the past 2 weeks also.  No fevers no chills.  No personal or family history of blood clots. Exactly 1 month ago (first week of April) she took a 4-hour trip to [place], her husband drove, they drove 4 hrs straight, without taking a break, 3 days later they drove back home.  No history of surgeries.  She is pretty active, she works at a [employer].  Will need note for work.  Assessment and Plan: * Pulmonary embolism (HCC) Presenting with dyspnea on exertion, mild hypoxia, O2 sats 89% on room air with ambulation. D-Dimer markedly elevated at 16.81.  No frequent falls, no history of GI bleed. - CTA chest- CTA chest-acute bilateral pulmonary emboli extending into segmental and subsegmental branches in all lobes, CT evidence of right heart strain consistent with at least submassive PE.   - 4-hr Road trip a month ago, symptoms began 2 weeks after - +DVT on right -changed to eliquis -echo done without RHS -need to ensure patient UTD on all cancer screenings  Acute hypoxic respiratory failure (HCC) Mild.  O2 sats dropping to 89% on room air with ambulation.  Currently on room air sats 96% at rest.  Likely secondary to multiple segmental and subsegmental PE, and possible pneumonia. -home O2 eval  CAP (community acquired pneumonia) Presenting with dyspnea, cough of 2 weeks.  Pneumonia in the setting of submassive PE.  WBC 12.3..   Rules out for sepsis.  COVID Test negative.CTA chest-   Minimal patchy groundglass opacities in bilateral lower lobes-infectious or inflammatory. -IV ceftriaxone and azithromycin-- change to doxy in AM  Paroxysmal atrial fibrillation (HCC) In the setting of DKA.  Currently in sinus rhythm.  Not on anticoagulation.  Per patient her cardiologist at [facility] Dr. Christie Nottingham told her to stop taking Eliquis, and to start aspirin. -now on eliquis  OSA (obstructive sleep apnea) Supposed to be on CPAP.  Does not use it, cannot tolerate it.  Essential hypertension, benign Stable blood pressure. - Hold Telmisartan, torsemide, metoprolol-in the setting of submassive PE, and with contrast exposure.  Uncontrolled type 2 diabetes mellitus with hyperglycemia (HCC) A1c 11.4.  On Ozempic. -Resume home insulins at reduced dose - increase for better coverage - SSI  Obesity Estimated body mass index is 43.17 kg/m as calculated from the following:   Height as of this encounter: 5\' 1"  (1.549 m).   Weight as of this encounter: 103.6 kg.   DVT prophylaxis:  apixaban (ELIQUIS) tablet 10 mg  apixaban (ELIQUIS) tablet 5 mg    Code Status: Full Code   Disposition Plan:  Level of care: Telemetry Medical Status is: Inpatient Remains inpatient appropriate because: home in AM    Consultants:  None   Subjective: Feeling better- less SOB when walking to the bathroom  Objective: Vitals:   11/25/22 0500 11/25/22 0755 11/25/22 0757 11/25/22 1128  BP: 134/67  120/72   Pulse: 95 (!) 102 (!) 103   Resp: 17     Temp: 98.1 F (  36.7 C)     TempSrc: Oral     SpO2: 96% 98%  93%  Weight:      Height:        Intake/Output Summary (Last 24 hours) at 11/25/2022 1209 Last data filed at 11/24/2022 1500 Gross per 24 hour  Intake 750 ml  Output --  Net 750 ml   Filed Weights   11/23/22 1620  Weight: 103.6 kg    Examination:   General: Appearance:    Severely obese female in no acute distress     Lungs:      respirations unlabored  Heart:    Tachycardic.     MS:   All extremities are intact.    Neurologic:   Awake, alert       Data Reviewed: I have personally reviewed following labs and imaging studies  CBC: Recent Labs  Lab 11/23/22 1744 11/25/22 0402  WBC 12.3* 10.5  NEUTROABS 5.5  --   HGB 14.0 13.0  HCT 43.0 39.5  MCV 90.7 89.2  PLT 223 222   Basic Metabolic Panel: Recent Labs  Lab 11/23/22 1744 11/24/22 0815 11/25/22 0402  NA 135 136 135  K 3.6 3.9 4.1  CL 99 106 106  CO2 24 22 22   GLUCOSE 133* 163* 213*  BUN 20 13 17   CREATININE 1.26* 1.00 1.10*  CALCIUM 8.9 8.7* 8.7*   GFR: Estimated Creatinine Clearance: 65.5 mL/min (A) (by C-G formula based on SCr of 1.1 mg/dL (H)). Liver Function Tests: No results for input(s): "AST", "ALT", "ALKPHOS", "BILITOT", "PROT", "ALBUMIN" in the last 168 hours. No results for input(s): "LIPASE", "AMYLASE" in the last 168 hours. No results for input(s): "AMMONIA" in the last 168 hours. Coagulation Profile: No results for input(s): "INR", "PROTIME" in the last 168 hours. Cardiac Enzymes: No results for input(s): "CKTOTAL", "CKMB", "CKMBINDEX", "TROPONINI" in the last 168 hours. BNP (last 3 results) No results for input(s): "PROBNP" in the last 8760 hours. HbA1C: No results for input(s): "HGBA1C" in the last 72 hours. CBG: Recent Labs  Lab 11/24/22 1241 11/24/22 1623 11/24/22 1937 11/24/22 2201 11/25/22 0801  GLUCAP 230* 233* 196* 237* 225*   Lipid Profile: No results for input(s): "CHOL", "HDL", "LDLCALC", "TRIG", "CHOLHDL", "LDLDIRECT" in the last 72 hours. Thyroid Function Tests: No results for input(s): "TSH", "T4TOTAL", "FREET4", "T3FREE", "THYROIDAB" in the last 72 hours. Anemia Panel: No results for input(s): "VITAMINB12", "FOLATE", "FERRITIN", "TIBC", "IRON", "RETICCTPCT" in the last 72 hours. Sepsis Labs: No results for input(s): "PROCALCITON", "LATICACIDVEN" in the last 168 hours.  Recent Results (from the past 240 hour(s))  SARS Coronavirus 2 by RT PCR  (hospital order, performed in [facility] hospital lab) *cepheid single result test* Anterior Nasal Swab     Status: None   Collection Time: 11/23/22  5:45 PM   Specimen: Anterior Nasal Swab  Result Value Ref Range Status   SARS Coronavirus 2 by RT PCR NEGATIVE NEGATIVE Final    Comment: (NOTE) SARS-CoV-2 target nucleic acids are NOT DETECTED.  The SARS-CoV-2 RNA is generally detectable in upper and lower respiratory specimens during the acute phase of infection. The lowest concentration of SARS-CoV-2 viral copies this assay can detect is 250 copies / mL. A negative result does not preclude SARS-CoV-2 infection and should not be used as the sole basis for treatment or other patient management decisions.  A negative result may occur with improper specimen collection / handling, submission of specimen other than nasopharyngeal swab, presence of viral mutation(s) within the areas  targeted by this assay, and inadequate number of viral copies (<250 copies / mL). A negative result must be combined with clinical observations, patient history, and epidemiological information.  Fact Sheet for Patients:   RoadLapTop.co.za  Fact Sheet for Healthcare Providers: http://kim-miller.com/  This test is not yet approved or  cleared by the [place] FDA and has been authorized for detection and/or diagnosis of SARS-CoV-2 by FDA under an Emergency Use Authorization (EUA).  This EUA will remain in effect (meaning this test can be used) for the duration of the COVID-19 declaration under Section 564(b)(1) of the Act, 21 U.S.C. section 360bbb-3(b)(1), unless the authorization is terminated or revoked sooner.  Performed at [facility], [address] [place], [place] [address]          Radiology Studies: VAS [place] LOWER EXTREMITY VENOUS (DVT)  Result Date: 11/24/2022  Lower Venous DVT Study Patient Name:  ADONAI BRENDA  Date of Exam:   11/24/2022  Medical Rec #: [id number]            Accession #:    [id number] Date of Birth: 1970-10-30             Patient Gender: F Patient Age:   15 years Exam Location:  [facility] Procedure:      VAS [place] LOWER EXTREMITY VENOUS (DVT) Referring Phys: Marlin Canary --------------------------------------------------------------------------------  Indications: Pulmonary embolism.  Anticoagulation: Heparin to lovenox. Comparison Study: No prior studies. Performing Technologist: Jean Rosenthal RDMS, RVT  Examination Guidelines: A complete evaluation includes B-mode imaging, spectral Doppler, color Doppler, and power Doppler as needed of all accessible portions of each vessel. Bilateral testing is considered an integral part of a complete examination. Limited examinations for reoccurring indications may be performed as noted. The reflux portion of the exam is performed with the patient in reverse Trendelenburg.  +---------+---------------+---------+-----------+----------+--------------+ RIGHT    CompressibilityPhasicitySpontaneityPropertiesThrombus Aging +---------+---------------+---------+-----------+----------+--------------+ CFV      Full           Yes      Yes                                 +---------+---------------+---------+-----------+----------+--------------+ SFJ      Full                                                        +---------+---------------+---------+-----------+----------+--------------+ FV Prox  Full                                                        +---------+---------------+---------+-----------+----------+--------------+ FV Mid   Full                                                        +---------+---------------+---------+-----------+----------+--------------+ FV DistalFull                                                        +---------+---------------+---------+-----------+----------+--------------+  PFV      Full                                                         +---------+---------------+---------+-----------+----------+--------------+ POP      Full           Yes      Yes                                 +---------+---------------+---------+-----------+----------+--------------+ PTV      Full                                                        +---------+---------------+---------+-----------+----------+--------------+ PERO     None           No       No                   Acute          +---------+---------------+---------+-----------+----------+--------------+ Gastroc  Full                                                        +---------+---------------+---------+-----------+----------+--------------+   +---------+---------------+---------+-----------+----------+--------------+ LEFT     CompressibilityPhasicitySpontaneityPropertiesThrombus Aging +---------+---------------+---------+-----------+----------+--------------+ CFV      Full           Yes      Yes                                 +---------+---------------+---------+-----------+----------+--------------+ SFJ      Full                                                        +---------+---------------+---------+-----------+----------+--------------+ FV Prox  Full                                                        +---------+---------------+---------+-----------+----------+--------------+ FV Mid   Full                                                        +---------+---------------+---------+-----------+----------+--------------+ FV DistalFull                                                        +---------+---------------+---------+-----------+----------+--------------+  PFV      Full                                                        +---------+---------------+---------+-----------+----------+--------------+ POP      Full           Yes      Yes                                  +---------+---------------+---------+-----------+----------+--------------+ PTV      Full                                                        +---------+---------------+---------+-----------+----------+--------------+ PERO     Full                                                        +---------+---------------+---------+-----------+----------+--------------+ Gastroc  Full                                                        +---------+---------------+---------+-----------+----------+--------------+     Summary: RIGHT: - Findings consistent with acute deep vein thrombosis involving the right peroneal veins.  - No cystic structure found in the popliteal fossa.  LEFT: - There is no evidence of deep vein thrombosis in the lower extremity.  - No cystic structure found in the popliteal fossa.  *See table(s) above for measurements and observations.    Preliminary    ECHOCARDIOGRAM COMPLETE  Result Date: 11/24/2022    ECHOCARDIOGRAM REPORT   Patient Name:   COURTNEE RAMBEAU Date of Exam: 11/24/2022 Medical Rec #:  [id number]           Height:       61.0 in Accession #:    [id number]          Weight:       228.5 lb Date of Birth:  Oct 08, 1970            BSA:          1.999 m Patient Age:    51 years            BP:           128/80 mmHg Patient Gender: F                   HR:           89 bpm. Exam Location:  Inpatient Procedure: 2D Echo, Cardiac Doppler and Color Doppler Indications:    Pulmonary Embolus I26.09  History:        Patient has no prior history of Echocardiogram examinations.                 Arrythmias:Atrial Fibrillation; Risk Factors:Hypertension, Sleep  Apnea, Diabetes and Dyslipidemia.  Sonographer:    Lucendia Herrlich Referring Phys: [id number] [address] Pen E EMOKPAE IMPRESSIONS  1. Left ventricular ejection fraction, by estimation, is 60 to 65%. The left ventricle has normal function. The left ventricle has no regional wall motion abnormalities. Left ventricular diastolic  function could not be evaluated.  2. Right ventricular systolic function is normal. The right ventricular size is normal.  3. The mitral valve is normal in structure. No evidence of mitral valve regurgitation. No evidence of mitral stenosis.  4. The aortic valve is tricuspid. Aortic valve regurgitation is not visualized. No aortic stenosis is present.  5. The inferior vena cava is normal in size with greater than 50% respiratory variability, suggesting right atrial pressure of 3 mmHg. Comparison(s): No prior Echocardiogram. FINDINGS  Left Ventricle: Left ventricular ejection fraction, by estimation, is 60 to 65%. The left ventricle has normal function. The left ventricle has no regional wall motion abnormalities. The left ventricular internal cavity size was normal in size. There is  no left ventricular hypertrophy. Left ventricular diastolic function could not be evaluated. Right Ventricle: The right ventricular size is normal. Right ventricular systolic function is normal. Left Atrium: Left atrial size was normal in size. Right Atrium: Right atrial size was normal in size. Pericardium: There is no evidence of pericardial effusion. Mitral Valve: The mitral valve is normal in structure. No evidence of mitral valve regurgitation. No evidence of mitral valve stenosis. Tricuspid Valve: The tricuspid valve is normal in structure. Tricuspid valve regurgitation is trivial. No evidence of tricuspid stenosis. Aortic Valve: The aortic valve is tricuspid. Aortic valve regurgitation is not visualized. No aortic stenosis is present. Aortic valve peak gradient measures 3.5 mmHg. Pulmonic Valve: The pulmonic valve was normal in structure. Pulmonic valve regurgitation is not visualized. No evidence of pulmonic stenosis. Aorta: The aortic root is normal in size and structure. Venous: The inferior vena cava is normal in size with greater than 50% respiratory variability, suggesting right atrial pressure of 3 mmHg. IAS/Shunts: No  atrial level shunt detected by color flow Doppler.  LEFT VENTRICLE PLAX 2D LVIDd:         3.60 cm LVIDs:         2.40 cm LV PW:         1.00 cm LV IVS:        1.10 cm LVOT diam:     2.00 cm LV SV:         29 LV SV Index:   15 LVOT Area:     3.14 cm  RIGHT VENTRICLE            IVC RV S prime:     9.85 cm/s  IVC diam: 1.80 cm TAPSE (M-mode): 1.6 cm LEFT ATRIUM             Index        RIGHT ATRIUM           Index LA diam:        3.60 cm 1.80 cm/m   RA Area:     13.90 cm LA Vol (A2C):   19.0 ml 9.51 ml/m   RA Volume:   36.50 ml  18.26 ml/m LA Vol (A4C):   20.4 ml 10.21 ml/m LA Biplane Vol: 21.7 ml 10.86 ml/m  AORTIC VALVE AV Area (Vmax): 1.86 cm AV Vmax:        93.70 cm/s AV Peak Grad:   3.5 mmHg LVOT Vmax:      55.50 cm/s  LVOT Vmean:     35.300 cm/s LVOT VTI:       0.093 m  AORTA Ao Root diam: 2.80 cm Ao Asc diam:  2.90 cm MITRAL VALVE MV Area (PHT): 4.80 cm    SHUNTS MV Decel Time: 158 msec    Systemic VTI:  0.09 m MV E velocity: 57.20 cm/s  Systemic Diam: 2.00 cm MV A velocity: 83.00 cm/s MV E/A ratio:  0.69 Olga Millers MD Electronically signed by Olga Millers MD Signature Date/Time: 11/24/2022/12:27:55 PM    Final    CT Angio Chest PE W and/or Wo Contrast  Result Date: 11/23/2022 CLINICAL DATA:  Positive D-dimer and shortness of breath. EXAM: CT ANGIOGRAPHY CHEST WITH CONTRAST TECHNIQUE: Multidetector CT imaging of the chest was performed using the standard protocol during bolus administration of intravenous contrast. Multiplanar CT image reconstructions and MIPs were obtained to evaluate the vascular anatomy. RADIATION DOSE REDUCTION: This exam was performed according to the departmental dose-optimization program which includes automated exposure control, adjustment of the mA and/or kV according to patient size and/or use of iterative reconstruction technique. CONTRAST:  OMNIPAQUE IOHEXOL 350 MG/ML SOLN COMPARISON:  None Available. FINDINGS: Cardiovascular: Pulmonary emboli are seen within the  bilateral distal main pulmonary artery extending into segmental and subsegmental branches of all lobes. Heart and aorta are normal in size. There is no pericardial effusion. Mediastinum/Nodes: No enlarged mediastinal, hilar, or axillary lymph nodes. Right thyroid gland is enlarged. There is a hypodense nodule in the right thyroid measuring 8 mm. There are no enlarged mediastinal or hilar lymph nodes. Esophagus is within normal limits. Lungs/Pleura: There are minimal patchy ground-glass opacities in the bilateral lower lobes. The lungs are otherwise clear. There is no pleural effusion or pneumothorax. Upper Abdomen: There surgical changes in the stomach. Musculoskeletal: No chest wall abnormality. No acute or significant osseous findings. Review of the MIP images confirms the above findings. IMPRESSION: 1. Acute bilateral pulmonary emboli involving the distal main pulmonary arteries extending into segmental and subsegmental branches of all lobes. Positive for acute PE with CTevidence of right heart strain (RV/LV Ratio = 1.2) consistent with at least submassive (intermediate risk) PE. The presence of right heart strain has been associated with an increased risk of morbidity and mortality. 2. Minimal patchy ground-glass opacities in the bilateral lower lobes, likely infectious/inflammatory. 3. Subcentimeter incidental right thyroid nodule. No follow-up imaging is recommended. Reference: J Am Coll Radiol. 2015 Feb;12(2): 143-50 Electronically Signed   By: Darliss Cheney M.D.   On: 11/23/2022 19:18   DG Chest 2 View  Result Date: 11/23/2022 CLINICAL DATA:  Shortness of breath EXAM: CHEST - 2 VIEW COMPARISON:  11/23/22 CXR FINDINGS: No pleural effusion. No pneumothorax. No focal airspace opacity. Normal cardiac and mediastinal contours. No radiographically apparent displaced rib fractures. Visualized upper abdomen is unremarkable. Vertebral body heights are maintained. IMPRESSION: No focal airspace opacity. Electronically  Signed   By: Lorenza Cambridge M.D.   On: 11/23/2022 17:17   DG Chest 2 View  Result Date: 11/23/2022 CLINICAL DATA:  Shortness of breath. EXAM: CHEST - 2 VIEW COMPARISON:  None Available. FINDINGS: The heart size and mediastinal contours are within normal limits. Both lungs are clear. The visualized skeletal structures are unremarkable. IMPRESSION: No active cardiopulmonary disease. Electronically Signed   By: Lupita Raider M.D.   On: 11/23/2022 15:32        Scheduled Meds:  apixaban  10 mg Oral BID   Followed by   Melene Muller ON 12/01/2022] apixaban  5 mg Oral BID   [START ON 11/26/2022] doxycycline  100 mg Oral Q12H   insulin aspart  0-15 Units Subcutaneous TID WC   insulin aspart  0-5 Units Subcutaneous QHS   insulin aspart protamine- aspart  40 Units Subcutaneous BID WC   Continuous Infusions:  azithromycin 500 mg (11/25/22 0930)   cefTRIAXone (ROCEPHIN)  IV 2 g (11/25/22 0837)     LOS: 2 days    Time spent: 35 minutes spent on chart review, discussion with nursing staff, consultants, updating family and interview/physical exam; more than 50% of that time was spent in counseling and/or coordination of care.    Joseph Art, DO [facility] Available via Epic secure chat 7am-7pm After these hours, please refer to coverage provider listed on [email or web address] 11/25/2022, 12:09 PM

## 2022-11-25 NOTE — Progress Notes (Signed)
ANTICOAGULATION CONSULT NOTE - Initial Consult  Pharmacy Consult for Lovenox>Eliquis Indication: pulmonary embolus  Allergies  Allergen Reactions   Jardiance [Empagliflozin]    Metformin And Related Nausea And Vomiting   Patient Measurements: Height: 5\' 1"  (154.9 cm) Weight: 103.6 kg (228 lb 8 oz) IBW/kg (Calculated) : 47.8 kg Heparin Dosing Weight: 72.9 kg  Vital Signs: Temp: 98.1 F (36.7 C) (05/04 0500) Temp Source: Oral (05/04 0500) BP: 120/72 (05/04 0757) Pulse Rate: 103 (05/04 0757)  Labs: Recent Labs    11/23/22 1744 11/24/22 0815 11/25/22 0402  HGB 14.0  --  13.0  HCT 43.0  --  39.5  PLT 223  --  222  CREATININE 1.26* 1.00 1.10*  TROPONINIHS 17  --   --     Estimated Creatinine Clearance: 65.5 mL/min (A) (by C-G formula based on SCr of 1.1 mg/dL (H)).  Medical History: Past Medical History:  Diagnosis Date   Diabetes mellitus without complication (HCC)    Hyperlipemia    Hypertension    Medications:  Infusions:   azithromycin 500 mg (11/24/22 1104)   cefTRIAXone (ROCEPHIN)  IV 2 g (11/25/22 1610)   Assessment: UB is a 52 YO F admitted 5/2 with shortness of breath found to have PE. Hgb 14 and plt 223. Pharmacy consulted to transition from Lovenox to apixaban. Patient received 1 day of therapeutic Lovenox. Will give 6 days apixaban 10 mg BID to complete 7 day course, followed by 5 mg BID. Hgb stable.   Goal of Therapy:  Monitor platelets by anticoagulation protocol: Yes   Plan:  Apixaban 10 mg BID x6 days followed by 5 mg BID  [name], Pharm.D PGY1 Pharmacy Resident 11/25/2022 9:06 AM

## 2022-11-25 NOTE — Progress Notes (Signed)
Desaturation screen:   Patient Saturations on Room Air at Rest = 98%  Patient Saturations on Room Air while Ambulating = 93%  Patient ambulated 381ft in hallway. No complaints of shortness of breath or dizziness.

## 2022-11-26 DIAGNOSIS — Z794 Long term (current) use of insulin: Secondary | ICD-10-CM

## 2022-11-26 DIAGNOSIS — E1169 Type 2 diabetes mellitus with other specified complication: Secondary | ICD-10-CM

## 2022-11-26 LAB — CBC
HCT: 38.5 % (ref 36.0–46.0)
Hemoglobin: 12.2 g/dL (ref 12.0–15.0)
MCH: 29 pg (ref 26.0–34.0)
MCHC: 31.7 g/dL (ref 30.0–36.0)
MCV: 91.4 fL (ref 80.0–100.0)
Platelets: 226 10*3/uL (ref 150–400)
RBC: 4.21 MIL/uL (ref 3.87–5.11)
RDW: 15.3 % (ref 11.5–15.5)
WBC: 10.2 10*3/uL (ref 4.0–10.5)
nRBC: 0 % (ref 0.0–0.2)

## 2022-11-26 LAB — GLUCOSE, CAPILLARY: Glucose-Capillary: 220 mg/dL — ABNORMAL HIGH (ref 70–99)

## 2022-11-26 MED ORDER — APIXABAN (ELIQUIS) VTE STARTER PACK (10MG AND 5MG): ORAL_TABLET | ORAL | 0 refills | Status: DC

## 2022-11-26 MED ORDER — APIXABAN 5 MG PO TABS: 5.0000 mg | ORAL_TABLET | Freq: Two times a day (BID) | ORAL | 1 refills | Status: AC

## 2022-11-26 MED ORDER — ONDANSETRON HCL 4 MG PO TABS: 4.0000 mg | ORAL_TABLET | Freq: Four times a day (QID) | ORAL | 0 refills | Status: DC | PRN

## 2022-11-26 MED ORDER — DOXYCYCLINE HYCLATE 100 MG PO TABS: 100.0000 mg | ORAL_TABLET | Freq: Two times a day (BID) | ORAL | 0 refills | Status: DC

## 2022-11-26 NOTE — Discharge Summary (Signed)
Physician Discharge Summary  Lindsey Vazquez AVW:[id number] DOB: 09/17/1970 DOA: 11/23/2022  PCP: Erasmo Downer, NP  Admit date: 11/23/2022 Discharge date: 11/26/2022  Admitted From: home Discharge disposition: home   Recommendations for Outpatient Follow-Up:   New med: elqiuis for PE Staggered resumption of home BP meds ensure patient UTD on all cancer screenings   Discharge Diagnosis:   Principal Problem:   Pulmonary embolism (HCC) Active Problems:   CAP (community acquired pneumonia)   Acute hypoxic respiratory failure (HCC)   Uncontrolled type 2 diabetes mellitus with hyperglycemia (HCC)   Essential hypertension, benign   OSA (obstructive sleep apnea)   Morbid obesity (HCC)   Paroxysmal atrial fibrillation (HCC)    Discharge Condition: Improved.  Diet recommendation: Low sodium, heart healthy.  Carbohydrate-modified  Wound care: None.  Code status: Full.   History of Present Illness:   Lindsey Vazquez is a 52 y.o. female with medical history significant for  Uncontrolled diabetes, HTN, Obesity, OSA, paroxysmal atrial fibrillation in the setting of DKA. Patient presented to the ED with complaints of difficulty breathing only with exertion that started about 2 weeks ago.  No chest pain.  No lower extremity swelling.  She reports dry cough over the past 2 weeks also.  No fevers no chills.  No personal or family history of blood clots. Exactly 1 month ago (first week of April) she took a 4-hour trip to [place], her husband drove, they drove 4 hrs straight, without taking a break, 3 days later they drove back home.  No history of surgeries.  She is pretty active, she works at a [employer].   She went to urgent care, she was referred to the ED, chest x-ray was clear, O2 sats dropped to 89% on room air with ambulation, at rest sats were in the 90s.   ED Course: O2 sats 96% on room air.  D-dimer elevated at 16.81.  WBC 12.3.  CTA  chest-acute bilateral pulmonary emboli extending into segmental and subsegmental branches in all lobes, CT evidence of right heart strain consistent with at least submassive PE.  Minimal patchy groundglass opacities in bilateral lower lobes-infectious or inflammatory. IV heparin started. EDP consulted PCCM at [facility], recommended admission to [facility] due to right heart strain, in case patient decompensates.   Hospital Course by Problem:   Pulmonary embolism (HCC) Presenting with dyspnea on exertion, mild hypoxia, O2 sats 89% on room air with ambulation. D-Dimer markedly elevated at 16.81.  No frequent falls, no history of GI bleed. - CTA chest- CTA chest-acute bilateral pulmonary emboli extending into segmental and subsegmental branches in all lobes, CT evidence of right heart strain consistent with at least submassive PE.   - 4-hr Road trip a month ago, symptoms began 2 weeks after - +DVT on right -changed to eliquis -echo done without RHS -need to ensure patient UTD on all cancer screenings   Acute hypoxic respiratory failure (HCC) Mild.  O2 sats dropping to 89% on room air with ambulation.  Currently on room air sats 96% at rest.  Likely secondary to multiple segmental and subsegmental PE, and possible pneumonia. -home O2 eval negative   CAP (community acquired pneumonia) Presenting with dyspnea, cough of 2 weeks.  Pneumonia in the setting of submassive PE.  WBC 12.3..   Rules out for sepsis.  COVID Test negative.CTA chest-  Minimal patchy groundglass opacities in bilateral lower lobes-infectious or inflammatory. -IV ceftriaxone and azithromycin-- change to doxy  to finish course  Paroxysmal atrial fibrillation (HCC) In the setting of DKA.  Currently in sinus rhythm.  Not on anticoagulation.  Per patient her cardiologist at [facility] Dr. Christie Nottingham told her to stop taking Eliquis, and to start aspirin. -now on eliquis for PE   OSA (obstructive sleep apnea) Supposed to be on CPAP.  Does  not use it, cannot tolerate it.   Essential hypertension, benign Stable blood pressure. -staggered resumption of home meds   Uncontrolled type 2 diabetes mellitus with hyperglycemia (HCC) A1c 11.4.  On Ozempic. -Resume home meds   Obesity Estimated body mass index is 43.17 kg/m as calculated from the following:   Height as of this encounter: 5\' 1"  (1.549 m).   Weight as of this encounter: 103.6 kg.       Medical Consultants:      Discharge Exam:   Vitals:   11/26/22 0508 11/26/22 0812  BP: 119/72 (!) 123/56  Pulse: 88 99  Resp: 20 18  Temp: 98.2 F (36.8 C) 98.1 F (36.7 C)  SpO2: 95% 96%   Vitals:   11/25/22 2115 11/25/22 2115 11/26/22 0508 11/26/22 0812  BP: (!) 119/59 (!) 119/59 119/72 (!) 123/56  Pulse: 96 98 88 99  Resp: 20 20 20 18   Temp: 98.2 F (36.8 C) 98.2 F (36.8 C) 98.2 F (36.8 C) 98.1 F (36.7 C)  TempSrc: Oral Oral Oral Oral  SpO2: 97% 96% 95% 96%  Weight:      Height:        General exam: Appears calm and comfortable.    The results of significant diagnostics from this hospitalization (including imaging, microbiology, ancillary and laboratory) are listed below for reference.     Procedures and Diagnostic Studies:   VAS [place] LOWER EXTREMITY VENOUS (DVT)  Result Date: 11/25/2022  Lower Venous DVT Study Patient Name:  Lindsey Vazquez  Date of Exam:   11/24/2022 Medical Rec #: [id number]            Accession #:    [id number] Date of Birth: 12-23-1970             Patient Gender: F Patient Age:   56 years Exam Location:  [facility] Procedure:      VAS [place] LOWER EXTREMITY VENOUS (DVT) Referring Phys: Marlin Canary --------------------------------------------------------------------------------  Indications: Pulmonary embolism.  Anticoagulation: Heparin to lovenox. Comparison Study: No prior studies. Performing Technologist: Jean Rosenthal RDMS, RVT  Examination Guidelines: A complete evaluation includes B-mode imaging, spectral Doppler, color  Doppler, and power Doppler as needed of all accessible portions of each vessel. Bilateral testing is considered an integral part of a complete examination. Limited examinations for reoccurring indications may be performed as noted. The reflux portion of the exam is performed with the patient in reverse Trendelenburg.  +---------+---------------+---------+-----------+----------+--------------+ RIGHT    CompressibilityPhasicitySpontaneityPropertiesThrombus Aging +---------+---------------+---------+-----------+----------+--------------+ CFV      Full           Yes      Yes                                 +---------+---------------+---------+-----------+----------+--------------+ SFJ      Full                                                        +---------+---------------+---------+-----------+----------+--------------+  FV Prox  Full                                                        +---------+---------------+---------+-----------+----------+--------------+ FV Mid   Full                                                        +---------+---------------+---------+-----------+----------+--------------+ FV DistalFull                                                        +---------+---------------+---------+-----------+----------+--------------+ PFV      Full                                                        +---------+---------------+---------+-----------+----------+--------------+ POP      Full           Yes      Yes                                 +---------+---------------+---------+-----------+----------+--------------+ PTV      Full                                                        +---------+---------------+---------+-----------+----------+--------------+ PERO     None           No       No                   Acute          +---------+---------------+---------+-----------+----------+--------------+ Gastroc  Full                                                         +---------+---------------+---------+-----------+----------+--------------+   +---------+---------------+---------+-----------+----------+--------------+ LEFT     CompressibilityPhasicitySpontaneityPropertiesThrombus Aging +---------+---------------+---------+-----------+----------+--------------+ CFV      Full           Yes      Yes                                 +---------+---------------+---------+-----------+----------+--------------+ SFJ      Full                                                        +---------+---------------+---------+-----------+----------+--------------+  FV Prox  Full                                                        +---------+---------------+---------+-----------+----------+--------------+ FV Mid   Full                                                        +---------+---------------+---------+-----------+----------+--------------+ FV DistalFull                                                        +---------+---------------+---------+-----------+----------+--------------+ PFV      Full                                                        +---------+---------------+---------+-----------+----------+--------------+ POP      Full           Yes      Yes                                 +---------+---------------+---------+-----------+----------+--------------+ PTV      Full                                                        +---------+---------------+---------+-----------+----------+--------------+ PERO     Full                                                        +---------+---------------+---------+-----------+----------+--------------+ Gastroc  Full                                                        +---------+---------------+---------+-----------+----------+--------------+     Summary: RIGHT: - Findings consistent with acute deep vein thrombosis  involving the right peroneal veins.  - No cystic structure found in the popliteal fossa.  LEFT: - There is no evidence of deep vein thrombosis in the lower extremity.  - No cystic structure found in the popliteal fossa.  *See table(s) above for measurements and observations. Electronically signed by Coral Else MD on 11/25/2022 at 3:00:11 PM.    Final    ECHOCARDIOGRAM COMPLETE  Result Date: 11/24/2022    ECHOCARDIOGRAM REPORT   Patient Name:   Lindsey Vazquez Date of Exam: 11/24/2022 Medical Rec #:  [id number]  Height:       61.0 in Accession #:    [id number]          Weight:       228.5 lb Date of Birth:  [birth year]            BSA:          1.999 m Patient Age:    51 years            BP:           128/80 mmHg Patient Gender: F                   HR:           89 bpm. Exam Location:  Inpatient Procedure: 2D Echo, Cardiac Doppler and Color Doppler Indications:    Pulmonary Embolus I26.09  History:        Patient has no prior history of Echocardiogram examinations.                 Arrythmias:Atrial Fibrillation; Risk Factors:Hypertension, Sleep                 Apnea, Diabetes and Dyslipidemia.  Sonographer:    Lucendia Herrlich Referring Phys: [id number] [address]ley Pen E EMOKPAE IMPRESSIONS  1. Left ventricular ejection fraction, by estimation, is 60 to 65%. The left ventricle has normal function. The left ventricle has no regional wall motion abnormalities. Left ventricular diastolic function could not be evaluated.  2. Right ventricular systolic function is normal. The right ventricular size is normal.  3. The mitral valve is normal in structure. No evidence of mitral valve regurgitation. No evidence of mitral stenosis.  4. The aortic valve is tricuspid. Aortic valve regurgitation is not visualized. No aortic stenosis is present.  5. The inferior vena cava is normal in size with greater than 50% respiratory variability, suggesting right atrial pressure of 3 mmHg. Comparison(s): No prior Echocardiogram. FINDINGS   Left Ventricle: Left ventricular ejection fraction, by estimation, is 60 to 65%. The left ventricle has normal function. The left ventricle has no regional wall motion abnormalities. The left ventricular internal cavity size was normal in size. There is  no left ventricular hypertrophy. Left ventricular diastolic function could not be evaluated. Right Ventricle: The right ventricular size is normal. Right ventricular systolic function is normal. Left Atrium: Left atrial size was normal in size. Right Atrium: Right atrial size was normal in size. Pericardium: There is no evidence of pericardial effusion. Mitral Valve: The mitral valve is normal in structure. No evidence of mitral valve regurgitation. No evidence of mitral valve stenosis. Tricuspid Valve: The tricuspid valve is normal in structure. Tricuspid valve regurgitation is trivial. No evidence of tricuspid stenosis. Aortic Valve: The aortic valve is tricuspid. Aortic valve regurgitation is not visualized. No aortic stenosis is present. Aortic valve peak gradient measures 3.5 mmHg. Pulmonic Valve: The pulmonic valve was normal in structure. Pulmonic valve regurgitation is not visualized. No evidence of pulmonic stenosis. Aorta: The aortic root is normal in size and structure. Venous: The inferior vena cava is normal in size with greater than 50% respiratory variability, suggesting right atrial pressure of 3 mmHg. IAS/Shunts: No atrial level shunt detected by color flow Doppler.  LEFT VENTRICLE PLAX 2D LVIDd:         3.60 cm LVIDs:         2.40 cm LV PW:         1.00 cm LV IVS:  1.10 cm LVOT diam:     2.00 cm LV SV:         29 LV SV Index:   15 LVOT Area:     3.14 cm  RIGHT VENTRICLE            IVC RV S prime:     9.85 cm/s  IVC diam: 1.80 cm TAPSE (M-mode): 1.6 cm LEFT ATRIUM             Index        RIGHT ATRIUM           Index LA diam:        3.60 cm 1.80 cm/m   RA Area:     13.90 cm LA Vol (A2C):   19.0 ml 9.51 ml/m   RA Volume:   36.50 ml  18.26  ml/m LA Vol (A4C):   20.4 ml 10.21 ml/m LA Biplane Vol: 21.7 ml 10.86 ml/m  AORTIC VALVE AV Area (Vmax): 1.86 cm AV Vmax:        93.70 cm/s AV Peak Grad:   3.5 mmHg LVOT Vmax:      55.50 cm/s LVOT Vmean:     35.300 cm/s LVOT VTI:       0.093 m  AORTA Ao Root diam: 2.80 cm Ao Asc diam:  2.90 cm MITRAL VALVE MV Area (PHT): 4.80 cm    SHUNTS MV Decel Time: 158 msec    Systemic VTI:  0.09 m MV E velocity: 57.20 cm/s  Systemic Diam: 2.00 cm MV A velocity: 83.00 cm/s MV E/A ratio:  0.69 Olga Millers MD Electronically signed by Olga Millers MD Signature Date/Time: 11/24/2022/12:27:55 PM    Final    CT Angio Chest PE W and/or Wo Contrast  Result Date: 11/23/2022 CLINICAL DATA:  Positive D-dimer and shortness of breath. EXAM: CT ANGIOGRAPHY CHEST WITH CONTRAST TECHNIQUE: Multidetector CT imaging of the chest was performed using the standard protocol during bolus administration of intravenous contrast. Multiplanar CT image reconstructions and MIPs were obtained to evaluate the vascular anatomy. RADIATION DOSE REDUCTION: This exam was performed according to the departmental dose-optimization program which includes automated exposure control, adjustment of the mA and/or kV according to patient size and/or use of iterative reconstruction technique. CONTRAST:  OMNIPAQUE IOHEXOL 350 MG/ML SOLN COMPARISON:  None Available. FINDINGS: Cardiovascular: Pulmonary emboli are seen within the bilateral distal main pulmonary artery extending into segmental and subsegmental branches of all lobes. Heart and aorta are normal in size. There is no pericardial effusion. Mediastinum/Nodes: No enlarged mediastinal, hilar, or axillary lymph nodes. Right thyroid gland is enlarged. There is a hypodense nodule in the right thyroid measuring 8 mm. There are no enlarged mediastinal or hilar lymph nodes. Esophagus is within normal limits. Lungs/Pleura: There are minimal patchy ground-glass opacities in the bilateral lower lobes. The lungs  are otherwise clear. There is no pleural effusion or pneumothorax. Upper Abdomen: There surgical changes in the stomach. Musculoskeletal: No chest wall abnormality. No acute or significant osseous findings. Review of the MIP images confirms the above findings. IMPRESSION: 1. Acute bilateral pulmonary emboli involving the distal main pulmonary arteries extending into segmental and subsegmental branches of all lobes. Positive for acute PE with CTevidence of right heart strain (RV/LV Ratio = 1.2) consistent with at least submassive (intermediate risk) PE. The presence of right heart strain has been associated with an increased risk of morbidity and mortality. 2. Minimal patchy ground-glass opacities in the bilateral lower lobes, likely infectious/inflammatory. 3. Subcentimeter incidental  right thyroid nodule. No follow-up imaging is recommended. Reference: J Am Coll Radiol. 2015 Feb;12(2): 143-50 Electronically Signed   By: Darliss Cheney M.D.   On: 11/23/2022 19:18   DG Chest 2 View  Result Date: 11/23/2022 CLINICAL DATA:  Shortness of breath EXAM: CHEST - 2 VIEW COMPARISON:  11/23/22 CXR FINDINGS: No pleural effusion. No pneumothorax. No focal airspace opacity. Normal cardiac and mediastinal contours. No radiographically apparent displaced rib fractures. Visualized upper abdomen is unremarkable. Vertebral body heights are maintained. IMPRESSION: No focal airspace opacity. Electronically Signed   By: Lorenza Cambridge M.D.   On: 11/23/2022 17:17   DG Chest 2 View  Result Date: 11/23/2022 CLINICAL DATA:  Shortness of breath. EXAM: CHEST - 2 VIEW COMPARISON:  None Available. FINDINGS: The heart size and mediastinal contours are within normal limits. Both lungs are clear. The visualized skeletal structures are unremarkable. IMPRESSION: No active cardiopulmonary disease. Electronically Signed   By: Lupita Raider M.D.   On: 11/23/2022 15:32     Labs:   Basic Metabolic Panel: Recent Labs  Lab 11/23/22 1744  11/24/22 0815 11/25/22 0402  NA 135 136 135  K 3.6 3.9 4.1  CL 99 106 106  CO2 24 22 22   GLUCOSE 133* 163* 213*  BUN 20 13 17   CREATININE 1.26* 1.00 1.10*  CALCIUM 8.9 8.7* 8.7*   GFR Estimated Creatinine Clearance: 65.5 mL/min (A) (by C-G formula based on SCr of 1.1 mg/dL (H)). Liver Function Tests: No results for input(s): "AST", "ALT", "ALKPHOS", "BILITOT", "PROT", "ALBUMIN" in the last 168 hours. No results for input(s): "LIPASE", "AMYLASE" in the last 168 hours. No results for input(s): "AMMONIA" in the last 168 hours. Coagulation profile No results for input(s): "INR", "PROTIME" in the last 168 hours.  CBC: Recent Labs  Lab 11/23/22 1744 11/25/22 0402 11/26/22 0251  WBC 12.3* 10.5 10.2  NEUTROABS 5.5  --   --   HGB 14.0 13.0 12.2  HCT 43.0 39.5 38.5  MCV 90.7 89.2 91.4  PLT 223 222 226   Cardiac Enzymes: No results for input(s): "CKTOTAL", "CKMB", "CKMBINDEX", "TROPONINI" in the last 168 hours. BNP: Invalid input(s): "POCBNP" CBG: Recent Labs  Lab 11/25/22 0801 11/25/22 1219 11/25/22 1713 11/25/22 2117 11/26/22 0811  GLUCAP 225* 197* 258* 176* 220*   D-Dimer Recent Labs    11/23/22 1744  DDIMER 16.81*   Hgb A1c No results for input(s): "HGBA1C" in the last 72 hours. Lipid Profile No results for input(s): "CHOL", "HDL", "LDLCALC", "TRIG", "CHOLHDL", "LDLDIRECT" in the last 72 hours. Thyroid function studies No results for input(s): "TSH", "T4TOTAL", "T3FREE", "THYROIDAB" in the last 72 hours.  Invalid input(s): "FREET3" Anemia work up No results for input(s): "VITAMINB12", "FOLATE", "FERRITIN", "TIBC", "IRON", "RETICCTPCT" in the last 72 hours. Microbiology Recent Results (from the past 240 hour(s))  SARS Coronavirus 2 by RT PCR (hospital order, performed in [facility] hospital lab) *cepheid single result test* Anterior Nasal Swab     Status: None   Collection Time: 11/23/22  5:45 PM   Specimen: Anterior Nasal Swab  Result Value Ref Range  Status   SARS Coronavirus 2 by RT PCR NEGATIVE NEGATIVE Final    Comment: (NOTE) SARS-CoV-2 target nucleic acids are NOT DETECTED.  The SARS-CoV-2 RNA is generally detectable in upper and lower respiratory specimens during the acute phase of infection. The lowest concentration of SARS-CoV-2 viral copies this assay can detect is 250 copies / mL. A negative result does not preclude SARS-CoV-2 infection and should not  be used as the sole basis for treatment or other patient management decisions.  A negative result may occur with improper specimen collection / handling, submission of specimen other than nasopharyngeal swab, presence of viral mutation(s) within the areas targeted by this assay, and inadequate number of viral copies (<250 copies / mL). A negative result must be combined with clinical observations, patient history, and epidemiological information.  Fact Sheet for Patients:   RoadLapTop.co.za  Fact Sheet for Healthcare Providers: http://kim-miller.com/  This test is not yet approved or  cleared by the [place] FDA and has been authorized for detection and/or diagnosis of SARS-CoV-2 by FDA under an Emergency Use Authorization (EUA).  This EUA will remain in effect (meaning this test can be used) for the duration of the COVID-19 declaration under Section 564(b)(1) of the Act, 21 U.S.C. section 360bbb-3(b)(1), unless the authorization is terminated or revoked sooner.  Performed at [facility], [address], [place], [place] [address]      Discharge Instructions:   Discharge Instructions     Ambulatory referral to Endocrinology   Complete by: As directed    Diet - low sodium heart healthy   Complete by: As directed    Diet Carb Modified   Complete by: As directed    Discharge instructions   Complete by: As directed    Your BP has been normal off your BP meds, monitor your BP And resume metoprolol/micardis once SBP  (top number) >130   Increase activity slowly   Complete by: As directed       Allergies as of 11/26/2022       Reactions   Jardiance [empagliflozin]    Metformin And Related Nausea And Vomiting        Medication List     STOP taking these medications    aspirin EC 81 MG tablet   metoprolol succinate 50 MG 24 hr tablet Commonly known as: TOPROL-XL   promethazine 25 MG tablet Commonly known as: PHENERGAN   telmisartan 20 MG tablet Commonly known as: MICARDIS       TAKE these medications    acetaminophen 500 MG tablet Commonly known as: TYLENOL Take 1 tablet (500 mg total) by mouth every 6 (six) hours as needed for mild pain, fever or headache.   Apixaban Starter Pack (10mg  and 5mg ) Commonly known as: ELIQUIS STARTER PACK Take as directed on package: start with two-5mg  tablets twice daily for 7 days. On day 8, switch to one-5mg  tablet twice daily.   apixaban 5 MG Tabs tablet Commonly known as: ELIQUIS Take 1 tablet (5 mg total) by mouth 2 (two) times daily. To start after you finish the starter pack Start taking on: December 26, 2022   Cholecalciferol 50 MCG (2000 UT) Tabs Take 1 tablet by mouth daily.   docusate sodium 100 MG capsule Commonly known as: Colace Take 1 capsule (100 mg total) by mouth 2 (two) times daily as needed for mild constipation.   doxycycline 100 MG tablet Commonly known as: VIBRA-TABS Take 1 tablet (100 mg total) by mouth every 12 (twelve) hours.   FreeStyle [place] 2 Reader Hardie Pulley As directed   [employer] 2 Sensor Misc CHANGE EVERY 14 DAYS.   Insulin Lispro Prot & Lispro (75-25) 100 UNIT/ML Kwikpen Commonly known as: HumaLOG Mix 75/25 KwikPen Inject 100 Units into the skin 2 (two) times daily before a meal. What changed:  how much to take additional instructions   magnesium oxide 400 (240 Mg) MG tablet Commonly known  as: MAG-OX Take 1 tablet by mouth daily.   multivitamin tablet Take 1 tablet by mouth daily.   ondansetron  4 MG tablet Commonly known as: ZOFRAN Take 1 tablet (4 mg total) by mouth every 6 (six) hours as needed for nausea.   Ozempic (2 MG/DOSE) 8 MG/3ML Sopn Generic drug: Semaglutide (2 MG/DOSE) Inject 2 mg into the skin once a week.   Polyethylene Glycol 3350 Powd Take by mouth.   potassium chloride SA 20 MEQ tablet Commonly known as: KLOR-CON M Take 10 mEq by mouth every morning.   torsemide 20 MG tablet Commonly known as: DEMADEX Take 20 mg by mouth daily.          Time coordinating discharge: 45 min  Signed:  Joseph Art DO  [facility] 11/26/2022, 8:13 AM

## 2022-11-26 NOTE — Discharge Instructions (Addendum)
Information on my medicine - ELIQUIS (apixaban)  This medication education was reviewed with me or my healthcare representative as part of my discharge preparation.    Why was Eliquis prescribed for you? Eliquis was prescribed to treat blood clots that may have been found in the veins of your legs (deep vein thrombosis) or in your lungs (pulmonary embolism) and to reduce the risk of them occurring again.  What do You need to know about Eliquis ? The starting dose is 10 mg (two 5 mg tablets) taken TWICE daily for the FIRST SEVEN (7) DAYS, then on (enter date)  12/03/2022  the dose is reduced to ONE 5 mg tablet taken TWICE daily.  Eliquis may be taken with or without food.   Try to take the dose about the same time in the morning and in the evening. If you have difficulty swallowing the tablet whole please discuss with your pharmacist how to take the medication safely.  Take Eliquis exactly as prescribed and DO NOT stop taking Eliquis without talking to the doctor who prescribed the medication.  Stopping may increase your risk of developing a new blood clot.  Refill your prescription before you run out.  After discharge, you should have regular check-up appointments with your healthcare provider that is prescribing your Eliquis.    What do you do if you miss a dose? If a dose of ELIQUIS is not taken at the scheduled time, take it as soon as possible on the same day and twice-daily administration should be resumed. The dose should not be doubled to make up for a missed dose.  Important Safety Information A possible side effect of Eliquis is bleeding. You should call your healthcare provider right away if you experience any of the following: Bleeding from an injury or your nose that does not stop. Unusual colored urine (red or dark brown) or unusual colored stools (red or black). Unusual bruising for unknown reasons. A serious fall or if you hit your head (even if there is no  bleeding).  Some medicines may interact with Eliquis and might increase your risk of bleeding or clotting while on Eliquis. To help avoid this, consult your healthcare provider or pharmacist prior to using any new prescription or non-prescription medications, including herbals, vitamins, non-steroidal anti-inflammatory drugs (NSAIDs) and supplements.  This website has more information on Eliquis (apixaban): http://www.eliquis.com/eliquis/home

## 2022-11-26 NOTE — Plan of Care (Signed)

## 2022-11-29 ENCOUNTER — Telehealth: Payer: Self-pay | Admitting: "Endocrinology

## 2022-11-29 LAB — COMPREHENSIVE METABOLIC PANEL
ALT: 22 IU/L (ref 0–32)
AST: 26 IU/L (ref 0–40)
Albumin/Globulin Ratio: 1.2 (ref 1.2–2.2)
Albumin: 3.8 g/dL (ref 3.8–4.9)
Alkaline Phosphatase: 138 IU/L — ABNORMAL HIGH (ref 44–121)
BUN/Creatinine Ratio: 12 (ref 9–23)
BUN: 13 mg/dL (ref 6–24)
Bilirubin Total: <0.2 mg/dL (ref 0.0–1.2)
CO2: 22 mmol/L (ref 20–29)
Calcium: 9.5 mg/dL (ref 8.7–10.2)
Chloride: 102 mmol/L (ref 96–106)
Creatinine, Ser: 1.05 mg/dL — ABNORMAL HIGH (ref 0.57–1.00)
Globulin, Total: 3.1 g/dL (ref 1.5–4.5)
Glucose: 88 mg/dL (ref 70–99)
Potassium: 4.1 mmol/L (ref 3.5–5.2)
Sodium: 140 mmol/L (ref 134–144)
Total Protein: 6.9 g/dL (ref 6.0–8.5)
eGFR: 64 mL/min/{1.73_m2} (ref >59)

## 2022-11-29 LAB — TSH: TSH: 1.58 u[IU]/mL (ref 0.450–4.500)

## 2022-11-29 LAB — LIPID PANEL
Chol/HDL Ratio: 3.3 ratio (ref 0.0–4.4)
Cholesterol, Total: 278 mg/dL — ABNORMAL HIGH (ref 100–199)
HDL: 84 mg/dL (ref >39)
LDL Chol Calc (NIH): 173 mg/dL — ABNORMAL HIGH (ref 0–99)
Triglycerides: 123 mg/dL (ref 0–149)
VLDL Cholesterol Cal: 21 mg/dL (ref 5–40)

## 2022-11-29 LAB — T4, FREE: Free T4: 0.89 ng/dL (ref 0.82–1.77)

## 2022-11-29 NOTE — Telephone Encounter (Signed)
Maci RN with [facility] called to see if patient had a post hospital f/u with [place]. I will call and let her know

## 2022-12-07 ENCOUNTER — Telehealth: Payer: Self-pay

## 2022-12-07 NOTE — Telephone Encounter (Signed)
Macy RN with [facility] left a message requesting a return call regarding pt. Called but had to leave a message requesting Lindsey Vazquez to return call to the office

## 2022-12-14 ENCOUNTER — Encounter: Payer: Self-pay | Admitting: "Endocrinology

## 2022-12-14 ENCOUNTER — Ambulatory Visit: Payer: 59 | Admitting: "Endocrinology

## 2022-12-14 VITALS — BP 136/82 | HR 60 | Ht 61.0 in | Wt 230.6 lb

## 2022-12-14 DIAGNOSIS — I10 Essential (primary) hypertension: Secondary | ICD-10-CM | POA: Diagnosis not present

## 2022-12-14 DIAGNOSIS — Z7985 Long-term (current) use of injectable non-insulin antidiabetic drugs: Secondary | ICD-10-CM

## 2022-12-14 DIAGNOSIS — Z794 Long term (current) use of insulin: Secondary | ICD-10-CM | POA: Diagnosis not present

## 2022-12-14 DIAGNOSIS — E1165 Type 2 diabetes mellitus with hyperglycemia: Secondary | ICD-10-CM

## 2022-12-14 DIAGNOSIS — E782 Mixed hyperlipidemia: Secondary | ICD-10-CM

## 2022-12-14 DIAGNOSIS — Z6841 Body Mass Index (BMI) 40.0 and over, adult: Secondary | ICD-10-CM

## 2022-12-14 LAB — POCT GLYCOSYLATED HEMOGLOBIN (HGB A1C): HbA1c, POC (controlled diabetic range): 11.6 % — AB (ref 0.0–7.0)

## 2022-12-14 MED ORDER — INSULIN LISPRO PROT & LISPRO (75-25 MIX) 100 UNIT/ML KWIKPEN: 60.0000 [IU] | PEN_INJECTOR | Freq: Two times a day (BID) | SUBCUTANEOUS | 1 refills | Status: DC

## 2022-12-14 MED ORDER — SEMAGLUTIDE (1 MG/DOSE) 4 MG/3ML ~~LOC~~ SOPN: 1.0000 mg | PEN_INJECTOR | SUBCUTANEOUS | 1 refills | Status: DC

## 2022-12-14 NOTE — Patient Instructions (Signed)

## 2022-12-14 NOTE — Progress Notes (Signed)
12/14/2022, 6:43 PM                                                     Endocrinology follow-up note   Subjective:    Patient ID: Lindsey Vazquez, female    DOB: [birth year].  Lindsey Vazquez is being seen in follow-up for management of currently uncontrolled symptomatic type 2 diabetes, hyperlipidemia, vitamin D deficiency.  PCP:   Erasmo Downer, NP.   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/2021   Social History   Socioeconomic History   Marital status: Married    Spouse name: Not on file   Number of children: Not on file   Years of education: Not on file   Highest education level: Not on file  Occupational History   Not on file  Tobacco Use   Smoking status: Former    Types: Cigarettes    Quit date: 07/25/2015    Years since quitting: 7.3   Smokeless tobacco: Never  Vaping Use   Vaping Use: Former  Substance and Sexual Activity   Alcohol use: No    Comment: socially   Drug use: No   Sexual activity: Yes  Other Topics Concern   Not on file  Social History Narrative   Not on file   Social Determinants of Health   Financial Resource Strain: Not on file  Food Insecurity: No Food Insecurity (11/24/2022)   Hunger Vital Sign    Worried About Running Out of Food in the Last Year: Never true    Ran Out of Food in the Last Year: Never true  Transportation Needs: No Transportation Needs (11/24/2022)   PRAPARE - Administrator, Civil Service (Medical): No    Lack of Transportation (Non-Medical): No  Physical Activity: Not on file  Stress: Not on file  Social Connections: Not on file   Outpatient Encounter Medications as of 12/14/2022  Medication Sig   Semaglutide, 1 MG/DOSE, 4 MG/3ML  SOPN Inject 1 mg as directed once a week.   acetaminophen (TYLENOL) 500 MG tablet Take 1 tablet (500 mg total) by mouth every 6 (six) hours as needed for mild pain, fever or headache.   [START ON 12/26/2022] apixaban (ELIQUIS) 5 MG TABS tablet Take 1 tablet (5 mg total) by mouth 2 (two) times daily. To start after you finish the starter pack   APIXABAN (ELIQUIS) VTE STARTER PACK (10MG  AND 5MG ) Take as directed on package: start with two-5mg  tablets twice daily for 7 days. On day 8, switch to one-5mg  tablet twice daily.   Cholecalciferol 50 MCG (2000 UT) TABS Take 1 tablet by mouth daily.   Continuous Blood Gluc Receiver (FREESTYLE LIBRE 2 READER) DEVI As directed   Continuous Glucose Sensor (FREESTYLE LIBRE 2 SENSOR) MISC CHANGE EVERY 14 DAYS.   docusate sodium (COLACE) 100 MG capsule Take 1 capsule (100 mg total) by mouth 2 (two) times daily as needed for mild constipation.   doxycycline (VIBRA-TABS) 100 MG tablet Take 1 tablet (100  mg total) by mouth every 12 (twelve) hours.   Insulin Lispro Prot & Lispro (HUMALOG MIX 75/25 KWIKPEN) (75-25) 100 UNIT/ML Kwikpen Inject 60 Units into the skin 2 (two) times daily before a meal.   magnesium oxide (MAG-OX) 400 (240 Mg) MG tablet Take 1 tablet by mouth daily.   Multiple Vitamin (MULTIVITAMIN) tablet Take 1 tablet by mouth daily.   ondansetron (ZOFRAN) 4 MG tablet Take 1 tablet (4 mg total) by mouth every 6 (six) hours as needed for nausea.   Polyethylene Glycol 3350 POWD Take by mouth.   potassium chloride SA (KLOR-CON M) 20 MEQ tablet Take 10 mEq by mouth every morning.   torsemide (DEMADEX) 20 MG tablet Take 20 mg by mouth daily.   [DISCONTINUED] Insulin Lispro Prot & Lispro (HUMALOG MIX 75/25 KWIKPEN) (75-25) 100 UNIT/ML Kwikpen Inject 100 Units into the skin 2 (two) times daily before a meal. (Patient taking differently: Inject 50-100 Units into the skin 2 (two) times daily before a meal. Inject 100 units in the morning and 50 units every evening per  pt)   [DISCONTINUED] Semaglutide, 2 MG/DOSE, (OZEMPIC, 2 MG/DOSE,) 8 MG/3ML SOPN Inject 2 mg into the skin once a week. (Patient not taking: Reported on 12/14/2022)   No facility-administered encounter medications on file as of 12/14/2022.    ALLERGIES: Allergies  Allergen Reactions   Jardiance [Empagliflozin]    Metformin And Related Nausea And Vomiting    VACCINATION STATUS:  There is no immunization history on file for this patient.  Diabetes She presents for her follow-up diabetic visit. She has type 2 diabetes mellitus. Onset time: Diagnosed at approximate age of 68 years, after episode of gestational diabetes. Her disease course has been worsening. There are no hypoglycemic associated symptoms. Pertinent negatives for hypoglycemia include no confusion, headaches, pallor or seizures. Associated symptoms include foot paresthesias, polydipsia and polyuria. Pertinent negatives for diabetes include no chest pain and no fatigue. There are no hypoglycemic complications. Symptoms are worsening (Since her last visit, she was hospitalized for nausea/vomiting and was diagnosed with diabetes ketoacidosis.). Diabetic complications include nephropathy. Risk factors for coronary artery disease include diabetes mellitus, dyslipidemia, hypertension, family history, obesity, sedentary lifestyle and tobacco exposure. Current diabetic treatment includes insulin injections. Her weight is fluctuating minimally. She is following a generally unhealthy diet. When asked about meal planning, she reported none. She rarely participates in exercise. Her home blood glucose trend is decreasing steadily. Her breakfast blood glucose range is generally >200 mg/dl. Her lunch blood glucose range is generally >200 mg/dl. Her dinner blood glucose range is generally 180-200 mg/dl. Her bedtime blood glucose range is generally >200 mg/dl. Her overall blood glucose range is >200 mg/dl. (She presents with continued severe hyperglycemic  burden averaging 271 for the last 14 days.  Her AGP report shows 28% time in range, 13% level 1 hyperglycemia, 58% level 2 hyperglycemia.  She has no hypoglycemia.  Her point-of-care A1c is unchanged at 11.6%.  ) An ACE inhibitor/angiotensin II receptor blocker is not being taken.  Hyperlipidemia This is a chronic problem. The current episode started more than 1 year ago. The problem is uncontrolled. Exacerbating diseases include diabetes and obesity. Pertinent negatives include no chest pain, myalgias or shortness of breath. Current antihyperlipidemic treatment includes statins. Risk factors for coronary artery disease include dyslipidemia, diabetes mellitus, hypertension, family history, obesity and a sedentary lifestyle.  Hypertension This is a chronic problem. The current episode started more than 1 year ago. Pertinent negatives include no chest pain, headaches,  palpitations or shortness of breath. Risk factors for coronary artery disease include diabetes mellitus, dyslipidemia, sedentary lifestyle, smoking/tobacco exposure and obesity.    Review of systems  Constitutional: + Minimally fluctuating body weight,  current  Body mass index is 43.57 kg/m. , no fatigue, no subjective hyperthermia, no subjective hypothermia   Objective:    BP 136/82   Pulse 60   Ht 5\' 1"  (1.549 m)   Wt 230 lb 9.6 oz (104.6 kg)   BMI 43.57 kg/m   Wt Readings from Last 3 Encounters:  12/14/22 230 lb 9.6 oz (104.6 kg)  11/23/22 228 lb 8 oz (103.6 kg)  09/14/22 230 lb 3.2 oz (104.4 kg)      Physical Exam- Limited  Constitutional:  Body mass index is 43.57 kg/m. , not in acute distress, normal state of mind Abdomen: Skin exam is not convincing for the amount of insulin she is given to inject.   CMP     Component Value Date/Time   NA 140 11/28/2022 0824   K 4.1 11/28/2022 0824   CL 102 11/28/2022 0824   CO2 22 11/28/2022 0824   GLUCOSE 88 11/28/2022 0824   GLUCOSE 213 (H) 11/25/2022 0402   BUN 13  11/28/2022 0824   CREATININE 1.05 (H) 11/28/2022 0824   CREATININE 0.55 12/29/2019 1004   CALCIUM 9.5 11/28/2022 0824   PROT 6.9 11/28/2022 0824   ALBUMIN 3.8 11/28/2022 0824   AST 26 11/28/2022 0824   ALT 22 11/28/2022 0824   ALKPHOS 138 (H) 11/28/2022 0824   BILITOT <0.2 11/28/2022 0824   GFRNONAA >60 11/25/2022 0402   GFRNONAA 109 12/29/2019 1004   GFRAA 127 12/29/2019 1004   Lipid Panel     Component Value Date/Time   CHOL 278 (H) 11/28/2022 0824   TRIG 123 11/28/2022 0824   HDL 84 11/28/2022 0824   CHOLHDL 3.3 11/28/2022 0824   CHOLHDL 3.3 12/29/2019 1004   LDLCALC 173 (H) 11/28/2022 0824   LDLCALC 99 12/29/2019 1004   LABVLDL 21 11/28/2022 0824     Assessment & Plan:   1. Uncontrolled type 2 diabetes mellitus with hyperglycemia, CKD   - Lindsey Vazquez has currently uncontrolled symptomatic type 2 DM since 52 years of age.  She presents with continued severe hyperglycemic burden averaging 271 for the last 14 days.  Her AGP report shows 28% time in range, 13% level 1 hyperglycemia, 58% level 2 hyperglycemia.  She has no hypoglycemia.  Her point-of-care A1c is unchanged at 11.6%.  -She did not have  hypoglycemic episodes since last visit.   Recent labs reviewed, which also shows significant vitamin D deficiency.  -her diabetes is complicated by DKA, obesity, and sedentary life and she remains at a high risk for more acute and chronic complications which include CAD, CVA, CKD, retinopathy, and neuropathy. These are all discussed in detail with her.  - I have counseled her on diet management and weight loss, by adopting a carbohydrate restricted/protein rich diet. This patient has not engaged in lifestyle medicine.  She is in fact the best candidate for lifestyle medicine in light of the fact that she is losing control of diabetes despite recent bariatric surgery.  - she acknowledges that there is a room for improvement in her food and drink choices. - Suggestion is  made for her to avoid simple carbohydrates  from her diet including Cakes, Sweet Desserts, Ice Cream, Soda (diet and regular), Sweet Tea, Candies, Chips, Cookies, Store Bought Juices, Alcohol , Artificial Sweeteners,  Coffee Creamer, and "Sugar-free" Products, Lemonade. This will help patient to have more stable blood glucose profile and potentially avoid unintended weight gain.  The following Lifestyle Medicine recommendations according to American College of Lifestyle Medicine  [facility]) were discussed and and offered to patient and she  agrees to start the journey:  A. Whole Foods, Plant-Based Nutrition comprising of fruits and vegetables, plant-based proteins, whole-grain carbohydrates was discussed in detail with the patient.   A list for source of those nutrients were also provided to the patient.  Patient will use only water or unsweetened tea for hydration. B.  The need to stay away from risky substances including alcohol, smoking; obtaining 7 to 9 hours of restorative sleep, at least 150 minutes of moderate intensity exercise weekly, the importance of healthy social connections,  and stress management techniques were discussed. C.  A full color page of  Calorie density of various food groups per pound showing examples of each food groups was provided to the patient.    - she is advised to stick to a routine mealtimes to eat 3 meals  a day and avoid unnecessary snacks ( to snack only to correct hypoglycemia).   - I have approached her with the following individualized plan to manage diabetes and patient agrees:    -In light of her presentation with significantly above target glycemic profile she will continue to need intensive treatment with insulin.  However this patient did not execute basal/bolus insulin option on prior attempt.    -For simplicity reasons, she  was switched to premixed insulin Humalog 75/25 during her last visit.   She is hesitant to lower her blood glucose, insulin dose  adjustment will be made slowly.  She is advised to lower her insulin to 60 units daily at breakfast and 60 units daily at supper  for Premeal blood glucose readings above 90 mg per DL.   She is advised to stay off of metformin for now.  She is encouraged to continue to use her CGM continuously.  She took herself off of GLP-1 receptor agonist after her recent hospitalization for pneumonia.  She would benefit from resumption of this regimen.  I discussed and reinitiated Ozempic at 1 mg subcutaneously weekly.  - she is encouraged to call clinic for blood glucose levels less than 70 or above 200 mg /dl.   -Advised to stay away from SGLT2 inhibitors given her recent history of euglycemic DKA while taking Jardiance.   - Patient specific target  A1c;  LDL, HDL, Triglycerides were discussed in detail.  2) BP/HTN:  -Her blood pressure is controlled to target.    she is currently not taking any blood pressure medications.    3) Lipids/HPL: She has not engaged with lifestyle medicine nutrition.  Her LDL is increasing to 173 from 99.  Her atorvastatin is not on her medication list.  She is reminded of atorvastatin 20 mg p.o. nightly.  She may need intervention with Repatha on subsequent visits.   Side effects and precautions discussed with her.  4) vitamin  D deficiency- 25 hydroxy vitamin D remains low despite a course of treatment with vitamin D3 5000 units for 90 days .she is approached for retreatment with vitamin D3 5000 units daily for the next 90 days.    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   6) Chronic Care/Health Maintenance:  -she  is on Statin medications and  is encouraged  to initiate and continue to follow up with Ophthalmology, Dentist, nephrology, podiatrist at least yearly or according to recommendations, and advised to   stay away from smoking. I have recommended yearly flu vaccine and pneumonia vaccine at least every 5 years;  moderate intensity exercise for up to 150 minutes weekly; and  sleep for at least 7 hours a day.  - I advised patient to maintain close follow up with Erasmo Downer, NP for primary care needs.   I spent  26  minutes in the care of the patient today including review of labs from CMP, Lipids, Thyroid Function, Hematology (current and previous including abstractions from other facilities); face-to-face time discussing  her blood glucose readings/logs, discussing hypoglycemia and hyperglycemia episodes and symptoms, medications doses, her options of short and long term treatment based on the latest standards of care / guidelines;  discussion about incorporating lifestyle medicine;  and documenting the encounter. Risk reduction counseling performed per USPSTF guidelines to reduce  obesity and cardiovascular risk factors.     Please refer to Patient Instructions for Blood Glucose Monitoring and Insulin/Medications Dosing Guide"  in media tab for additional information. Please  also refer to " Patient Self Inventory" in the Media  tab for reviewed elements of pertinent patient history.  Lindsey Vazquez participated in the discussions, expressed understanding, and voiced agreement with the above plans.  All questions were answered to her satisfaction. she is encouraged to contact clinic should she have any questions or concerns prior to her return visit.    Follow up plan: - Return in about 4 weeks (around 01/11/2023) for F/U with Meter/CGM /Logs Only - no Labs.  Marquis Lunch, MD [facility] [address] [place], [place] [address] Phone: [phone]  Fax: [phone]    12/14/2022, 6:43 PM  This note was partially dictated with voice recognition software. Similar sounding words can be transcribed inadequately or may not  be corrected upon review.

## 2023-01-11 ENCOUNTER — Ambulatory Visit: Payer: 59 | Admitting: "Endocrinology

## 2023-03-19 ENCOUNTER — Other Ambulatory Visit: Payer: Self-pay | Admitting: "Endocrinology

## 2023-07-07 ENCOUNTER — Other Ambulatory Visit: Payer: Self-pay | Admitting: "Endocrinology

## 2023-07-11 ENCOUNTER — Telehealth: Payer: Self-pay | Admitting: "Endocrinology

## 2023-07-11 ENCOUNTER — Other Ambulatory Visit: Payer: Self-pay | Admitting: *Deleted

## 2023-07-11 MED ORDER — FREESTYLE LIBRE 2 SENSOR MISC: 6.0000 | 0 refills | Status: DC

## 2023-07-11 NOTE — Telephone Encounter (Signed)
Patient is asking if she can have her sensors called into [facility]. She made an appt for January 23.

## 2023-07-11 NOTE — Telephone Encounter (Signed)
A prescription was done, it printed and Whitney will need to sign, tomorrow.Marland Kitchen

## 2023-08-02 ENCOUNTER — Ambulatory Visit
Admission: EM | Admit: 2023-08-02 | Discharge: 2023-08-02 | Disposition: A | Discharge status: Home / Self Care | Payer: 59 | Attending: Nurse Practitioner | Admitting: Nurse Practitioner

## 2023-08-02 ENCOUNTER — Emergency Department (HOSPITAL_COMMUNITY)
Admission: EM | Admit: 2023-08-02 | Discharge: 2023-08-02 | Disposition: A | Discharge status: Home / Self Care | Payer: 59 | Attending: Emergency Medicine | Admitting: Emergency Medicine

## 2023-08-02 ENCOUNTER — Other Ambulatory Visit: Payer: Self-pay

## 2023-08-02 ENCOUNTER — Encounter (HOSPITAL_COMMUNITY): Payer: Self-pay | Admitting: Emergency Medicine

## 2023-08-02 ENCOUNTER — Encounter: Payer: Self-pay | Admitting: Emergency Medicine

## 2023-08-02 DIAGNOSIS — Z794 Long term (current) use of insulin: Secondary | ICD-10-CM | POA: Insufficient documentation

## 2023-08-02 DIAGNOSIS — R112 Nausea with vomiting, unspecified: Secondary | ICD-10-CM

## 2023-08-02 DIAGNOSIS — Z7901 Long term (current) use of anticoagulants: Secondary | ICD-10-CM | POA: Diagnosis not present

## 2023-08-02 DIAGNOSIS — R739 Hyperglycemia, unspecified: Secondary | ICD-10-CM

## 2023-08-02 DIAGNOSIS — I10 Essential (primary) hypertension: Secondary | ICD-10-CM | POA: Diagnosis not present

## 2023-08-02 DIAGNOSIS — R1110 Vomiting, unspecified: Secondary | ICD-10-CM | POA: Diagnosis not present

## 2023-08-02 DIAGNOSIS — R109 Unspecified abdominal pain: Secondary | ICD-10-CM

## 2023-08-02 DIAGNOSIS — R197 Diarrhea, unspecified: Secondary | ICD-10-CM | POA: Diagnosis not present

## 2023-08-02 DIAGNOSIS — Z8639 Personal history of other endocrine, nutritional and metabolic disease: Secondary | ICD-10-CM

## 2023-08-02 DIAGNOSIS — E1165 Type 2 diabetes mellitus with hyperglycemia: Secondary | ICD-10-CM | POA: Insufficient documentation

## 2023-08-02 DIAGNOSIS — K529 Noninfective gastroenteritis and colitis, unspecified: Secondary | ICD-10-CM

## 2023-08-02 LAB — CBC WITH DIFFERENTIAL/PLATELET
Abs Immature Granulocytes: 0.06 10*3/uL (ref 0.00–0.07)
Basophils Absolute: 0 10*3/uL (ref 0.0–0.1)
Basophils Relative: 0 %
Eosinophils Absolute: 0 10*3/uL (ref 0.0–0.5)
Eosinophils Relative: 0 %
HCT: 42.5 % (ref 36.0–46.0)
Hemoglobin: 13.8 g/dL (ref 12.0–15.0)
Immature Granulocytes: 1 %
Lymphocytes Relative: 4 %
Lymphs Abs: 0.4 10*3/uL — ABNORMAL LOW (ref 0.7–4.0)
MCH: 29.1 pg (ref 26.0–34.0)
MCHC: 32.5 g/dL (ref 30.0–36.0)
MCV: 89.5 fL (ref 80.0–100.0)
Monocytes Absolute: 0.9 10*3/uL (ref 0.1–1.0)
Monocytes Relative: 7 %
Neutro Abs: 10.8 10*3/uL — ABNORMAL HIGH (ref 1.7–7.7)
Neutrophils Relative %: 88 %
Platelets: 246 10*3/uL (ref 150–400)
RBC: 4.75 MIL/uL (ref 3.87–5.11)
RDW: 13.1 % (ref 11.5–15.5)
WBC: 12.3 10*3/uL — ABNORMAL HIGH (ref 4.0–10.5)
nRBC: 0 % (ref 0.0–0.2)

## 2023-08-02 LAB — BETA-HYDROXYBUTYRIC ACID: Beta-Hydroxybutyric Acid: 0.72 mmol/L — ABNORMAL HIGH (ref 0.05–0.27)

## 2023-08-02 LAB — COMPREHENSIVE METABOLIC PANEL
ALT: 21 U/L (ref 0–44)
AST: 27 U/L (ref 15–41)
Albumin: 3.2 g/dL — ABNORMAL LOW (ref 3.5–5.0)
Alkaline Phosphatase: 139 U/L — ABNORMAL HIGH (ref 38–126)
Anion gap: 14 (ref 5–15)
BUN: 26 mg/dL — ABNORMAL HIGH (ref 6–20)
CO2: 24 mmol/L (ref 22–32)
Calcium: 8.7 mg/dL — ABNORMAL LOW (ref 8.9–10.3)
Chloride: 92 mmol/L — ABNORMAL LOW (ref 98–111)
Creatinine, Ser: 0.97 mg/dL (ref 0.44–1.00)
GFR, Estimated: >60 mL/min (ref >60)
Glucose, Bld: 394 mg/dL — ABNORMAL HIGH (ref 70–99)
Potassium: 4 mmol/L (ref 3.5–5.1)
Sodium: 130 mmol/L — ABNORMAL LOW (ref 135–145)
Total Bilirubin: 1.1 mg/dL (ref 0.0–1.2)
Total Protein: 7.3 g/dL (ref 6.5–8.1)

## 2023-08-02 LAB — POCT INFLUENZA A/B
Influenza A, POC: NEGATIVE
Influenza B, POC: NEGATIVE

## 2023-08-02 LAB — CBG MONITORING, ED
Glucose-Capillary: 424 mg/dL — ABNORMAL HIGH (ref 70–99)
Glucose-Capillary: 430 mg/dL — ABNORMAL HIGH (ref 70–99)
Glucose-Capillary: 392 mg/dL — ABNORMAL HIGH (ref 70–99)
Glucose-Capillary: 341 mg/dL — ABNORMAL HIGH (ref 70–99)
Glucose-Capillary: 344 mg/dL — ABNORMAL HIGH (ref 70–99)

## 2023-08-02 LAB — POCT URINALYSIS DIP (MANUAL ENTRY)
Bilirubin, UA: NEGATIVE
Blood, UA: NEGATIVE
Glucose, UA: >=1000 mg/dL — AB
Leukocytes, UA: NEGATIVE
Nitrite, UA: NEGATIVE
Protein Ur, POC: 100 mg/dL — AB
Spec Grav, UA: 1.025
Urobilinogen, UA: 0.2 U/dL
pH, UA: 5.5

## 2023-08-02 LAB — POCT FASTING CBG KUC MANUAL ENTRY: POCT Glucose (KUC): 284 mg/dL — AB (ref 70–99)

## 2023-08-02 MED ORDER — INSULIN ASPART 100 UNIT/ML IJ SOLN
10.0000 [IU] | Freq: Once | INTRAMUSCULAR | Status: AC
  Administered 2023-08-02: 10 [IU] via SUBCUTANEOUS
  Filled 2023-08-02: qty 1

## 2023-08-02 MED ORDER — ONDANSETRON 4 MG PO TBDP
4.0000 mg | ORAL_TABLET | Freq: Once | ORAL | Status: AC
  Administered 2023-08-02: 4 mg via ORAL

## 2023-08-02 MED ORDER — SODIUM CHLORIDE 0.9 % IV BOLUS
2000.0000 mL | Freq: Once | INTRAVENOUS | Status: AC
  Administered 2023-08-02: 2000 mL via INTRAVENOUS

## 2023-08-02 MED ORDER — ACETAMINOPHEN 325 MG PO TABS
650.0000 mg | ORAL_TABLET | Freq: Once | ORAL | Status: AC
  Administered 2023-08-02: 650 mg via ORAL
  Filled 2023-08-02: qty 2

## 2023-08-02 MED ORDER — ONDANSETRON 4 MG PO TBDP: ORAL_TABLET | ORAL | 0 refills | Status: DC

## 2023-08-02 MED ORDER — KETOROLAC TROMETHAMINE 30 MG/ML IJ SOLN
30.0000 mg | Freq: Once | INTRAMUSCULAR | Status: AC
  Administered 2023-08-02: 30 mg via INTRAVENOUS
  Filled 2023-08-02: qty 1

## 2023-08-02 MED ORDER — PANTOPRAZOLE SODIUM 40 MG IV SOLR
40.0000 mg | Freq: Once | INTRAVENOUS | Status: AC
  Administered 2023-08-02: 40 mg via INTRAVENOUS
  Filled 2023-08-02: qty 10

## 2023-08-02 MED ORDER — LACTATED RINGERS IV BOLUS
1000.0000 mL | Freq: Once | INTRAVENOUS | Status: AC
  Administered 2023-08-02: 1000 mL via INTRAVENOUS

## 2023-08-02 MED ORDER — ONDANSETRON HCL 4 MG/2ML IJ SOLN
4.0000 mg | Freq: Once | INTRAMUSCULAR | Status: AC
  Administered 2023-08-02: 4 mg via INTRAVENOUS
  Filled 2023-08-02: qty 2

## 2023-08-02 NOTE — ED Triage Notes (Signed)
 Vomiting and diarrhea since last night.  States blood sugar was high last night.

## 2023-08-02 NOTE — ED Provider Notes (Signed)
  Physical Exam  BP 128/70   Pulse (!) 108   Temp 99.4 F (37.4 C) (Oral)   Resp 18   Ht 5' 1 (1.549 m)   Wt 101.2 kg   SpO2 97%   BMI 42.14 kg/m   Physical Exam  Procedures  Procedures  ED Course / MDM   Clinical Course as of 08/03/23 1748  Thu Aug 02, 2023  1557 Assumed care from Dr. Zammit. 53 yo M with hx DM with vomiting and diarrhea. Referred in for DKA eval. Not in DKA. Given 2L NS and 10 units of aspart. Given rx of zofran .  [RP]  1808 Re-examined. Tolerating PO. Repeat CBG improved.  Patient requesting go home.  Will take half dose of her insulin  tonight and follow-up with her primary doctor in several days [RP]    Clinical Course User Index [RP] Yolande Lamar BROCKS, MD   Medical Decision Making Risk OTC drugs. Prescription drug management.      Yolande Lamar BROCKS, MD 08/03/23 [phone]

## 2023-08-02 NOTE — ED Provider Notes (Signed)
 [facility]    CSN: [id number] Arrival date & time: 08/02/23  0846      History   Chief Complaint No chief complaint on file.   HPI Lindsey Vazquez is a 53 y.o. female.   The history is provided by the patient.   Patient presents for complaints of nausea, vomiting, and diarrhea that started last evening.  Patient reports at least 10-15 episodes of nausea with vomiting, and 7-8 episodes of diarrhea.  States that her last episode of vomiting was while she was here at this appointment.  She denies fever, chills, nasal congestion, runny nose, cough, constipation, bloody stools, or urinary symptoms.  Patient reports that she is diabetic, blood glucose was 290 last evening.  She states over the past 3 days, her blood sugar has been elevated, with the highest being around 312.  Per review of chart, patient does have history of DKA.  She is currently on Eliquis  pulmonary embolism.  Past Medical History:  Diagnosis Date   Diabetes mellitus without complication (HCC)    Hyperlipemia    Hypertension     Patient Active Problem List   Diagnosis Date Noted   Pulmonary embolism (HCC) 11/23/2022   CAP (community acquired pneumonia) 11/23/2022   Acute hypoxic respiratory failure (HCC) 11/23/2022   Non-adherence to medical treatment 03/15/2022   Diarrhea 10/28/2021   Intractable nausea and vomiting    DKA (diabetic ketoacidosis) (HCC) 09/10/2021   Morbid obesity (HCC) 09/10/2021   Paroxysmal atrial fibrillation (HCC) 09/10/2021   Gastritis 09/10/2021   OSA (obstructive sleep apnea) 01/14/2021   Other hypersomnia 01/14/2021   Hypercalcemia 01/04/2021   Vitamin D  deficiency 05/23/2018   Uncontrolled type 2 diabetes mellitus with hyperglycemia (HCC) 04/15/2018   Essential hypertension, benign 04/15/2018   Mixed hyperlipidemia 04/15/2018   MPN (myeloproliferative neoplasm) (HCC) 01/22/2018   Diabetes (HCC) 02/07/2017   Leukocytosis 02/07/2017    Past Surgical History:   Procedure Laterality Date   c-section     CARDIAC CATHETERIZATION  07/2021   LAPAROSCOPIC GASTRIC SLEEVE RESECTION  08/11/2021    OB History   No obstetric history on file.      Home Medications    Prior to Admission medications   Medication Sig Start Date End Date Taking? Authorizing Provider  acetaminophen  (TYLENOL ) 500 MG tablet Take 1 tablet (500 mg total) by mouth every 6 (six) hours as needed for mild pain, fever or headache. 09/12/21   Ricky Fines, MD  apixaban  (ELIQUIS ) 5 MG TABS tablet Take 1 tablet (5 mg total) by mouth 2 (two) times daily. To start after you finish the starter pack 12/26/22   Vann, Jessica U, DO  APIXABAN  (ELIQUIS ) VTE STARTER PACK (10MG  AND 5MG ) Take as directed on package: start with two-5mg  tablets twice daily for 7 days. On day 8, switch to one-5mg  tablet twice daily. 11/26/22   Vann, Jessica U, DO  atorvastatin  (LIPITOR) 20 MG tablet Take 20 mg by mouth daily.    [provider]  Cholecalciferol 50 MCG (2000 UT) TABS Take 1 tablet by mouth daily.    [provider]  Continuous Blood Gluc Receiver (FREESTYLE LIBRE 2 READER) DEVI As directed 09/01/21   Nida, Gebreselassie W, MD  Continuous Glucose Sensor (FREESTYLE LIBRE 2 SENSOR) MISC 6 each by Other route every 14 (fourteen) days. 07/11/23   Therisa Benton PARAS, NP  docusate sodium  (COLACE) 100 MG capsule Take 1 capsule (100 mg total) by mouth 2 (two) times daily as needed for mild constipation.  09/12/21   Ricky Fines, MD  Insulin  Lispro Prot & Lispro (HUMALOG  MIX 75/25 KWIKPEN) (75-25) 100 UNIT/ML Kwikpen Inject 60 Units into the skin 2 (two) times daily before a meal. 12/14/22   Nida, Ethelle ORN, MD  magnesium  oxide (MAG-OX) 400 (240 Mg) MG tablet Take 1 tablet by mouth daily. 06/27/21   [provider]  Multiple Vitamin (MULTIVITAMIN) tablet Take 1 tablet by mouth daily.    [provider]  ondansetron  (ZOFRAN ) 4 MG tablet Take 1 tablet (4 mg total) by mouth every 6  (six) hours as needed for nausea. 11/26/22   Vann, Jessica U, DO  Polyethylene Glycol 3350  POWD Take by mouth. 08/31/21   [provider]  potassium chloride  SA (KLOR-CON  M) 20 MEQ tablet Take 10 mEq by mouth every morning.    [provider]  Semaglutide , 1 MG/DOSE, 4 MG/3ML SOPN Inject 1 mg as directed once a week. 12/14/22   Nida, Gebreselassie W, MD  torsemide (DEMADEX) 20 MG tablet Take 20 mg by mouth daily.    [provider]    Family History History reviewed. No pertinent family history.  Social History Social History   Tobacco Use   Smoking status: Former    Current packs/day: 0.00    Types: Cigarettes    Quit date: 07/25/2015    Years since quitting: 8.0   Smokeless tobacco: Never  Vaping Use   Vaping status: Former  Substance Use Topics   Alcohol use: No    Comment: socially   Drug use: No     Allergies   Jardiance [empagliflozin] and Metformin  and related   Review of Systems Review of Systems Per HPI  Physical Exam Triage Vital Signs ED Triage Vitals  Encounter Vitals Group     BP 08/02/23 0904 139/77     Systolic BP Percentile --      Diastolic BP Percentile --      Pulse Rate 08/02/23 0904 (!) 130     Resp 08/02/23 0904 20     Temp 08/02/23 0904 99 F (37.2 C)     Temp Source 08/02/23 0904 Oral     SpO2 08/02/23 0904 96 %     Weight --      Height --      Head Circumference --      Peak Flow --      Pain Score 08/02/23 0905 8     Pain Loc --      Pain Education --      Exclude from Growth Chart --    No data found.  Updated Vital Signs BP 139/77 (BP Location: Right Arm)   Pulse (!) 130   Temp 99 F (37.2 C) (Oral)   Resp 20   SpO2 96%   Visual Acuity Right Eye Distance:   Left Eye Distance:   Bilateral Distance:    Right Eye Near:   Left Eye Near:    Bilateral Near:     Physical Exam Vitals and nursing note reviewed.  Constitutional:      General: She is not in acute distress.    Appearance: Normal  appearance.  HENT:     Head: Normocephalic.     Right Ear: Tympanic membrane, ear canal and external ear normal.     Left Ear: Tympanic membrane, ear canal and external ear normal.     Nose: Nose normal.     Right Turbinates: Enlarged and swollen.     Left Turbinates: Enlarged and swollen.  Eyes:     Extraocular Movements: Extraocular movements intact.     Pupils: Pupils are equal, round, and reactive to light.  Cardiovascular:     Rate and Rhythm: Tachycardia present.     Pulses: Normal pulses.     Heart sounds: Normal heart sounds.  Pulmonary:     Effort: Pulmonary effort is normal. No respiratory distress.     Breath sounds: Normal breath sounds. No stridor. No wheezing, rhonchi or rales.  Abdominal:     General: Bowel sounds are normal.     Palpations: Abdomen is soft.     Tenderness: There is abdominal tenderness.  Musculoskeletal:     Cervical back: Normal range of motion.  Skin:    General: Skin is warm and dry.  Neurological:     General: No focal deficit present.     Mental Status: She is alert and oriented to person, place, and time.  Psychiatric:        Mood and Affect: Mood normal.        Behavior: Behavior normal.      UC Treatments / Results  Labs (all labs ordered are listed, but only abnormal results are displayed) Labs Reviewed  POCT FASTING CBG [facility] MANUAL ENTRY - Abnormal; Notable for the following components:      Result Value   POCT Glucose ([facility]) 284 (*)    All other components within normal limits  POCT URINALYSIS DIP (MANUAL ENTRY) - Abnormal; Notable for the following components:   Glucose, UA >=1,000 (*)    Ketones, POC UA small (15) (*)    Protein Ur, POC =100 (*)    All other components within normal limits  POCT INFLUENZA A/B - Normal    EKG   Radiology No results found.  Procedures Procedures (including critical care time)  Medications Ordered in UC Medications  ondansetron  (ZOFRAN -ODT) disintegrating tablet 4 mg (4 mg Oral  Given 08/02/23 0908)    Initial Impression / Assessment and Plan / UC Course  I have reviewed the triage vital signs and the nursing notes.  Pertinent labs & imaging results that were available during my care of the patient were reviewed by me and considered in my medical decision making (see chart for details).  Patient presents after sudden onset of nausea, vomiting, and diarrhea that started over the past 24 hours.  Patient reported that she has vomited at least 10-15 times along with 7-8 episodes of diarrhea.  On exam, she is tachycardic, and has right lower quadrant and right upper quadrant tenderness.  Zofran  4 mg ODT administered with p.o. trial.  Patient was able to tolerate liquids after Zofran .  Reports that she continues to feel bad.  Given patient's current presentation, sudden onset of nausea, vomiting, and diarrhea, feel patient needs further workup to include possible lab work or imaging.  Discussed recommendations with the patient and her daughter, and advised it is recommended that patient be seen in the emergency department for further evaluation.  Patient's vital signs are mostly stable, feel she is able to travel via private vehicle.  Patient's daughter is present, and is able to transport patient to the emergency department.  Patient ambulatory at discharge.   Final Clinical Impressions(s) / UC Diagnoses   Final diagnoses:  Abdominal pain, unspecified abdominal location  Nausea vomiting and diarrhea  History of diabetic ketoacidosis     Discharge Instructions      Go to the emergency department for further evaluation.     ED Prescriptions  None    PDMP not reviewed this encounter.   Gilmer Etta PARAS, NP 08/02/23 1037

## 2023-08-02 NOTE — ED Notes (Signed)
Patient able to keep water down. 

## 2023-08-02 NOTE — ED Provider Triage Note (Signed)
 Emergency Medicine Provider Triage Evaluation Note  Lindsey Vazquez , a 53 y.o. female  was evaluated in triage.  Pt complains of nausea vomiting diarrhea x 24 hours, went to urgent care and had high blood sugar, sent to rule out DKA.  Has not had any insulin  or any of her regular medicines today..  Review of Systems  Positive: Nausea vomiting diarrhea Negative:  Fever Physical Exam  BP (!) 148/69 (BP Location: Right Arm)   Pulse (!) 122   Temp 99.6 F (37.6 C) (Oral)   Resp 20   Ht 5' 1 (1.549 m)   Wt 101.2 kg   SpO2 99%   BMI 42.14 kg/m  Gen:   Awake, no distress   Resp:  Normal effort  MSK:   Moves extremities without difficulty  Other:    Medical Decision Making  Medically screening exam initiated at 12:15 PM.  Appropriate orders placed.  P[facility] Lindsey Vazquez was informed that the remainder of the evaluation will be completed by another provider, this initial triage assessment does not replace that evaluation, and the importance of remaining in the ED until their evaluation is complete.     Lindsey Vazquez LABOR, [place] 08/02/23 1216

## 2023-08-02 NOTE — ED Triage Notes (Signed)
 Pt c/o of n/v/d since last night. Pt is diabetic. Went to urgent care and she was instructed to come here for possible DKA.

## 2023-08-02 NOTE — ED Provider Notes (Signed)
 [place] [facility] AT [facility] Provider Note   CSN: [id number] Arrival date & time: 08/02/23  1015     History  Chief Complaint  Patient presents with   Hyperglycemia    Lindsey Vazquez is a 53 y.o. female.  Patient has a history of diabetes.  She has been vomiting and having diarrhea since yesterday.  She also has hypertension  The history is provided by the patient and medical records.  Emesis Severity:  Moderate Timing:  Constant Quality:  Bilious material Able to tolerate:  Liquids Progression:  Worsening Chronicity:  New Recent urination:  Normal Relieved by:  Nothing Worsened by:  Nothing Ineffective treatments:  None tried Associated symptoms: diarrhea   Associated symptoms: no abdominal pain, no cough and no headaches        Home Medications Prior to Admission medications   Medication Sig Start Date End Date Taking? Authorizing Provider  acetaminophen  (TYLENOL ) 500 MG tablet Take 1 tablet (500 mg total) by mouth every 6 (six) hours as needed for mild pain, fever or headache. 09/12/21  Yes Ricky Fines, MD  apixaban  (ELIQUIS ) 5 MG TABS tablet Take 1 tablet (5 mg total) by mouth 2 (two) times daily. To start after you finish the starter pack 12/26/22  Yes Vann, Jessica U, DO  atorvastatin  (LIPITOR) 80 MG tablet Take 80 mg by mouth daily.   Yes [provider]  Cholecalciferol 50 MCG (2000 UT) TABS Take 1 tablet by mouth daily.   Yes [provider]  CLINDESSE vaginal cream once. Apply 1 application vaginally every night 07/23/23  Yes [provider]  Insulin  Lispro Prot & Lispro (HUMALOG  MIX 75/25 KWIKPEN) (75-25) 100 UNIT/ML Kwikpen Inject 60 Units into the skin 2 (two) times daily before a meal. 12/14/22  Yes Nida, Ethelle ORN, MD  magnesium  oxide (MAG-OX) 400 (240 Mg) MG tablet Take 1 tablet by mouth daily. 06/27/21  Yes [provider]  metoprolol  succinate (TOPROL -XL) 50 MG 24 hr tablet Take 50  mg by mouth daily.   Yes [provider]  Multiple Vitamin (MULTIVITAMIN) tablet Take 1 tablet by mouth daily.   Yes [provider]  potassium chloride  SA (KLOR-CON  M) 20 MEQ tablet Take 10 mEq by mouth every morning.   Yes [provider]  Semaglutide , 1 MG/DOSE, 4 MG/3ML SOPN Inject 1 mg as directed once a week. 12/14/22  Yes Nida, Gebreselassie W, MD  telmisartan (MICARDIS) 20 MG tablet Take 20 mg by mouth every other day.   Yes [provider]  torsemide (DEMADEX) 20 MG tablet Take 20 mg by mouth daily.   Yes [provider]  Continuous Blood Gluc Receiver (FREESTYLE LIBRE 2 READER) DEVI As directed 09/01/21   Nida, Gebreselassie W, MD  Continuous Glucose Sensor (FREESTYLE LIBRE 2 SENSOR) MISC 6 each by Other route every 14 (fourteen) days. 07/11/23   Therisa Benton PARAS, NP      Allergies    Jardiance [empagliflozin] and Metformin  and related    Review of Systems   Review of Systems  Constitutional:  Negative for appetite change and fatigue.  HENT:  Negative for congestion, ear discharge and sinus pressure.   Eyes:  Negative for discharge.  Respiratory:  Negative for cough.   Cardiovascular:  Negative for chest pain.  Gastrointestinal:  Positive for diarrhea and vomiting. Negative for abdominal pain.  Genitourinary:  Negative for frequency and hematuria.  Musculoskeletal:  Negative for back pain.  Skin:  Negative for rash.  Neurological:  Negative for seizures and headaches.  Psychiatric/Behavioral:  Negative for hallucinations.     Physical Exam Updated Vital Signs BP 128/70   Pulse (!) 108   Temp 99.4 F (37.4 C) (Oral)   Resp 18   Ht 5' 1 (1.549 m)   Wt 101.2 kg   SpO2 97%   BMI 42.14 kg/m  Physical Exam Vitals and nursing note reviewed.  Constitutional:      Appearance: She is well-developed.  HENT:     Head: Normocephalic.     Nose: Nose normal.  Eyes:     General: No scleral icterus.    Conjunctiva/sclera:  Conjunctivae normal.  Neck:     Thyroid: No thyromegaly.  Cardiovascular:     Rate and Rhythm: Normal rate and regular rhythm.     Heart sounds: No murmur heard.    No friction rub. No gallop.  Pulmonary:     Breath sounds: No stridor. No wheezing or rales.  Chest:     Chest wall: No tenderness.  Abdominal:     General: There is no distension.     Tenderness: There is no abdominal tenderness. There is no rebound.  Musculoskeletal:        General: Normal range of motion.     Cervical back: Neck supple.  Lymphadenopathy:     Cervical: No cervical adenopathy.  Skin:    Findings: No erythema or rash.  Neurological:     Mental Status: She is oriented to person, place, and time.     Motor: No abnormal muscle tone.     Coordination: Coordination normal.  Psychiatric:        Behavior: Behavior normal.     ED Results / Procedures / Treatments   Labs (all labs ordered are listed, but only abnormal results are displayed) Labs Reviewed  CBC WITH DIFFERENTIAL/PLATELET - Abnormal; Notable for the following components:      Result Value   WBC 12.3 (*)    Neutro Abs 10.8 (*)    Lymphs Abs 0.4 (*)    All other components within normal limits  COMPREHENSIVE METABOLIC PANEL - Abnormal; Notable for the following components:   Sodium 130 (*)    Chloride 92 (*)    Glucose, Bld 394 (*)    BUN 26 (*)    Calcium  8.7 (*)    Albumin 3.2 (*)    Alkaline Phosphatase 139 (*)    All other components within normal limits  BETA-HYDROXYBUTYRIC ACID - Abnormal; Notable for the following components:   Beta-Hydroxybutyric Acid 0.72 (*)    All other components within normal limits  CBG MONITORING, ED - Abnormal; Notable for the following components:   Glucose-Capillary 424 (*)    All other components within normal limits  CBG MONITORING, ED - Abnormal; Notable for the following components:   Glucose-Capillary 430 (*)    All other components within normal limits  URINALYSIS, ROUTINE W REFLEX  MICROSCOPIC    EKG None  Radiology No results found.  Procedures Procedures    Medications Ordered in ED Medications  insulin  aspart (novoLOG ) injection 10 Units (has no administration in time range)  ondansetron  (ZOFRAN ) injection 4 mg (4 mg Intravenous Given 08/02/23 1316)  lactated ringers  bolus 1,000 mL (0 mLs Intravenous Stopped 08/02/23 1528)  sodium chloride  0.9 % bolus 2,000 mL (2,000 mLs Intravenous New Bag/Given 08/02/23 1527)  pantoprazole  (PROTONIX ) injection 40 mg (40 mg Intravenous Given 08/02/23 1316)  ketorolac  (TORADOL ) 30 MG/ML injection 30 mg (30 mg  Intravenous Given 08/02/23 1316)  acetaminophen  (TYLENOL ) tablet 650 mg (650 mg Oral Given 08/02/23 1315)    ED Course/ Medical Decision Making/ A&P Clinical Course as of 08/06/23 1054  Thu Aug 02, 2023  1557 Assumed care from Dr. Ulmer Degen. 53 yo M with hx DM with vomiting and diarrhea. Referred in for DKA eval. Not in DKA. Given 2L NS and 10 units of aspart. Given rx of zofran .  [RP]  1808 Re-examined. Tolerating PO. Repeat CBG improved.  Patient requesting go home.  Will take half dose of her insulin  tonight and follow-up with her primary doctor in several days [RP]    Clinical Course User Index [RP] Yolande Lamar BROCKS, MD                                 Medical Decision Making Risk OTC drugs. Prescription drug management.  This patient presents to the ED for concern of vomiting and diarrhea, this involves an extensive number of treatment options, and is a complaint that carries with it a high risk of complications and morbidity.  The differential diagnosis includes DKA and gastroenteritis   Co morbidities that complicate the patient evaluation  Diabetes   Additional history obtained:  Additional history obtained from patient External records from outside source obtained and reviewed including hospital record   Lab Tests:  I Ordered, and personally interpreted labs.  The pertinent results include: Glucose  424   Imaging Studies ordered:  No imaging Cardiac Monitoring: / EKG:  The patient was maintained on a cardiac monitor.  I personally viewed and interpreted the cardiac monitored which showed an underlying rhythm of: Normal sinus rhythm   Consultations Obtained:  No consultant  Problem List / ED Course / Critical interventions / Medication management  Diabetes and vomiting I ordered medication including normal saline and Zofran  Reevaluation of the patient after these medicines showed that the patient improved I have reviewed the patients home medicines and have made adjustments as needed   Social Determinants of Health:  None   Test / Admission - Considered:  None  Patient with gastroenteritis and hyperglycemia.  She will be discharged home with Zofran  and follow-up with her doctor next week        Final Clinical Impression(s) / ED Diagnoses Final diagnoses:  None    Rx / DC Orders ED Discharge Orders     None         Suzette Pac, MD 08/06/23 1056

## 2023-08-02 NOTE — Discharge Instructions (Addendum)
 Take liquids tonight and take half of your insulin tonight.  Return if not improving and follow-up with your doctor next week

## 2023-08-02 NOTE — Discharge Instructions (Signed)
 Go to the emergency department for further evaluation.

## 2023-08-02 NOTE — ED Notes (Signed)
Patient drinking water for PO challenge  

## 2023-08-16 ENCOUNTER — Ambulatory Visit: Payer: 59 | Admitting: "Endocrinology

## 2023-08-16 ENCOUNTER — Encounter: Payer: Self-pay | Admitting: "Endocrinology

## 2023-08-16 VITALS — BP 124/76 | HR 60 | Ht 61.0 in | Wt 221.4 lb

## 2023-08-16 DIAGNOSIS — E1165 Type 2 diabetes mellitus with hyperglycemia: Secondary | ICD-10-CM

## 2023-08-16 DIAGNOSIS — Z7985 Long-term (current) use of injectable non-insulin antidiabetic drugs: Secondary | ICD-10-CM

## 2023-08-16 DIAGNOSIS — I10 Essential (primary) hypertension: Secondary | ICD-10-CM | POA: Diagnosis not present

## 2023-08-16 DIAGNOSIS — E559 Vitamin D deficiency, unspecified: Secondary | ICD-10-CM

## 2023-08-16 DIAGNOSIS — E782 Mixed hyperlipidemia: Secondary | ICD-10-CM | POA: Diagnosis not present

## 2023-08-16 DIAGNOSIS — Z794 Long term (current) use of insulin: Secondary | ICD-10-CM

## 2023-08-16 DIAGNOSIS — Z91199 Patient's noncompliance with other medical treatment and regimen due to unspecified reason: Secondary | ICD-10-CM

## 2023-08-16 LAB — POCT GLYCOSYLATED HEMOGLOBIN (HGB A1C): HbA1c, POC (controlled diabetic range): 14.9 % — AB (ref 0.0–7.0)

## 2023-08-16 MED ORDER — INSULIN LISPRO PROT & LISPRO (75-25 MIX) 100 UNIT/ML KWIKPEN: 60.0000 [IU] | PEN_INJECTOR | Freq: Two times a day (BID) | SUBCUTANEOUS | 1 refills | Status: DC

## 2023-08-16 MED ORDER — FREESTYLE LIBRE 3 PLUS SENSOR MISC: 2 refills | Status: DC

## 2023-08-16 MED ORDER — FREESTYLE LIBRE 3 READER DEVI: 1.0000 | Freq: Once | 0 refills | Status: DC | PRN

## 2023-08-16 MED ORDER — SEMAGLUTIDE (2 MG/DOSE) 8 MG/3ML ~~LOC~~ SOPN: 2.0000 mg | PEN_INJECTOR | SUBCUTANEOUS | 2 refills | Status: DC

## 2023-08-16 NOTE — Progress Notes (Signed)
08/16/2023, 5:32 PM                                                     Endocrinology follow-up note   Subjective:    Patient ID: Lindsey Vazquez, female    DOB: 07/15/1971.  Lindsey Vazquez is being seen in follow-up for management of currently uncontrolled symptomatic type 2 diabetes, hyperlipidemia, vitamin D deficiency.  PCP:   Erasmo Downer, NP.   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/2021   Social History   Socioeconomic History   Marital status: Married    Spouse name: Not on file   Number of children: Not on file   Years of education: Not on file   Highest education level: Not on file  Occupational History   Not on file  Tobacco Use   Smoking status: Former    Current packs/day: 0.00    Types: Cigarettes    Quit date: 07/25/2015    Years since quitting: 8.0   Smokeless tobacco: Never  Vaping Use   Vaping status: Former  Substance and Sexual Activity   Alcohol use: No    Comment: socially   Drug use: No   Sexual activity: Yes  Other Topics Concern   Not on file  Social History Narrative   Not on file   Social Drivers of Health   Financial Resource Strain: Low Risk  (10/18/2021)   Received from [facility], [facility]   Overall Financial Resource Strain (CARDIA)    Difficulty of Paying Living Expenses: Not hard at all  Food Insecurity: No Food Insecurity (11/24/2022)   Hunger Vital Sign    Worried About Running Out of Food in the Last Year: Never true    Ran Out of Food in the Last Year: Never true  Transportation Needs: No Transportation Needs (11/24/2022)   PRAPARE - Administrator, Civil Service (Medical): No    Lack of  Transportation (Non-Medical): No  Physical Activity: Not on file  Stress: Not on file  Social Connections: Unknown (05/16/2022)   Received from [facility], [facility]   Social Network    Social Network: Not on file   Outpatient Encounter Medications as of 08/16/2023  Medication Sig   Continuous Glucose Receiver (FREESTYLE LIBRE 3 READER) DEVI 1 Piece by Does not apply route once as needed for up to 1 dose.   Continuous Glucose Sensor (FREESTYLE LIBRE 3 PLUS SENSOR) MISC Change sensor every 15 days.   Semaglutide, 2 MG/DOSE, 8 MG/3ML SOPN Inject 2 mg as directed once a week.   acetaminophen (TYLENOL) 500 MG tablet Take 1 tablet (500 mg total) by mouth every 6 (six) hours as needed for mild pain, fever or headache.   apixaban (ELIQUIS) 5 MG TABS tablet Take 1 tablet (5 mg total) by mouth 2 (two) times daily. To start after you finish the starter pack   atorvastatin (LIPITOR) 80 MG tablet Take 80 mg by mouth daily.  Cholecalciferol 50 MCG (2000 UT) TABS Take 1 tablet by mouth daily.   CLINDESSE vaginal cream once. Apply 1 application vaginally every night   Insulin Lispro Prot & Lispro (HUMALOG MIX 75/25 KWIKPEN) (75-25) 100 UNIT/ML Kwikpen Inject 60 Units into the skin 2 (two) times daily before a meal.   magnesium oxide (MAG-OX) 400 (240 Mg) MG tablet Take 1 tablet by mouth daily.   metoprolol succinate (TOPROL-XL) 50 MG 24 hr tablet Take 50 mg by mouth daily.   Multiple Vitamin (MULTIVITAMIN) tablet Take 1 tablet by mouth daily.   ondansetron (ZOFRAN-ODT) 4 MG disintegrating tablet 4mg  ODT q4 hours prn nausea/vomit   potassium chloride SA (KLOR-CON M) 20 MEQ tablet Take 10 mEq by mouth every morning.   telmisartan (MICARDIS) 20 MG tablet Take 20 mg by mouth every other day.   torsemide (DEMADEX) 20 MG tablet Take 20 mg by mouth daily.   [DISCONTINUED] Continuous Blood Gluc Receiver (FREESTYLE LIBRE 2 READER) DEVI As directed   [DISCONTINUED] Continuous Glucose Sensor (FREESTYLE LIBRE  2 SENSOR) MISC 6 each by Other route every 14 (fourteen) days.   [DISCONTINUED] Insulin Lispro Prot & Lispro (HUMALOG MIX 75/25 KWIKPEN) (75-25) 100 UNIT/ML Kwikpen Inject 60 Units into the skin 2 (two) times daily before a meal. (Patient taking differently: Inject 30-60 Units into the skin 2 (two) times daily before a meal.)   [DISCONTINUED] Semaglutide, 1 MG/DOSE, 4 MG/3ML SOPN Inject 1 mg as directed once a week.   No facility-administered encounter medications on file as of 08/16/2023.    ALLERGIES: Allergies  Allergen Reactions   Jardiance [Empagliflozin] Other (See Comments)    Put her in DKA per pt   Metformin And Related Nausea And Vomiting    VACCINATION STATUS:  There is no immunization history on file for this patient.  Diabetes She presents for her follow-up diabetic visit. She has type 2 diabetes mellitus. Onset time: Diagnosed at approximate age of 42 years, after episode of gestational diabetes. Her disease course has been worsening. There are no hypoglycemic associated symptoms. Pertinent negatives for hypoglycemia include no confusion, headaches, pallor or seizures. Associated symptoms include foot paresthesias, polydipsia and polyuria. Pertinent negatives for diabetes include no chest pain and no fatigue. There are no hypoglycemic complications. Symptoms are worsening (Since her last visit, she was hospitalized for nausea/vomiting and was diagnosed with diabetes ketoacidosis.). Diabetic complications include nephropathy. Risk factors for coronary artery disease include diabetes mellitus, dyslipidemia, hypertension, family history, obesity, sedentary lifestyle and tobacco exposure. Current diabetic treatment includes insulin injections. Her weight is fluctuating minimally. She is following a generally unhealthy diet. When asked about meal planning, she reported none. She rarely participates in exercise. Her home blood glucose trend is increasing steadily. Her breakfast blood  glucose range is generally >200 mg/dl. Her lunch blood glucose range is generally >200 mg/dl. Her dinner blood glucose range is generally >200 mg/dl. Her bedtime blood glucose range is generally >200 mg/dl. Her overall blood glucose range is >200 mg/dl. (She presents with continued severe hyperglycemic burden averaging 390 mg per DL over the last 14 days.  Tragically, this patient does not track/review her glycemic profile even when she has her sensor on.  Her AGP report shows 100% level 2 hyperglycemia.  Her point-of-care A1c is 14.9% increasing from 11.6%.    She has no hypoglycemia.    ) An ACE inhibitor/angiotensin II receptor blocker is not being taken.  Hyperlipidemia This is a chronic problem. The current episode started more than 1 year  ago. The problem is uncontrolled. Exacerbating diseases include diabetes and obesity. Pertinent negatives include no chest pain, myalgias or shortness of breath. Current antihyperlipidemic treatment includes statins. Risk factors for coronary artery disease include dyslipidemia, diabetes mellitus, hypertension, family history, obesity and a sedentary lifestyle.  Hypertension This is a chronic problem. The current episode started more than 1 year ago. Pertinent negatives include no chest pain, headaches, palpitations or shortness of breath. Risk factors for coronary artery disease include diabetes mellitus, dyslipidemia, sedentary lifestyle, smoking/tobacco exposure and obesity.    Review of systems  Constitutional: + Minimally fluctuating body weight,  current  Body mass index is 41.83 kg/m. , no fatigue, no subjective hyperthermia, no subjective hypothermia   Objective:    BP 124/76   Pulse 60   Ht 5\' 1"  (1.549 m)   Wt 221 lb 6.4 oz (100.4 kg)   BMI 41.83 kg/m   Wt Readings from Last 3 Encounters:  08/16/23 221 lb 6.4 oz (100.4 kg)  08/02/23 223 lb (101.2 kg)  12/14/22 230 lb 9.6 oz (104.6 kg)      Physical Exam- Limited  Constitutional:   Body mass index is 41.83 kg/m. , not in acute distress, normal state of mind Abdomen: Skin exam is not convincing for the amount of insulin she is given to inject.   CMP     Component Value Date/Time   NA 130 (L) 08/02/2023 1117   NA 140 11/28/2022 0824   K 4.0 08/02/2023 1117   CL 92 (L) 08/02/2023 1117   CO2 24 08/02/2023 1117   GLUCOSE 394 (H) 08/02/2023 1117   BUN 26 (H) 08/02/2023 1117   BUN 13 11/28/2022 0824   CREATININE 0.97 08/02/2023 1117   CREATININE 0.55 12/29/2019 1004   CALCIUM 8.7 (L) 08/02/2023 1117   PROT 7.3 08/02/2023 1117   PROT 6.9 11/28/2022 0824   ALBUMIN 3.2 (L) 08/02/2023 1117   ALBUMIN 3.8 11/28/2022 0824   AST 27 08/02/2023 1117   ALT 21 08/02/2023 1117   ALKPHOS 139 (H) 08/02/2023 1117   BILITOT 1.1 08/02/2023 1117   BILITOT <0.2 11/28/2022 0824   GFRNONAA >60 08/02/2023 1117   GFRNONAA 109 12/29/2019 1004   GFRAA 127 12/29/2019 1004   Lipid Panel     Component Value Date/Time   CHOL 278 (H) 11/28/2022 0824   TRIG 123 11/28/2022 0824   HDL 84 11/28/2022 0824   CHOLHDL 3.3 11/28/2022 0824   CHOLHDL 3.3 12/29/2019 1004   LDLCALC 173 (H) 11/28/2022 0824   LDLCALC 99 12/29/2019 1004   LABVLDL 21 11/28/2022 0824     Assessment & Plan:   1. Uncontrolled type 2 diabetes mellitus with hyperglycemia, CKD   - Lindsey Vazquez has currently uncontrolled symptomatic type 2 DM since 53 years of age.  She presents with continued severe hyperglycemic burden averaging 390 mg per DL over the last 14 days.  Tragically, this patient does not track/review her glycemic profile even when she has her sensor on.  Her AGP report shows 100% level 2 hyperglycemia.  Her point-of-care A1c is 14.9% increasing from 11.6%.    She has no hypoglycemia.    -She did not have  hypoglycemic episodes since last visit.   Recent labs reviewed, which also shows significant vitamin D deficiency.  -her diabetes is complicated by DKA, obesity, and sedentary life and she  remains at a high risk for more acute and chronic complications which include CAD, CVA, CKD, retinopathy, and neuropathy. These are all discussed  in detail with her.  - I have counseled her on diet management and weight loss, by adopting a carbohydrate restricted/protein rich diet. This patient has not engaged in lifestyle medicine.  She is in fact the best candidate for lifestyle medicine in light of the fact that she is losing control of diabetes despite recent bariatric surgery.  - she acknowledges that there is a room for improvement in her food and drink choices. - Suggestion is made for her to avoid simple carbohydrates  from her diet including Cakes, Sweet Desserts, Ice Cream, Soda (diet and regular), Sweet Tea, Candies, Chips, Cookies, Store Bought Juices, Alcohol , Artificial Sweeteners,  Coffee Creamer, and "Sugar-free" Products, Lemonade. This will help patient to have more stable blood glucose profile and potentially avoid unintended weight gain.  The following Lifestyle Medicine recommendations according to American College of Lifestyle Medicine  [facility]) were discussed and and offered to patient and she  agrees to start the journey:  A. Whole Foods, Plant-Based Nutrition comprising of fruits and vegetables, plant-based proteins, whole-grain carbohydrates was discussed in detail with the patient.   A list for source of those nutrients were also provided to the patient.  Patient will use only water or unsweetened tea for hydration. B.  The need to stay away from risky substances including alcohol, smoking; obtaining 7 to 9 hours of restorative sleep, at least 150 minutes of moderate intensity exercise weekly, the importance of healthy social connections,  and stress management techniques were discussed. C.  A full color page of  Calorie density of various food groups per pound showing examples of each food groups was provided to the patient.   - she is advised to stick to a routine mealtimes  to eat 3 meals  a day and avoid unnecessary snacks ( to snack only to correct hypoglycemia).   - I have approached her with the following individualized plan to manage diabetes and patient agrees:    -It is doubtful that this patient follows and contacts the recommended treatment.  This is evident from the fact that she is scanning/reviewing her blood glucose 0-1 time per day on average.   Even while her AGP shows 100% level 2 hyperglycemia, she verbally reports that she is getting hypoglycemic episodes which are not documented or captured by the device. She will continue to need multiple daily injections of insulin in order for her to achieve control of diabetes to target.  However this patient did not execute basal/bolus insulin option on prior attempt.    -For simplicity reasons, she  was switched to premixed insulin Humalog 75/25 during her last visit.   She is hesitant to lower her blood glucose, insulin dose adjustment will be made slowly.  She is advised to resume and continue Humalog 75/25 60 units daily at breakfast and 60 units daily at supper  for Premeal blood glucose readings above 90 mg per DL.   She is advised to stay off of metformin for now.  She is encouraged to continue to use her CGM continuously.  She took herself off of GLP-1 receptor agonist after her recent hospitalization for pneumonia.  She would benefit from resumption of this regimen.  I discussed and reinitiated Ozempic at 1 mg subcutaneously weekly.  - she is encouraged to call clinic for blood glucose levels less than 70 or above 200 mg /dl weekly average.   -Advised to stay away from SGLT2 inhibitors given her recent history of euglycemic DKA while taking Jardiance.  -She  continues to tolerate Ozempic.  I discussed and increased her Ozempic to 2 mg subcutaneously weekly.  - Patient specific target  A1c;  LDL, HDL, Triglycerides were discussed in detail.  2) BP/HTN:  -Her blood pressure is controlled to target.     she is currently not taking any blood pressure medications.    3) Lipids/HPL: She has not engaged with lifestyle medicine nutrition.  Her LDL is increasing to 173 from 99.  She is advised to continue atorvastatin 20 mg p.o. nightly.    She may need intervention with Repatha on subsequent visits.  Patient has questionable compliance with all of her medications.  Side effects and precautions discussed with her.  4) vitamin  D deficiency- 25 hydroxy vitamin D remains low despite a course of treatment with vitamin D3 5000 units for 90 days .she is approached for retreatment with vitamin D3 5000 units daily for the next 90 days.    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   6) Chronic Care/Health Maintenance:  -she  is on Statin medications and  is encouraged to initiate and continue to follow up with Ophthalmology, Dentist, nephrology, podiatrist at least yearly or according to recommendations, and advised to   stay away from smoking. I have recommended yearly flu vaccine and pneumonia vaccine at least every 5 years; moderate intensity exercise for up to 150 minutes weekly; and  sleep for at least 7 hours a day.  - I advised patient to maintain close follow up with Erasmo Downer, NP for primary care needs.    I spent  42  minutes in the care of the patient today including review of labs from CMP, Lipids, Thyroid Function, Hematology (current and previous including abstractions from other facilities); face-to-face time discussing  her blood glucose readings/logs, discussing hypoglycemia and hyperglycemia episodes and symptoms, medications doses, her options of short and long term treatment based on the latest standards of care / guidelines;  discussion about incorporating lifestyle medicine;  and documenting the encounter. Risk reduction counseling performed per USPSTF guidelines to reduce  obesity and cardiovascular risk factors.     Please  refer to Patient Instructions for Blood Glucose Monitoring and Insulin/Medications Dosing Guide"  in media tab for additional information. Please  also refer to " Patient Self Inventory" in the Media  tab for reviewed elements of pertinent patient history.  Lindsey Vazquez participated in the discussions, expressed understanding, and voiced agreement with the above plans.  All questions were answered to her satisfaction. she is encouraged to contact clinic should she have any questions or concerns prior to her return visit.   Follow up plan: - Return in about 4 weeks (around 09/13/2023) for F/U with Meter/CGM /Logs Only - no Labs.  Marquis Lunch, MD [facility] [address] [place], [place] [address] Phone: [phone]  Fax: [phone]    08/16/2023, 5:32 PM  This note was partially dictated with voice recognition software. Similar sounding words can be transcribed inadequately or may not  be corrected upon review.

## 2023-08-16 NOTE — Patient Instructions (Signed)

## 2023-08-21 ENCOUNTER — Other Ambulatory Visit: Payer: Self-pay

## 2023-08-21 DIAGNOSIS — E1165 Type 2 diabetes mellitus with hyperglycemia: Secondary | ICD-10-CM

## 2023-08-21 MED ORDER — FREESTYLE LIBRE 2 READER DEVI: 0 refills | Status: DC

## 2023-08-21 MED ORDER — FREESTYLE LIBRE 2 SENSOR MISC: 2 refills | Status: DC

## 2023-09-17 ENCOUNTER — Encounter: Payer: Self-pay | Admitting: "Endocrinology

## 2023-09-17 ENCOUNTER — Ambulatory Visit: Payer: 59 | Admitting: "Endocrinology

## 2023-09-17 VITALS — BP 112/66 | HR 64 | Ht 61.0 in | Wt 216.0 lb

## 2023-09-17 DIAGNOSIS — I10 Essential (primary) hypertension: Secondary | ICD-10-CM | POA: Diagnosis not present

## 2023-09-17 DIAGNOSIS — Z794 Long term (current) use of insulin: Secondary | ICD-10-CM | POA: Insufficient documentation

## 2023-09-17 DIAGNOSIS — E782 Mixed hyperlipidemia: Secondary | ICD-10-CM

## 2023-09-17 DIAGNOSIS — E1165 Type 2 diabetes mellitus with hyperglycemia: Secondary | ICD-10-CM

## 2023-09-17 MED ORDER — INSULIN LISPRO PROT & LISPRO (75-25 MIX) 100 UNIT/ML KWIKPEN
60.0000 [IU] | PEN_INJECTOR | Freq: Two times a day (BID) | SUBCUTANEOUS | 2 refills | Status: DC
Start: 2023-09-17 — End: 2023-10-22

## 2023-09-17 NOTE — Patient Instructions (Signed)

## 2023-09-17 NOTE — Progress Notes (Signed)
 09/17/2023, 3:44 PM                                                     Endocrinology follow-up note   Subjective:    Patient ID: Lindsey Vazquez, female    DOB: 01-26-1971.  Lindsey Vazquez is being seen in follow-up for management of currently uncontrolled symptomatic type 2 diabetes, hyperlipidemia, vitamin D deficiency.  PCP:   Erasmo Downer, NP.   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/2021   Social History   Socioeconomic History   Marital status: Married    Spouse name: Not on file   Number of children: Not on file   Years of education: Not on file   Highest education level: Not on file  Occupational History   Not on file  Tobacco Use   Smoking status: Former    Current packs/day: 0.00    Types: Cigarettes    Quit date: 07/25/2015    Years since quitting: 8.1   Smokeless tobacco: Never  Vaping Use   Vaping status: Former  Substance and Sexual Activity   Alcohol use: No    Comment: socially   Drug use: No   Sexual activity: Yes  Other Topics Concern   Not on file  Social History Narrative   Not on file   Social Drivers of Health   Financial Resource Strain: Low Risk  (10/18/2021)   Received from [facility], [facility]   Overall Financial Resource Strain (CARDIA)    Difficulty of Paying Living Expenses: Not hard at all  Food Insecurity: No Food Insecurity (11/24/2022)   Hunger Vital Sign    Worried About Running Out of Food in the Last Year: Never true    Ran Out of Food in the Last Year: Never true  Transportation Needs: No Transportation Needs (11/24/2022)   PRAPARE - Administrator, Civil Service (Medical): No    Lack of  Transportation (Non-Medical): No  Physical Activity: Not on file  Stress: Not on file  Social Connections: Unknown (05/16/2022)   Received from [facility]   Social Network    Social Network: Not on file   Outpatient Encounter Medications as of 09/17/2023  Medication Sig   acetaminophen (TYLENOL) 500 MG tablet Take 1 tablet (500 mg total) by mouth every 6 (six) hours as needed for mild pain, fever or headache.   apixaban (ELIQUIS) 5 MG TABS tablet Take 1 tablet (5 mg total) by mouth 2 (two) times daily. To start after you finish the starter pack   atorvastatin (LIPITOR) 80 MG tablet Take 80 mg by mouth daily.   Cholecalciferol 50 MCG (2000 UT) TABS Take 1 tablet by mouth daily.   CLINDESSE vaginal cream once. Apply 1 application vaginally every night   Continuous Glucose Receiver (FREESTYLE LIBRE 2 READER) DEVI Use to monitor glucose continuously as directed.   Continuous Glucose Sensor (FREESTYLE LIBRE 2 SENSOR) MISC Use to monitor glucose continuously as  directed.   Insulin Lispro Prot & Lispro (HUMALOG MIX 75/25 KWIKPEN) (75-25) 100 UNIT/ML Kwikpen Inject 60-80 Units into the skin 2 (two) times daily before a meal.   magnesium oxide (MAG-OX) 400 (240 Mg) MG tablet Take 1 tablet by mouth daily.   metoprolol succinate (TOPROL-XL) 50 MG 24 hr tablet Take 50 mg by mouth daily.   Multiple Vitamin (MULTIVITAMIN) tablet Take 1 tablet by mouth daily.   ondansetron (ZOFRAN-ODT) 4 MG disintegrating tablet 4mg  ODT q4 hours prn nausea/vomit   potassium chloride SA (KLOR-CON M) 20 MEQ tablet Take 10 mEq by mouth every morning.   Semaglutide, 2 MG/DOSE, 8 MG/3ML SOPN Inject 2 mg as directed once a week.   telmisartan (MICARDIS) 20 MG tablet Take 20 mg by mouth every other day.   torsemide (DEMADEX) 20 MG tablet Take 20 mg by mouth daily.   [DISCONTINUED] Insulin Lispro Prot & Lispro (HUMALOG MIX 75/25 KWIKPEN) (75-25) 100 UNIT/ML Kwikpen Inject 60 Units into the skin 2 (two) times  daily before a meal.   No facility-administered encounter medications on file as of 09/17/2023.    ALLERGIES: Allergies  Allergen Reactions   Jardiance [Empagliflozin] Other (See Comments)    Put her in DKA per pt   Metformin And Related Nausea And Vomiting    VACCINATION STATUS:  There is no immunization history on file for this patient.  Diabetes She presents for her follow-up diabetic visit. She has type 2 diabetes mellitus. Onset time: Diagnosed at approximate age of 34 years, after episode of gestational diabetes. Her disease course has been improving. There are no hypoglycemic associated symptoms. Pertinent negatives for hypoglycemia include no confusion, headaches, pallor or seizures. Associated symptoms include foot paresthesias, polydipsia and polyuria. Pertinent negatives for diabetes include no chest pain and no fatigue. There are no hypoglycemic complications. Symptoms are improving (Since her last visit, she was hospitalized for nausea/vomiting and was diagnosed with diabetes ketoacidosis.). Diabetic complications include nephropathy. Risk factors for coronary artery disease include diabetes mellitus, dyslipidemia, hypertension, family history, obesity, sedentary lifestyle and tobacco exposure. Current diabetic treatment includes insulin injections. Her weight is decreasing steadily. She is following a generally unhealthy diet. When asked about meal planning, she reported none. She rarely participates in exercise. Her home blood glucose trend is decreasing steadily. Her breakfast blood glucose range is generally >200 mg/dl. Her lunch blood glucose range is generally >200 mg/dl. Her dinner blood glucose range is generally >200 mg/dl. Her bedtime blood glucose range is generally >200 mg/dl. Her overall blood glucose range is >200 mg/dl. (She presents with improved, however, still significantly above target glycemic profile.  Her AGP report shows 32% time in range, 13% level 1  hyperglycemia, 55% level 2 hyperglycemia.  During her last visit, she was 100% in level 2 hyperglycemia.  Her average blood glucose is 267 mg per DL.  She did not document any hypoglycemia.   ) An ACE inhibitor/angiotensin II receptor blocker is not being taken.  Hyperlipidemia This is a chronic problem. The current episode started more than 1 year ago. The problem is uncontrolled. Exacerbating diseases include diabetes and obesity. Pertinent negatives include no chest pain, myalgias or shortness of breath. Current antihyperlipidemic treatment includes statins. Risk factors for coronary artery disease include dyslipidemia, diabetes mellitus, hypertension, family history, obesity and a sedentary lifestyle.  Hypertension This is a chronic problem. The current episode started more than 1 year ago. Pertinent negatives include no chest pain, headaches, palpitations or shortness of breath. Risk factors for coronary  artery disease include diabetes mellitus, dyslipidemia, sedentary lifestyle, smoking/tobacco exposure and obesity.    Review of systems  Constitutional: + Minimally fluctuating body weight,  current  Body mass index is 40.81 kg/m. , no fatigue, no subjective hyperthermia, no subjective hypothermia   Objective:    BP 112/66   Pulse 64   Ht 5\' 1"  (1.549 m)   Wt 216 lb (98 kg)   BMI 40.81 kg/m   Wt Readings from Last 3 Encounters:  09/17/23 216 lb (98 kg)  08/16/23 221 lb 6.4 oz (100.4 kg)  08/02/23 223 lb (101.2 kg)      Physical Exam- Limited  Constitutional:  Body mass index is 40.81 kg/m. , not in acute distress, normal state of mind Abdomen: Skin exam is not convincing for the amount of insulin she is given to inject.   CMP     Component Value Date/Time   NA 130 (L) 08/02/2023 1117   NA 140 11/28/2022 0824   K 4.0 08/02/2023 1117   CL 92 (L) 08/02/2023 1117   CO2 24 08/02/2023 1117   GLUCOSE 394 (H) 08/02/2023 1117   BUN 26 (H) 08/02/2023 1117   BUN 13 11/28/2022  0824   CREATININE 0.97 08/02/2023 1117   CREATININE 0.55 12/29/2019 1004   CALCIUM 8.7 (L) 08/02/2023 1117   PROT 7.3 08/02/2023 1117   PROT 6.9 11/28/2022 0824   ALBUMIN 3.2 (L) 08/02/2023 1117   ALBUMIN 3.8 11/28/2022 0824   AST 27 08/02/2023 1117   ALT 21 08/02/2023 1117   ALKPHOS 139 (H) 08/02/2023 1117   BILITOT 1.1 08/02/2023 1117   BILITOT <0.2 11/28/2022 0824   GFRNONAA >60 08/02/2023 1117   GFRNONAA 109 12/29/2019 1004   GFRAA 127 12/29/2019 1004   Lipid Panel     Component Value Date/Time   CHOL 278 (H) 11/28/2022 0824   TRIG 123 11/28/2022 0824   HDL 84 11/28/2022 0824   CHOLHDL 3.3 11/28/2022 0824   CHOLHDL 3.3 12/29/2019 1004   LDLCALC 173 (H) 11/28/2022 0824   LDLCALC 99 12/29/2019 1004   LABVLDL 21 11/28/2022 0824     Assessment & Plan:   1. Uncontrolled type 2 diabetes mellitus with hyperglycemia, CKD   - Marthe G Penaloza has currently uncontrolled symptomatic type 2 DM since 53 years of age.  She presents with improved, however, still significantly above target glycemic profile.  Her AGP report shows 32% time in range, 13% level 1 hyperglycemia, 55% level 2 hyperglycemia.  During her last visit, she was 100% in level 2 hyperglycemia.  Her average blood glucose is 267 mg per DL.  She did not document any hypoglycemia.  She is benefiting from [employer].  -She did not have  hypoglycemic episodes since last visit.   Recent labs reviewed, which also shows significant vitamin D deficiency.  -her diabetes is complicated by DKA, obesity, and sedentary life and she remains at a high risk for more acute and chronic complications which include CAD, CVA, CKD, retinopathy, and neuropathy. These are all discussed in detail with her.  - I have counseled her on diet management and weight loss, by adopting a carbohydrate restricted/protein rich diet. This patient has not engaged in lifestyle medicine.  She is in fact the best candidate for lifestyle medicine in light of  the fact that she is losing control of diabetes despite recent bariatric surgery.  - she acknowledges that there is a room for improvement in her food and drink choices. - Suggestion is made  for her to avoid simple carbohydrates  from her diet including Cakes, Sweet Desserts, Ice Cream, Soda (diet and regular), Sweet Tea, Candies, Chips, Cookies, Store Bought Juices, Alcohol , Artificial Sweeteners,  Coffee Creamer, and "Sugar-free" Products, Lemonade. This will help patient to have more stable blood glucose profile and potentially avoid unintended weight gain.  The following Lifestyle Medicine recommendations according to American College of Lifestyle Medicine  [facility]) were discussed and and offered to patient and she  agrees to start the journey:  A. Whole Foods, Plant-Based Nutrition comprising of fruits and vegetables, plant-based proteins, whole-grain carbohydrates was discussed in detail with the patient.   A list for source of those nutrients were also provided to the patient.  Patient will use only water or unsweetened tea for hydration. B.  The need to stay away from risky substances including alcohol, smoking; obtaining 7 to 9 hours of restorative sleep, at least 150 minutes of moderate intensity exercise weekly, the importance of healthy social connections,  and stress management techniques were discussed. C.  A full color page of  Calorie density of various food groups per pound showing examples of each food groups was provided to the patient.   - she is advised to stick to a routine mealtimes to eat 3 meals  a day and avoid unnecessary snacks ( to snack only to correct hypoglycemia).   - I have approached her with the following individualized plan to manage diabetes and patient agrees:    -It is doubtful that this patient follows and contacts the recommended treatment most of the time.   She will continue to need multiple daily injections of insulin in order for her to achieve control  of diabetes to target.  Since she is consuming 2 meals a day, premixed insulin twice a day still preferred over MDI or U500.  -I discussed and increased her Humalog 75/25 to 80 units with breakfast and 60 units with supper for Premeal blood glucose readings above 90 mg per DL.   She is advised to stay off of metformin for now.  She is encouraged to continue to use her CGM continuously.  -She is advised to continue Ozempic 2 mg subcutaneously weekly. - she is encouraged to call clinic for blood glucose levels less than 70 or above 200 mg /dl weekly average.   -Advised to stay away from SGLT2 inhibitors given her recent history of euglycemic DKA while taking Jardiance.   - Patient specific target  A1c;  LDL, HDL, Triglycerides were discussed in detail.  2) BP/HTN:  Her blood pressure is controlled to target.    she is currently not taking any blood pressure medications.    3) Lipids/HPL: She has not engaged with lifestyle medicine nutrition.  Her LDL is increasing to 173 from 99.  She is advised to continue atorvastatin 20 mg p.o. nightly.    She may need intervention with Repatha on subsequent visits.  Patient has questionable compliance with all of her medications.  Side effects and precautions discussed with her.  4) vitamin  D deficiency- 25 hydroxy vitamin D remains low despite a course of treatment with vitamin D3 5000 units for 90 days .she is approached for retreatment with vitamin D3 5000 units daily for the next 90 days.    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   6) Chronic Care/Health Maintenance:  -she  is on Statin medications and  is encouraged to  initiate and continue to follow up with Ophthalmology, Dentist, nephrology, podiatrist at least yearly or according to recommendations, and advised to   stay away from smoking. I have recommended yearly flu vaccine and pneumonia vaccine at least every 5 years; moderate intensity  exercise for up to 150 minutes weekly; and  sleep for at least 7 hours a day.  - I advised patient to maintain close follow up with Erasmo Downer, NP for primary care needs.   I spent  26  minutes in the care of the patient today including review of labs from CMP, Lipids, Thyroid Function, Hematology (current and previous including abstractions from other facilities); face-to-face time discussing  her blood glucose readings/logs, discussing hypoglycemia and hyperglycemia episodes and symptoms, medications doses, her options of short and long term treatment based on the latest standards of care / guidelines;  discussion about incorporating lifestyle medicine;  and documenting the encounter. Risk reduction counseling performed per USPSTF guidelines to reduce  obesity and cardiovascular risk factors.     Please refer to Patient Instructions for Blood Glucose Monitoring and Insulin/Medications Dosing Guide"  in media tab for additional information. Please  also refer to " Patient Self Inventory" in the Media  tab for reviewed elements of pertinent patient history.  Lindsey Vazquez participated in the discussions, expressed understanding, and voiced agreement with the above plans.  All questions were answered to her satisfaction. she is encouraged to contact clinic should she have any questions or concerns prior to her return visit.    Follow up plan: - Return in about 3 months (around 12/15/2023) for F/U with Pre-visit Labs, Meter/CGM/Logs, A1c here.  Marquis Lunch, MD [facility] [address] [place], [place] [address] Phone: [phone]  Fax: [phone]    09/17/2023, 3:44 PM  This note was partially dictated with voice recognition software. Similar sounding words can be transcribed inadequately or may not  be corrected upon review.

## 2023-09-20 ENCOUNTER — Telehealth: Payer: Self-pay

## 2023-09-20 NOTE — Telephone Encounter (Signed)
Tried to return a call to pt but did not receive an answer and was unable to leave a message.

## 2023-09-21 ENCOUNTER — Other Ambulatory Visit: Payer: Self-pay

## 2023-09-21 DIAGNOSIS — E1165 Type 2 diabetes mellitus with hyperglycemia: Secondary | ICD-10-CM

## 2023-09-21 MED ORDER — FREESTYLE LIBRE 2 SENSOR MISC: 0 refills | Status: DC

## 2023-10-11 ENCOUNTER — Ambulatory Visit: Payer: 59 | Admitting: Obstetrics & Gynecology

## 2023-10-11 ENCOUNTER — Other Ambulatory Visit (HOSPITAL_COMMUNITY)
Admission: RE | Admit: 2023-10-11 | Discharge: 2023-10-11 | Disposition: A | Discharge status: Home / Self Care | Source: Ambulatory Visit | Attending: Obstetrics & Gynecology | Admitting: Obstetrics & Gynecology

## 2023-10-11 ENCOUNTER — Encounter: Payer: Self-pay | Admitting: Obstetrics and Gynecology

## 2023-10-11 ENCOUNTER — Ambulatory Visit: Payer: 59 | Admitting: Obstetrics and Gynecology

## 2023-10-11 VITALS — BP 91/53 | HR 75 | Ht 61.0 in | Wt 216.0 lb

## 2023-10-11 DIAGNOSIS — B3731 Acute candidiasis of vulva and vagina: Secondary | ICD-10-CM

## 2023-10-11 DIAGNOSIS — Z01419 Encounter for gynecological examination (general) (routine) without abnormal findings: Secondary | ICD-10-CM | POA: Insufficient documentation

## 2023-10-11 MED ORDER — FLUCONAZOLE 100 MG PO TABS: 100.0000 mg | ORAL_TABLET | Freq: Every day | ORAL | 0 refills | Status: DC

## 2023-10-11 NOTE — Progress Notes (Signed)
 Subjective:     Lindsey Vazquez is a 53 y.o. female here for a routine exam.  No LMP recorded. Patient is postmenopausal. G1P1 Birth Control Method:  menopausal Menstrual Calendar(currently): amenorrhea  Current complaints: recurrent yeast over the past year, 5-6.   Current acute medical issues:  diabetic, hx of PE   Recent Gynecologic History No LMP recorded. Patient is postmenopausal. Last Pap: 2024,  normal Last mammogram: 2023?,  [place]  Past Medical History:  Diagnosis Date   Diabetes mellitus without complication (HCC)    Hyperlipemia    Hypertension     Past Surgical History:  Procedure Laterality Date   c-section     CARDIAC CATHETERIZATION  07/2021   LAPAROSCOPIC GASTRIC SLEEVE RESECTION  08/11/2021    OB History     Gravida  1   Para  1   Term      Preterm      AB      Living  1      SAB      IAB      Ectopic      Multiple      Live Births              Social History   Socioeconomic History   Marital status: Married    Spouse name: Not on file   Number of children: Not on file   Years of education: Not on file   Highest education level: Not on file  Occupational History   Not on file  Tobacco Use   Smoking status: Former    Current packs/day: 0.00    Types: Cigarettes    Quit date: 07/25/2015    Years since quitting: 8.2   Smokeless tobacco: Never  Vaping Use   Vaping status: Former  Substance and Sexual Activity   Alcohol use: No    Comment: socially   Drug use: No   Sexual activity: Yes  Other Topics Concern   Not on file  Social History Narrative   Not on file   Social Drivers of Health   Financial Resource Strain: Low Risk  (10/11/2023)   Overall Financial Resource Strain (CARDIA)    Difficulty of Paying Living Expenses: Not very hard  Food Insecurity: No Food Insecurity (10/11/2023)   Hunger Vital Sign    Worried About Running Out of Food in the Last Year: Never true    Ran Out of Food in the Last Year:  Never true  Transportation Needs: No Transportation Needs (10/11/2023)   PRAPARE - Administrator, Civil Service (Medical): No    Lack of Transportation (Non-Medical): No  Physical Activity: Sufficiently Active (10/11/2023)   Exercise Vital Sign    Days of Exercise per Week: 5 days    Minutes of Exercise per Session: 30 min  Stress: No Stress Concern Present (10/11/2023)   [employer] of Occupational Health - Occupational Stress Questionnaire    Feeling of Stress : Not at all  Social Connections: Socially Integrated (10/11/2023)   Social Connection and Isolation Panel [NHANES]    Frequency of Communication with Friends and Family: Three times a week    Frequency of Social Gatherings with Friends and Family: Once a week    Attends Religious Services: More than 4 times per year    Active Member of [employer] or Organizations: Yes    Attends Engineer, structural: More than 4 times per year    Marital Status: Married    History  reviewed. No pertinent family history.   Current Outpatient Medications:    acetaminophen (TYLENOL) 500 MG tablet, Take 1 tablet (500 mg total) by mouth every 6 (six) hours as needed for mild pain, fever or headache., Disp: , Rfl:    apixaban (ELIQUIS) 5 MG TABS tablet, Take 1 tablet (5 mg total) by mouth 2 (two) times daily. To start after you finish the starter pack, Disp: 60 tablet, Rfl: 1   atorvastatin (LIPITOR) 80 MG tablet, Take 80 mg by mouth daily., Disp: , Rfl:    Cholecalciferol 50 MCG (2000 UT) TABS, Take 1 tablet by mouth daily., Disp: , Rfl:    Continuous Glucose Receiver (FREESTYLE LIBRE 2 READER) DEVI, Use to monitor glucose continuously as directed., Disp: 1 each, Rfl: 0   Continuous Glucose Sensor (FREESTYLE LIBRE 2 SENSOR) MISC, Use to monitor glucose continuously as directed., Disp: 6 each, Rfl: 0   fluconazole (DIFLUCAN) 100 MG tablet, Take 1 tablet (100 mg total) by mouth daily., Disp: 7 tablet, Rfl: 0   Insulin Lispro Prot &  Lispro (HUMALOG MIX 75/25 KWIKPEN) (75-25) 100 UNIT/ML Kwikpen, Inject 60-80 Units into the skin 2 (two) times daily before a meal., Disp: 60 mL, Rfl: 2   magnesium oxide (MAG-OX) 400 (240 Mg) MG tablet, Take 1 tablet by mouth daily., Disp: , Rfl:    metoprolol succinate (TOPROL-XL) 50 MG 24 hr tablet, Take 50 mg by mouth daily., Disp: , Rfl:    Multiple Vitamin (MULTIVITAMIN) tablet, Take 1 tablet by mouth daily., Disp: , Rfl:    ondansetron (ZOFRAN-ODT) 4 MG disintegrating tablet, 4mg  ODT q4 hours prn nausea/vomit, Disp: 10 tablet, Rfl: 0   potassium chloride SA (KLOR-CON M) 20 MEQ tablet, Take 10 mEq by mouth every morning., Disp: , Rfl:    Semaglutide, 2 MG/DOSE, 8 MG/3ML SOPN, Inject 2 mg as directed once a week., Disp: 6 mL, Rfl: 2   telmisartan (MICARDIS) 20 MG tablet, Take 20 mg by mouth every other day., Disp: , Rfl:    torsemide (DEMADEX) 20 MG tablet, Take 20 mg by mouth daily., Disp: , Rfl:    CLINDESSE vaginal cream, once. Apply 1 application vaginally every night (Patient not taking: Reported on 10/11/2023), Disp: , Rfl:   Review of Systems  Review of Systems  Constitutional: Negative for fever, chills, weight loss, malaise/fatigue and diaphoresis.  HENT: Negative for hearing loss, ear pain, nosebleeds, congestion, sore throat, neck pain, tinnitus and ear discharge.   Eyes: Negative for blurred vision, double vision, photophobia, pain, discharge and redness.  Respiratory: Negative for cough, hemoptysis, sputum production, shortness of breath, wheezing and stridor.   Cardiovascular: Negative for chest pain, palpitations, orthopnea, claudication, leg swelling and PND.  Gastrointestinal: negative for abdominal pain. Negative for heartburn, nausea, vomiting, diarrhea, constipation, blood in stool and melena.  Genitourinary: Negative for dysuria, urgency, frequency, hematuria and flank pain.  Musculoskeletal: Negative for myalgias, back pain, joint pain and falls.  Skin: Negative for  itching and rash.  Neurological: Negative for dizziness, tingling, tremors, sensory change, speech change, focal weakness, seizures, loss of consciousness, weakness and headaches.  Endo/Heme/Allergies: Negative for environmental allergies and polydipsia. Does not bruise/bleed easily.  Psychiatric/Behavioral: Negative for depression, suicidal ideas, hallucinations, memory loss and substance abuse. The patient is not nervous/anxious and does not have insomnia.        Objective:  Blood pressure (!) 91/53, pulse 75, height 5\' 1"  (1.549 m), weight 216 lb (98 kg).   Physical Exam  Vitals reviewed. Constitutional: She  is oriented to person, place, and time. She appears well-developed and well-nourished.  HENT:  Head: Normocephalic and atraumatic.        Right Ear: External ear normal.  Left Ear: External ear normal.  Nose: Nose normal.  Mouth/Throat: Oropharynx is clear and moist.  Eyes: Conjunctivae and EOM are normal. Pupils are equal, round, and reactive to light. Right eye exhibits no discharge. Left eye exhibits no discharge. No scleral icterus.  Neck: Normal range of motion. Neck supple. No tracheal deviation present. No thyromegaly present.  Cardiovascular: Normal rate, regular rhythm, normal heart sounds and intact distal pulses.  Exam reveals no gallop and no friction rub.   No murmur heard. Respiratory: Effort normal and breath sounds normal. No respiratory distress. She has no wheezes. She has no rales. She exhibits no tenderness.  GI: Soft. Bowel sounds are normal. She exhibits no distension and no mass. There is no tenderness. There is no rebound and no guarding.  Genitourinary:  Breasts no masses skin changes or nipple changes bilaterally      Vulva is normal without lesions Vagina is pink moist without discharge Cervix normal in appearance and pap is done Uterus is normal size shape and contour Adnexa is negative with normal sized ovaries  +yeast vulvitis, painted with  GV Musculoskeletal: Normal range of motion. She exhibits no edema and no tenderness.  Neurological: She is alert and oriented to person, place, and time. She has normal reflexes. She displays normal reflexes. No cranial nerve deficit. She exhibits normal muscle tone. Coordination normal.  Skin: Skin is warm and dry. No rash noted. No erythema. No pallor.  Psychiatric: She has a normal mood and affect. Her behavior is normal. Judgment and thought content normal.       Medications Ordered at today's visit: Meds ordered this encounter  Medications   fluconazole (DIFLUCAN) 100 MG tablet    Sig: Take 1 tablet (100 mg total) by mouth daily.    Dispense:  7 tablet    Refill:  0    Other orders placed at today's visit: No orders of the defined types were placed in this encounter.    ASSESSMENT + PLAN:    ICD-10-CM   1. Well woman exam with routine gynecological exam  Z01.419 Cytology - PAP( [place])    2. 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

## 2023-10-12 LAB — CYTOLOGY - PAP
Comment: NEGATIVE
Diagnosis: NEGATIVE
High risk HPV: NEGATIVE

## 2023-10-19 ENCOUNTER — Other Ambulatory Visit: Payer: Self-pay | Admitting: "Endocrinology

## 2023-11-15 ENCOUNTER — Telehealth: Payer: Self-pay

## 2023-11-15 NOTE — Telephone Encounter (Signed)
 Pt called stating she had been scheduled for knee replacement. States when they checked her HgbA1c it resulted 13 and surgery has been postponed until pt can bring her A1c down below 7.5. Pt states she is taking Humalog  75/25 60 units bid and her Ozempic  has been on hold since 10/28/23 in anticipation of surgery. Pt brought her CGM device to the office, data uploaded and reviewed by Hulon Magic, FNP. Advised pt to increase Humalog  75/25 to 80 units each morning with breakfast and 60 units with supper. Restart Ozempic  2mg  weekly. Encouraged pt to be sure eats 3 well balanced meals, avoid drinks with lots of sugar, drink water only per Hulon Magic, FNP. Pt stated it is difficult to eat when taking Ozempic  2mg , she eats 4-5 bites and feels full and if she tries to eat more she becomes sick. Discussed with Whitney and advised her to reduce her Ozempic  to 1mg  weekly per Whitney Reardon,FNP. Pt voiced understanding.

## 2023-12-11 LAB — T4, FREE: Free T4: 0.93 ng/dL (ref 0.82–1.77)

## 2023-12-11 LAB — COMPREHENSIVE METABOLIC PANEL WITH GFR
ALT: 15 IU/L (ref 0–32)
AST: 20 IU/L (ref 0–40)
Albumin: 4 g/dL (ref 3.8–4.9)
Alkaline Phosphatase: 159 IU/L — ABNORMAL HIGH (ref 44–121)
BUN/Creatinine Ratio: 15 (ref 9–23)
BUN: 15 mg/dL (ref 6–24)
Bilirubin Total: 0.4 mg/dL (ref 0.0–1.2)
CO2: 24 mmol/L (ref 20–29)
Calcium: 9.8 mg/dL (ref 8.7–10.2)
Chloride: 99 mmol/L (ref 96–106)
Creatinine, Ser: 0.99 mg/dL (ref 0.57–1.00)
Globulin, Total: 2.6 g/dL (ref 1.5–4.5)
Glucose: 262 mg/dL — ABNORMAL HIGH (ref 70–99)
Potassium: 4.3 mmol/L (ref 3.5–5.2)
Sodium: 138 mmol/L (ref 134–144)
Total Protein: 6.6 g/dL (ref 6.0–8.5)
eGFR: 68 mL/min/{1.73_m2} (ref >59)

## 2023-12-11 LAB — TSH: TSH: 1.12 u[IU]/mL (ref 0.450–4.500)

## 2023-12-11 LAB — LIPID PANEL
Chol/HDL Ratio: 2.4 ratio (ref 0.0–4.4)
Cholesterol, Total: 200 mg/dL — ABNORMAL HIGH (ref 100–199)
HDL: 83 mg/dL (ref >39)
LDL Chol Calc (NIH): 99 mg/dL (ref 0–99)
Triglycerides: 105 mg/dL (ref 0–149)
VLDL Cholesterol Cal: 18 mg/dL (ref 5–40)

## 2023-12-20 ENCOUNTER — Ambulatory Visit (INDEPENDENT_AMBULATORY_CARE_PROVIDER_SITE_OTHER): Payer: 59 | Admitting: "Endocrinology

## 2023-12-20 ENCOUNTER — Encounter: Payer: Self-pay | Admitting: "Endocrinology

## 2023-12-20 VITALS — BP 110/54 | HR 60 | Ht 61.0 in | Wt 221.4 lb

## 2023-12-20 DIAGNOSIS — E782 Mixed hyperlipidemia: Secondary | ICD-10-CM | POA: Diagnosis not present

## 2023-12-20 DIAGNOSIS — I10 Essential (primary) hypertension: Secondary | ICD-10-CM | POA: Diagnosis not present

## 2023-12-20 DIAGNOSIS — E1165 Type 2 diabetes mellitus with hyperglycemia: Secondary | ICD-10-CM

## 2023-12-20 DIAGNOSIS — Z794 Long term (current) use of insulin: Secondary | ICD-10-CM | POA: Diagnosis not present

## 2023-12-20 LAB — POCT GLYCOSYLATED HEMOGLOBIN (HGB A1C): HbA1c, POC (controlled diabetic range): 10.7 % — AB (ref 0.0–7.0)

## 2023-12-20 MED ORDER — SEMAGLUTIDE (1 MG/DOSE) 4 MG/3ML ~~LOC~~ SOPN: 1.0000 mg | PEN_INJECTOR | SUBCUTANEOUS | 1 refills | Status: DC

## 2023-12-20 MED ORDER — INSULIN LISPRO PROT & LISPRO (75-25 MIX) 100 UNIT/ML KWIKPEN: PEN_INJECTOR | SUBCUTANEOUS | 1 refills | Status: DC

## 2023-12-20 NOTE — Patient Instructions (Signed)

## 2023-12-20 NOTE — Progress Notes (Signed)
 12/20/2023, 6:09 PM                                                     Endocrinology follow-up note   Subjective:    Patient ID: Lindsey Vazquez, female    DOB: Dec 14, 1970.  Lindsey Vazquez is being seen in follow-up for management of currently uncontrolled symptomatic type 2 diabetes, hyperlipidemia, vitamin D  deficiency.  PCP:   Jonah Negus, NP.   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/2021   Social History   Socioeconomic History   Marital status: Married    Spouse name: Not on file   Number of children: Not on file   Years of education: Not on file   Highest education level: Not on file  Occupational History   Not on file  Tobacco Use   Smoking status: Former    Current packs/day: 0.00    Types: Cigarettes    Quit date: 07/25/2015    Years since quitting: 8.4   Smokeless tobacco: Never  Vaping Use   Vaping status: Former  Substance and Sexual Activity   Alcohol use: No    Comment: socially   Drug use: No   Sexual activity: Yes  Other Topics Concern   Not on file  Social History Narrative   Not on file   Social Drivers of Health   Financial Resource Strain: Low Risk  (10/11/2023)   Overall Financial Resource Strain (CARDIA)    Difficulty of Paying Living Expenses: Not very hard  Food Insecurity: No Food Insecurity (10/11/2023)   Hunger Vital Sign    Worried About Running Out of Food in the Last Year: Never true    Ran Out of Food in the Last Year: Never true  Transportation Needs: No Transportation Needs (10/11/2023)   PRAPARE - Administrator, Civil Service (Medical): No    Lack of Transportation (Non-Medical): No  Physical Activity: Sufficiently Active (10/11/2023)    Exercise Vital Sign    Days of Exercise per Week: 5 days    Minutes of Exercise per Session: 30 min  Stress: No Stress Concern Present (10/11/2023)   [employer] of Occupational Health - Occupational Stress Questionnaire    Feeling of Stress : Not at all  Social Connections: Socially Integrated (10/11/2023)   Social Connection and Isolation Panel [NHANES]    Frequency of Communication with Friends and Family: Three times a week    Frequency of Social Gatherings with Friends and Family: Once a week    Attends Religious Services: More than 4 times per year    Active Member of [employer] or Organizations: Yes    Attends Banker Meetings: More than 4 times per year    Marital Status: Married   Outpatient Encounter Medications as of 12/20/2023  Medication Sig   Semaglutide , 1 MG/DOSE, 4 MG/3ML SOPN Inject 1 mg as directed once a week.   acetaminophen  (TYLENOL ) 500 MG tablet Take 1 tablet (500 mg total) by mouth every 6 (  six) hours as needed for mild pain, fever or headache.   apixaban  (ELIQUIS ) 5 MG TABS tablet Take 1 tablet (5 mg total) by mouth 2 (two) times daily. To start after you finish the starter pack   atorvastatin  (LIPITOR) 80 MG tablet Take 80 mg by mouth daily.   Cholecalciferol 50 MCG (2000 UT) TABS Take 1 tablet by mouth daily.   CLINDESSE vaginal cream once. Apply 1 application vaginally every night (Patient not taking: Reported on 10/11/2023)   Continuous Glucose Receiver (FREESTYLE LIBRE 2 READER) DEVI Use to monitor glucose continuously as directed.   Continuous Glucose Sensor (FREESTYLE LIBRE 2 SENSOR) MISC Use to monitor glucose continuously as directed.   fluconazole  (DIFLUCAN ) 100 MG tablet Take 1 tablet (100 mg total) by mouth daily.   Insulin  Lispro Prot & Lispro (HUMALOG  MIX 75/25 KWIKPEN) (75-25) 100 UNIT/ML Kwikpen Inject 80 units each morning with breakfast and 70 units at supper on time and only when glucose is above 90   magnesium  oxide (MAG-OX) 400 (240  Mg) MG tablet Take 1 tablet by mouth daily.   metoprolol  succinate (TOPROL -XL) 50 MG 24 hr tablet Take 50 mg by mouth daily.   Multiple Vitamin (MULTIVITAMIN) tablet Take 1 tablet by mouth daily.   ondansetron  (ZOFRAN -ODT) 4 MG disintegrating tablet 4mg  ODT q4 hours prn nausea/vomit   potassium chloride  SA (KLOR-CON  M) 20 MEQ tablet Take 10 mEq by mouth every morning.   telmisartan (MICARDIS) 20 MG tablet Take 20 mg by mouth every other day.   torsemide (DEMADEX) 20 MG tablet Take 20 mg by mouth daily.   [DISCONTINUED] Insulin  Lispro Prot & Lispro (HUMALOG  MIX 75/25 KWIKPEN) (75-25) 100 UNIT/ML Kwikpen Inject 80 units each morning with breakfast and 60 units at supper.   [DISCONTINUED] Semaglutide , 2 MG/DOSE, 8 MG/3ML SOPN Inject 2 mg as directed once a week. (Patient taking differently: Inject 1 mg as directed once a week.)   No facility-administered encounter medications on file as of 12/20/2023.    ALLERGIES: Allergies  Allergen Reactions   Jardiance [Empagliflozin] Other (See Comments)    Put her in DKA per pt   Metformin  And Related Nausea And Vomiting    VACCINATION STATUS:  There is no immunization history on file for this patient.  Diabetes She presents for her follow-up diabetic visit. She has type 2 diabetes mellitus. Onset time: Diagnosed at approximate age of 44 years, after episode of gestational diabetes. Her disease course has been fluctuating. There are no hypoglycemic associated symptoms. Pertinent negatives for hypoglycemia include no confusion, headaches, pallor or seizures. Associated symptoms include foot paresthesias, polydipsia and polyuria. Pertinent negatives for diabetes include no chest pain and no fatigue. There are no hypoglycemic complications. Symptoms are improving (Since her last visit, she was hospitalized for nausea/vomiting and was diagnosed with diabetes ketoacidosis.). Diabetic complications include nephropathy. Risk factors for coronary artery disease  include diabetes mellitus, dyslipidemia, hypertension, family history, obesity, sedentary lifestyle and tobacco exposure. Current diabetic treatment includes insulin  injections. Her weight is fluctuating minimally. She is following a generally unhealthy diet. When asked about meal planning, she reported none. She rarely participates in exercise. Her home blood glucose trend is fluctuating dramatically. Her breakfast blood glucose range is generally >200 mg/dl. Her lunch blood glucose range is generally >200 mg/dl. Her dinner blood glucose range is generally >200 mg/dl. Her bedtime blood glucose range is generally >200 mg/dl. Her overall blood glucose range is >200 mg/dl. (She presents with fluctuating glycemic profile, slightly improving.  She has  43% time in range, 18% level 1 hyperglycemia, 38% level 2 hyperglycemia.  She has no hypoglycemia.  Her point-of-care A1c is 10.7% improving from 14.9% during her last visit.  Her current average blood glucose is 225 mg per DL.   ) An ACE inhibitor/angiotensin II receptor blocker is not being taken.  Hyperlipidemia This is a chronic problem. The current episode started more than 1 year ago. The problem is uncontrolled. Exacerbating diseases include diabetes and obesity. Pertinent negatives include no chest pain, myalgias or shortness of breath. Current antihyperlipidemic treatment includes statins. Risk factors for coronary artery disease include dyslipidemia, diabetes mellitus, hypertension, family history, obesity and a sedentary lifestyle.  Hypertension This is a chronic problem. The current episode started more than 1 year ago. Pertinent negatives include no chest pain, headaches, palpitations or shortness of breath. Risk factors for coronary artery disease include diabetes mellitus, dyslipidemia, sedentary lifestyle, smoking/tobacco exposure and obesity.    Review of systems  Constitutional: + Minimally fluctuating body weight,  current  Body mass index is  41.83 kg/m. , no fatigue, no subjective hyperthermia, no subjective hypothermia   Objective:    BP (!) 110/54   Pulse 60   Ht 5\' 1"  (1.549 m)   Wt 221 lb 6.4 oz (100.4 kg)   BMI 41.83 kg/m   Wt Readings from Last 3 Encounters:  12/20/23 221 lb 6.4 oz (100.4 kg)  10/11/23 216 lb (98 kg)  09/17/23 216 lb (98 kg)      Physical Exam- Limited  Constitutional:  Body mass index is 41.83 kg/m. , not in acute distress, normal state of mind Abdomen: Skin exam is not convincing for the amount of insulin  she is given to inject.   CMP     Component Value Date/Time   NA 138 12/10/2023 0736   K 4.3 12/10/2023 0736   CL 99 12/10/2023 0736   CO2 24 12/10/2023 0736   GLUCOSE 262 (H) 12/10/2023 0736   GLUCOSE 394 (H) 08/02/2023 1117   BUN 15 12/10/2023 0736   CREATININE 0.99 12/10/2023 0736   CREATININE 0.55 12/29/2019 1004   CALCIUM  9.8 12/10/2023 0736   PROT 6.6 12/10/2023 0736   ALBUMIN 4.0 12/10/2023 0736   AST 20 12/10/2023 0736   ALT 15 12/10/2023 0736   ALKPHOS 159 (H) 12/10/2023 0736   BILITOT 0.4 12/10/2023 0736   GFRNONAA >60 08/02/2023 1117   GFRNONAA 109 12/29/2019 1004   GFRAA 127 12/29/2019 1004   Lipid Panel     Component Value Date/Time   CHOL 200 (H) 12/10/2023 0736   TRIG 105 12/10/2023 0736   HDL 83 12/10/2023 0736   CHOLHDL 2.4 12/10/2023 0736   CHOLHDL 3.3 12/29/2019 1004   LDLCALC 99 12/10/2023 0736   LDLCALC 99 12/29/2019 1004   LABVLDL 18 12/10/2023 0736     Assessment & Plan:   1. Uncontrolled type 2 diabetes mellitus with hyperglycemia, CKD   - Kymari G Wahlert has currently uncontrolled symptomatic type 2 DM since 53 years of age.  She presents with fluctuating glycemic profile, slightly improving.  She has 43% time in range, 18% level 1 hyperglycemia, 38% level 2 hyperglycemia.  She has no hypoglycemia.  Her point-of-care A1c is 10.7% improving from 14.9% during her last visit.  Her current average blood glucose is 225 mg per DL.   She  is benefiting from Ozempic , however did not tolerate 2 mg .  -She did not have  hypoglycemic episodes since last visit.   Recent labs  reviewed, which also shows significant vitamin D  deficiency.  -her diabetes is complicated by DKA, obesity, and sedentary life and she remains at a high risk for more acute and chronic complications which include CAD, CVA, CKD, retinopathy, and neuropathy. These are all discussed in detail with her.  - I have counseled her on diet management and weight loss, by adopting a carbohydrate restricted/protein rich diet. This patient has not engaged in lifestyle medicine.  She is in fact the best candidate for lifestyle medicine in light of the fact that she is losing control of diabetes despite recent bariatric surgery.  - she acknowledges that there is a room for improvement in her food and drink choices. - Suggestion is made for her to avoid simple carbohydrates  from her diet including Cakes, Sweet Desserts, Ice Cream, Soda (diet and regular), Sweet Tea, Candies, Chips, Cookies, Store Bought Juices, Alcohol , Artificial Sweeteners,  Coffee Creamer, and "Sugar-free" Products, Lemonade. This will help patient to have more stable blood glucose profile and potentially avoid unintended weight gain.  The following Lifestyle Medicine recommendations according to American College of Lifestyle Medicine  [facility]) were discussed and and offered to patient and she  agrees to start the journey:  A. Whole Foods, Plant-Based Nutrition comprising of fruits and vegetables, plant-based proteins, whole-grain carbohydrates was discussed in detail with the patient.   A list for source of those nutrients were also provided to the patient.  Patient will use only water or unsweetened tea for hydration. B.  The need to stay away from risky substances including alcohol, smoking; obtaining 7 to 9 hours of restorative sleep, at least 150 minutes of moderate intensity exercise weekly, the importance of  healthy social connections,  and stress management techniques were discussed. C.  A full color page of  Calorie density of various food groups per pound showing examples of each food groups was provided to the patient.   - she is advised to stick to a routine mealtimes to eat 3 meals  a day and avoid unnecessary snacks ( to snack only to correct hypoglycemia).   - I have approached her with the following individualized plan to manage diabetes and patient agrees:    -It is doubtful that this patient follows and connects the recommended treatment most of the time.    She will continue to need multiple daily injections of insulin  in order for her to achieve control of diabetes to target.  Since she is consuming 2 meals a day, premixed insulin  twice a day is  still preferred over MDI or U500.  -I discussed and increased her Humalog  75/25 to 80 units with breakfast and 70 units with supper  for Premeal blood glucose readings above 90 mg per DL.   She is advised to stay off of metformin  for now.  She is encouraged to continue to use her CGM continuously.  -She is not tolerating Ozempic  at 2 mg, discussed and lowered her Ozempic  to 1 mg subcutaneously weekly.  Side effects and precautions discussed with her.  - she is encouraged to call clinic for blood glucose levels less than 70 or above 200 mg /dl weekly average.   -Advised to stay away from SGLT2 inhibitors given her recent history of euglycemic DKA while taking Jardiance.   - Patient specific target  A1c;  LDL, HDL, Triglycerides were discussed in detail.  2) BP/HTN:  -Her blood pressure is controlled to target.    she is currently not taking any blood pressure  medications.    3) Lipids/HPL: She has not engaged with lifestyle medicine nutrition.  Her LDL is improving to 99.  She is advised to continue atorvastatin  20 mg p.o. nightly.     She may need intervention with Repatha on subsequent visits.  Patient has questionable compliance with all  of her medications.  Side effects and precautions discussed with her.  4) vitamin  D deficiency- 25 hydroxy vitamin D  remains low despite a course of treatment with vitamin D3 5000 units for 90 days .she is approached for retreatment with vitamin D3 5000 units daily for the next 90 days.    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   6) Chronic Care/Health Maintenance:  -she  is on Statin medications and  is encouraged to initiate and continue to follow up with Ophthalmology, Dentist, nephrology, podiatrist at least yearly or according to recommendations, and advised to   stay away from smoking. I have recommended yearly flu vaccine and pneumonia vaccine at least every 5 years; moderate intensity exercise for up to 150 minutes weekly; and  sleep for at least 7 hours a day.  - I advised patient to maintain close follow up with Strader, Lindsey F, NP for primary care needs.   I spent 43  minutes in the care of the patient today including review of labs from CMP, Lipids, Thyroid Function, Hematology (current and previous including abstractions from other facilities); face-to-face time discussing  her blood glucose readings/logs, discussing hypoglycemia and hyperglycemia episodes and symptoms, medications doses, her options of short and long term treatment based on the latest standards of care / guidelines;  discussion about incorporating lifestyle medicine;  and documenting the encounter. Risk reduction counseling performed per USPSTF guidelines to reduce  obesity and cardiovascular risk factors.     Please refer to Patient Instructions for Blood Glucose Monitoring and Insulin /Medications Dosing Guide"  in media tab for additional information. Please  also refer to " Patient Self Inventory" in the Media  tab for reviewed elements of pertinent patient history.  Lindsey Vazquez participated in the discussions,  expressed understanding, and voiced agreement with the above plans.  All questions were answered to her satisfaction. she is encouraged to contact clinic should she have any questions or concerns prior to her return visit.     Follow up plan: - Return in about 3 months (around 03/21/2024) for Bring Meter/CGM Device/Logs- A1c in Office.  Kalvin Orf, MD [facility] [address] [place], [place] [address] Phone: [phone]  Fax: [phone]    12/20/2023, 6:09 PM  This note was partially dictated with voice recognition software. Similar sounding words can be transcribed inadequately or may not  be corrected upon review.

## 2024-01-31 ENCOUNTER — Other Ambulatory Visit (HOSPITAL_COMMUNITY)
Admission: RE | Admit: 2024-01-31 | Discharge: 2024-01-31 | Disposition: A | Discharge status: Home / Self Care | Source: Ambulatory Visit | Attending: Obstetrics & Gynecology | Admitting: Obstetrics & Gynecology

## 2024-01-31 ENCOUNTER — Ambulatory Visit: Admitting: *Deleted

## 2024-01-31 DIAGNOSIS — N898 Other specified noninflammatory disorders of vagina: Secondary | ICD-10-CM

## 2024-01-31 NOTE — Progress Notes (Signed)
   NURSE VISIT- VAGINITIS/STD/POC  SUBJECTIVE:  Lindsey Vazquez is a 53 y.o. G1P1 GYN patientfemale here for a vaginal swab for vaginitis screening.  She reports the following symptoms: discharge described as white and vulvar itching for several days. Denies abnormal vaginal bleeding, significant pelvic pain, fever, or UTI symptoms.  OBJECTIVE:  There were no vitals taken for this visit.  Appears well, in no apparent distress  ASSESSMENT: Vaginal swab for vaginitis screening  PLAN: Self-collected vaginal probe for Bacterial Vaginosis, Yeast sent to lab Treatment: to be determined once results are received Follow-up as needed if symptoms persist/worsen, or new symptoms develop  Alan LITTIE Fischer  01/31/2024 3:44 PM

## 2024-02-04 ENCOUNTER — Ambulatory Visit: Payer: Self-pay | Admitting: Adult Health

## 2024-02-04 LAB — CERVICOVAGINAL ANCILLARY ONLY
Bacterial Vaginitis (gardnerella): NEGATIVE
Candida Glabrata: NEGATIVE
Candida Vaginitis: POSITIVE — AB
Comment: NEGATIVE
Comment: NEGATIVE
Comment: NEGATIVE

## 2024-02-04 MED ORDER — FLUCONAZOLE 150 MG PO TABS: ORAL_TABLET | ORAL | 1 refills | Status: DC

## 2024-02-11 ENCOUNTER — Other Ambulatory Visit: Payer: Self-pay | Admitting: "Endocrinology

## 2024-02-11 DIAGNOSIS — E1165 Type 2 diabetes mellitus with hyperglycemia: Secondary | ICD-10-CM

## 2024-03-17 ENCOUNTER — Ambulatory Visit
Admission: EM | Admit: 2024-03-17 | Discharge: 2024-03-17 | Disposition: A | Discharge status: Home / Self Care | Attending: Nurse Practitioner | Admitting: Nurse Practitioner

## 2024-03-17 DIAGNOSIS — R399 Unspecified symptoms and signs involving the genitourinary system: Secondary | ICD-10-CM | POA: Diagnosis not present

## 2024-03-17 LAB — POCT URINE DIPSTICK
Bilirubin, UA: NEGATIVE
Blood, UA: NEGATIVE
Glucose, UA: 500 mg/dL — AB
Ketones, POC UA: NEGATIVE mg/dL
Leukocytes, UA: NEGATIVE
Nitrite, UA: NEGATIVE
Protein Ur, POC: NEGATIVE mg/dL
Spec Grav, UA: <=1.005 — AB (ref 1.010–1.025)
Urobilinogen, UA: 0.2 U/dL
pH, UA: 5.5 (ref 5.0–8.0)

## 2024-03-17 MED ORDER — NITROFURANTOIN MONOHYD MACRO 100 MG PO CAPS: 100.0000 mg | ORAL_CAPSULE | Freq: Two times a day (BID) | ORAL | 0 refills | Status: DC

## 2024-03-17 NOTE — ED Provider Notes (Signed)
 [facility]    CSN: [id number] Arrival date & time: 03/17/24  1432      History   Chief Complaint No chief complaint on file.   HPI Lindsey Vazquez is a 53 y.o. female.   The history is provided by the patient.   Patient presents for complaints of hematuria, dysuria, and nausea.  Patient states nausea started 2 days ago with the hematuria and dysuria starting this morning.  Patient denies fever, chills, chest pain, abdominal pain, flank pain, low back pain, urinary frequency, urgency, hesitancy, or vaginal symptoms.  Patient states that she has never had a UTI before.  Patient reports that she is also on Eliquis  for history of PE.  So far, she has not tried any medications for her symptoms.  Past Medical History:  Diagnosis Date   Diabetes mellitus without complication (HCC)    Hyperlipemia    Hypertension     Patient Active Problem List   Diagnosis Date Noted   Insulin  long-term use (HCC) 09/17/2023   Pulmonary embolism (HCC) 11/23/2022   CAP (community acquired pneumonia) 11/23/2022   Acute hypoxic respiratory failure (HCC) 11/23/2022   Non-adherence to medical treatment 03/15/2022   Diarrhea 10/28/2021   Intractable nausea and vomiting    DKA (diabetic ketoacidosis) (HCC) 09/10/2021   Morbid obesity (HCC) 09/10/2021   Paroxysmal atrial fibrillation (HCC) 09/10/2021   Gastritis 09/10/2021   OSA (obstructive sleep apnea) 01/14/2021   Other hypersomnia 01/14/2021   Hypercalcemia 01/04/2021   Vitamin D  deficiency 05/23/2018   Uncontrolled type 2 diabetes mellitus with hyperglycemia (HCC) 04/15/2018   Essential hypertension, benign 04/15/2018   Mixed hyperlipidemia 04/15/2018   MPN (myeloproliferative neoplasm) (HCC) 01/22/2018   Diabetes (HCC) 02/07/2017   Leukocytosis 02/07/2017    Past Surgical History:  Procedure Laterality Date   c-section     CARDIAC CATHETERIZATION  07/2021   LAPAROSCOPIC GASTRIC SLEEVE RESECTION  08/11/2021    OB  History     Gravida  1   Para  1   Term      Preterm      AB      Living  1      SAB      IAB      Ectopic      Multiple      Live Births               Home Medications    Prior to Admission medications   Medication Sig Start Date End Date Taking? Authorizing Provider  nitrofurantoin , macrocrystal-monohydrate, (MACROBID ) 100 MG capsule Take 1 capsule (100 mg total) by mouth 2 (two) times daily. 03/17/24  Yes Leath-Warren, Etta PARAS, NP  acetaminophen  (TYLENOL ) 500 MG tablet Take 1 tablet (500 mg total) by mouth every 6 (six) hours as needed for mild pain, fever or headache. 09/12/21   Ricky Fines, MD  apixaban  (ELIQUIS ) 5 MG TABS tablet Take 1 tablet (5 mg total) by mouth 2 (two) times daily. To start after you finish the starter pack 12/26/22   Juvenal Raisin U, DO  atorvastatin  (LIPITOR) 80 MG tablet Take 80 mg by mouth daily.    [provider]  Cholecalciferol 50 MCG (2000 UT) TABS Take 1 tablet by mouth daily.    [provider]  CLINDESSE vaginal cream once. Apply 1 application vaginally every night Patient not taking: Reported on 10/11/2023 07/23/23   [provider]  Continuous Glucose Receiver (FREESTYLE LIBRE 2 READER) DEVI Use to monitor glucose continuously  as directed. 08/21/23   Lenis Ethelle ORN, MD  Continuous Glucose Sensor (FREESTYLE LIBRE 2 SENSOR) MISC CHANGE SENSOR AS DIRECTED EVERY 14 DAYS. 02/13/24   Nida, Gebreselassie W, MD  fluconazole  (DIFLUCAN ) 150 MG tablet Take 1 now and 1 in 3 days 02/04/24   Signa Delon LABOR, NP  Insulin  Lispro Prot & Lispro (HUMALOG  MIX 75/25 KWIKPEN) (75-25) 100 UNIT/ML Kwikpen Inject 80 units each morning with breakfast and 70 units at supper on time and only when glucose is above 90 12/20/23   Nida, Ethelle ORN, MD  magnesium  oxide (MAG-OX) 400 (240 Mg) MG tablet Take 1 tablet by mouth daily. 06/27/21   [provider]  metoprolol  succinate (TOPROL -XL) 50 MG 24 hr tablet Take  50 mg by mouth daily.    [provider]  Multiple Vitamin (MULTIVITAMIN) tablet Take 1 tablet by mouth daily.    [provider]  ondansetron  (ZOFRAN -ODT) 4 MG disintegrating tablet 4mg  ODT q4 hours prn nausea/vomit 08/02/23   Zammit, Joseph, MD  potassium chloride  SA (KLOR-CON  M) 20 MEQ tablet Take 10 mEq by mouth every morning.    [provider]  Semaglutide , 1 MG/DOSE, 4 MG/3ML SOPN Inject 1 mg as directed once a week. 12/20/23   Nida, Gebreselassie W, MD  telmisartan (MICARDIS) 20 MG tablet Take 20 mg by mouth every other day.    [provider]  torsemide (DEMADEX) 20 MG tablet Take 20 mg by mouth daily.    [provider]    Family History History reviewed. No pertinent family history.  Social History Social History   Tobacco Use   Smoking status: Former    Current packs/day: 0.00    Types: Cigarettes    Quit date: 07/25/2015    Years since quitting: 8.6   Smokeless tobacco: Never  Vaping Use   Vaping status: Former  Substance Use Topics   Alcohol use: No    Comment: socially   Drug use: No     Allergies   Jardiance [empagliflozin] and Metformin  and related   Review of Systems Review of Systems Per HPI  Physical Exam Triage Vital Signs ED Triage Vitals  Encounter Vitals Group     BP 03/17/24 1444 (!) 92/58     Girls Systolic BP Percentile --      Girls Diastolic BP Percentile --      Boys Systolic BP Percentile --      Boys Diastolic BP Percentile --      Pulse Rate 03/17/24 1444 100     Resp 03/17/24 1444 18     Temp 03/17/24 1444 98.4 F (36.9 C)     Temp Source 03/17/24 1444 Oral     SpO2 03/17/24 1444 94 %     Weight --      Height --      Head Circumference --      Peak Flow --      Pain Score 03/17/24 1447 0     Pain Loc --      Pain Education --      Exclude from Growth Chart --    No data found.  Updated Vital Signs BP (!) 92/58 (BP Location: Right Arm)   Pulse 100   Temp 98.4 F (36.9 C)  (Oral)   Resp 18   SpO2 94%   Visual Acuity Right Eye Distance:   Left Eye Distance:   Bilateral Distance:    Right Eye Near:   Left Eye Near:  Bilateral Near:     Physical Exam Vitals and nursing note reviewed.  Constitutional:      General: She is not in acute distress.    Appearance: Normal appearance.  HENT:     Head: Normocephalic.  Eyes:     Extraocular Movements: Extraocular movements intact.     Pupils: Pupils are equal, round, and reactive to light.  Cardiovascular:     Rate and Rhythm: Normal rate and regular rhythm.     Pulses: Normal pulses.     Heart sounds: Normal heart sounds.  Pulmonary:     Effort: Pulmonary effort is normal. No respiratory distress.     Breath sounds: Normal breath sounds. No stridor. No wheezing, rhonchi or rales.  Abdominal:     General: Bowel sounds are normal.     Palpations: Abdomen is soft.     Tenderness: There is no abdominal tenderness. There is no right CVA tenderness or left CVA tenderness.  Musculoskeletal:     Cervical back: Normal range of motion.  Skin:    General: Skin is warm and dry.  Neurological:     General: No focal deficit present.     Mental Status: She is alert and oriented to person, place, and time.  Psychiatric:        Mood and Affect: Mood normal.        Behavior: Behavior normal.      UC Treatments / Results  Labs (all labs ordered are listed, but only abnormal results are displayed) Labs Reviewed  POCT URINE DIPSTICK - Abnormal; Notable for the following components:      Result Value   Color, UA light yellow (*)    Glucose, UA =500 (*)    Spec Grav, UA <=1.005 (*)    All other components within normal limits  URINE CULTURE    EKG   Radiology No results found.  Procedures Procedures (including critical care time)  Medications Ordered in UC Medications - No data to display  Initial Impression / Assessment and Plan / UC Course  I have reviewed the triage vital signs and the  nursing notes.  Pertinent labs & imaging results that were available during my care of the patient were reviewed by me and considered in my medical decision making (see chart for details).  Urinalysis with elevated specific gravity and glucose.  Patient reports dysuria and hematuria that started this morning.  Urinalysis does not indicate an obvious infection, urine culture is pending.  In the interim, we will treat patient's symptoms for possible UTI with Macrobid  100 mg.  Supportive care recommendations were provided and discussed with the patient to include fluids, over-the-counter analgesics, developing toileting schedule, and avoiding caffeine.  Patient was advised if the hematuria worsens, recommend reaching out to her PCP regarding discontinuing the Eliquis .  Patient was advised that if the urine culture is negative and she is continue to experience symptoms, recommend follow-up with her PCP for further evaluation.  Patient was in agreement with this plan of care and verbalizes understanding.  All questions were answered.  Patient stable for discharge.  Final Clinical Impressions(s) / UC Diagnoses   Final diagnoses:  UTI symptoms   Discharge Instructions   None    ED Prescriptions     Medication Sig Dispense Auth. Provider   nitrofurantoin , macrocrystal-monohydrate, (MACROBID ) 100 MG capsule Take 1 capsule (100 mg total) by mouth 2 (two) times daily. 10 capsule Leath-Warren, Etta PARAS, NP      PDMP not reviewed this  encounter.   Gilmer Etta PARAS, NP 03/17/24 [phone]

## 2024-03-17 NOTE — Discharge Instructions (Signed)
 Urine culture is pending.  You will be contacted when the results of the culture are received.  If the results of the culture are negative and you are continuing to experience symptoms, please follow-up with your primary care physician for further evaluation. Take medication as prescribed. Continue to drink plenty of fluids. You may take over-the-counter Tylenol  as needed for pain, fever, or general discomfort. Develop a toileting schedule that will allow you to urinate at least every 2 hours. Avoid caffeine such as tea, soda, or coffee while symptoms persist. If you begin to have increased blood in your urine, please reach out to your primary care physician regarding your Eliquis . Follow-up as needed.

## 2024-03-17 NOTE — ED Triage Notes (Signed)
 Pt reports she has burning with urination, blood in urine (on eliquis ) x 1 day reports some nausea x 2 days

## 2024-03-19 LAB — URINE CULTURE

## 2024-03-21 ENCOUNTER — Ambulatory Visit (HOSPITAL_COMMUNITY): Payer: Self-pay

## 2024-03-25 ENCOUNTER — Encounter: Payer: Self-pay | Admitting: "Endocrinology

## 2024-03-25 ENCOUNTER — Ambulatory Visit: Admitting: "Endocrinology

## 2024-03-25 VITALS — BP 106/62 | HR 72 | Ht 61.0 in | Wt 204.8 lb

## 2024-03-25 DIAGNOSIS — E782 Mixed hyperlipidemia: Secondary | ICD-10-CM | POA: Diagnosis not present

## 2024-03-25 DIAGNOSIS — I10 Essential (primary) hypertension: Secondary | ICD-10-CM | POA: Diagnosis not present

## 2024-03-25 DIAGNOSIS — E1165 Type 2 diabetes mellitus with hyperglycemia: Secondary | ICD-10-CM | POA: Diagnosis not present

## 2024-03-25 DIAGNOSIS — Z794 Long term (current) use of insulin: Secondary | ICD-10-CM | POA: Diagnosis not present

## 2024-03-25 DIAGNOSIS — Z91199 Patient's noncompliance with other medical treatment and regimen due to unspecified reason: Secondary | ICD-10-CM

## 2024-03-25 LAB — POCT GLYCOSYLATED HEMOGLOBIN (HGB A1C): HbA1c, POC (controlled diabetic range): 12.6 % — AB (ref 0.0–7.0)

## 2024-03-25 LAB — POCT UA - MICROALBUMIN
Albumin/Creatinine Ratio, Urine, POC: <30
Creatinine, POC: 300 mg/dL
Microalbumin Ur, POC: 30 mg/L

## 2024-03-25 MED ORDER — FREESTYLE LIBRE 2 PLUS SENSOR MISC: 3 refills | Status: DC

## 2024-03-25 MED ORDER — INSULIN LISPRO PROT & LISPRO (75-25 MIX) 100 UNIT/ML KWIKPEN: PEN_INJECTOR | SUBCUTANEOUS | 1 refills | Status: DC

## 2024-03-25 MED ORDER — FREESTYLE LIBRE 2 READER DEVI: 0 refills | Status: DC

## 2024-03-25 MED ORDER — TIRZEPATIDE 2.5 MG/0.5ML ~~LOC~~ SOAJ: 2.5000 mg | SUBCUTANEOUS | 0 refills | Status: DC

## 2024-03-25 NOTE — Patient Instructions (Signed)

## 2024-03-25 NOTE — Progress Notes (Signed)
 03/25/2024, 4:42 PM                                                     Endocrinology follow-up note   Subjective:    Patient ID: Lindsey Vazquez, female    DOB: 01-13-1971.  Lindsey Vazquez is being seen in follow-up for management of currently uncontrolled symptomatic type 2 diabetes, hyperlipidemia, vitamin D  deficiency.  PCP:   Johnson Morna FALCON, NP.   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/2021   Social History   Socioeconomic History   Marital status: Married    Spouse name: Not on file   Number of children: Not on file   Years of education: Not on file   Highest education level: Not on file  Occupational History   Not on file  Tobacco Use   Smoking status: Former    Current packs/day: 0.00    Types: Cigarettes    Quit date: 07/25/2015    Years since quitting: 8.6   Smokeless tobacco: Never  Vaping Use   Vaping status: Former  Substance and Sexual Activity   Alcohol use: No    Comment: socially   Drug use: No   Sexual activity: Yes  Other Topics Concern   Not on file  Social History Narrative   Not on file   Social Drivers of Health   Financial Resource Strain: Low Risk  (10/11/2023)   Overall Financial Resource Strain (CARDIA)    Difficulty of Paying Living Expenses: Not very hard  Food Insecurity: No Food Insecurity (10/11/2023)   Hunger Vital Sign    Worried About Running Out of Food in the Last Year: Never true    Ran Out of Food in the Last Year: Never true  Transportation Needs: No Transportation Needs (10/11/2023)   PRAPARE - Administrator, Civil Service (Medical): No    Lack of Transportation (Non-Medical): No  Physical Activity: Sufficiently Active (10/11/2023)    Exercise Vital Sign    Days of Exercise per Week: 5 days    Minutes of Exercise per Session: 30 min  Stress: No Stress Concern Present (10/11/2023)   [employer] of Occupational Health - Occupational Stress Questionnaire    Feeling of Stress : Not at all  Social Connections: Socially Integrated (10/11/2023)   Social Connection and Isolation Panel    Frequency of Communication with Friends and Family: Three times a week    Frequency of Social Gatherings with Friends and Family: Once a week    Attends Religious Services: More than 4 times per year    Active Member of [employer] or Organizations: Yes    Attends Banker Meetings: More than 4 times per year    Marital Status: Married   Outpatient Encounter Medications as of 03/25/2024  Medication Sig   Continuous Glucose Sensor (FREESTYLE LIBRE 2 PLUS SENSOR) MISC Change sensor every 15 days.   tirzepatide  (MOUNJARO ) 2.5 MG/0.5ML Pen Inject 2.5 mg into the skin once a week.   [  DISCONTINUED] Insulin  Lispro Prot & Lispro (HUMALOG  MIX 75/25 KWIKPEN) (75-25) 100 UNIT/ML Kwikpen Inject 80 units each morning with breakfast and 70 units at supper on time and only when glucose is above 90 (Patient taking differently: 60 Units 2 (two) times daily. Inject 80 units each morning with breakfast and 70 units at supper on time and only when glucose is above 90)   acetaminophen  (TYLENOL ) 500 MG tablet Take 1 tablet (500 mg total) by mouth every 6 (six) hours as needed for mild pain, fever or headache.   apixaban  (ELIQUIS ) 5 MG TABS tablet Take 1 tablet (5 mg total) by mouth 2 (two) times daily. To start after you finish the starter pack   atorvastatin  (LIPITOR) 80 MG tablet Take 80 mg by mouth daily.   Cholecalciferol 50 MCG (2000 UT) TABS Take 1 tablet by mouth daily.   CLINDESSE vaginal cream once. Apply 1 application vaginally every night (Patient not taking: Reported on 10/11/2023)   Continuous Glucose Receiver (FREESTYLE LIBRE 2 READER) DEVI Use to  monitor glucose continuously as directed.   Continuous Glucose Sensor (FREESTYLE LIBRE 2 SENSOR) MISC CHANGE SENSOR AS DIRECTED EVERY 14 DAYS.   fluconazole  (DIFLUCAN ) 150 MG tablet Take 1 now and 1 in 3 days   Insulin  Lispro Prot & Lispro (HUMALOG  MIX 75/25 KWIKPEN) (75-25) 100 UNIT/ML Kwikpen Inject 80 units each morning with breakfast and 70 units at supper on time and only when glucose is above 90   magnesium  oxide (MAG-OX) 400 (240 Mg) MG tablet Take 1 tablet by mouth daily.   metoprolol  succinate (TOPROL -XL) 50 MG 24 hr tablet Take 50 mg by mouth daily.   Multiple Vitamin (MULTIVITAMIN) tablet Take 1 tablet by mouth daily.   nitrofurantoin , macrocrystal-monohydrate, (MACROBID ) 100 MG capsule Take 1 capsule (100 mg total) by mouth 2 (two) times daily.   ondansetron  (ZOFRAN -ODT) 4 MG disintegrating tablet 4mg  ODT q4 hours prn nausea/vomit   potassium chloride  SA (KLOR-CON  M) 20 MEQ tablet Take 10 mEq by mouth every morning.   Semaglutide , 1 MG/DOSE, 4 MG/3ML SOPN Inject 1 mg as directed once a week.   telmisartan (MICARDIS) 20 MG tablet Take 20 mg by mouth every other day.   torsemide (DEMADEX) 20 MG tablet Take 20 mg by mouth daily.   [DISCONTINUED] Continuous Glucose Receiver (FREESTYLE LIBRE 2 READER) DEVI Use to monitor glucose continuously as directed.   No facility-administered encounter medications on file as of 03/25/2024.    ALLERGIES: Allergies  Allergen Reactions   Jardiance [Empagliflozin] Other (See Comments)    Put her in DKA per pt   Metformin  And Related Nausea And Vomiting    VACCINATION STATUS:  There is no immunization history on file for this patient.  Diabetes She presents for her follow-up diabetic visit. She has type 2 diabetes mellitus. Onset time: Diagnosed at approximate age of 42 years, after episode of gestational diabetes. Her disease course has been worsening. There are no hypoglycemic associated symptoms. Pertinent negatives for hypoglycemia include no  confusion, headaches, pallor or seizures. Associated symptoms include foot paresthesias, polydipsia and polyuria. Pertinent negatives for diabetes include no chest pain and no fatigue. There are no hypoglycemic complications. Symptoms are worsening (Since her last visit, she was hospitalized for nausea/vomiting and was diagnosed with diabetes ketoacidosis.). Diabetic complications include nephropathy. Risk factors for coronary artery disease include diabetes mellitus, dyslipidemia, hypertension, family history, obesity, sedentary lifestyle and tobacco exposure. Current diabetic treatment includes insulin  injections. Her weight is fluctuating minimally. She is following a  generally unhealthy diet. When asked about meal planning, she reported none. She rarely participates in exercise. Her home blood glucose trend is increasing steadily. Her breakfast blood glucose range is generally >200 mg/dl. Her lunch blood glucose range is generally >200 mg/dl. Her dinner blood glucose range is generally >200 mg/dl. Her bedtime blood glucose range is generally >200 mg/dl. Her overall blood glucose range is >200 mg/dl. (She presents with significantly above target glycemic profile averaging 229 and point-of-care A1c 12.2% increasing from 10.7%.  She did not call clinic for hyperglycemia and she has down  adjusted the dose of insulin  she was given during her last visit.  Her CGM shows 48% in range, 15% level 1 hyperglycemia, 37% level 2 hyperglycemia.  She has no hypoglycemia.) An ACE inhibitor/angiotensin II receptor blocker is not being taken.  Hyperlipidemia This is a chronic problem. The current episode started more than 1 year ago. The problem is uncontrolled. Exacerbating diseases include diabetes and obesity. Pertinent negatives include no chest pain, myalgias or shortness of breath. Current antihyperlipidemic treatment includes statins. Risk factors for coronary artery disease include dyslipidemia, diabetes mellitus,  hypertension, family history, obesity and a sedentary lifestyle.  Hypertension This is a chronic problem. The current episode started more than 1 year ago. Pertinent negatives include no chest pain, headaches, palpitations or shortness of breath. Risk factors for coronary artery disease include diabetes mellitus, dyslipidemia, sedentary lifestyle, smoking/tobacco exposure and obesity.    Review of systems  Constitutional: + Minimally fluctuating body weight,  current  Body mass index is 38.7 kg/m. , no fatigue, no subjective hyperthermia, no subjective hypothermia   Objective:    BP 106/62   Pulse 72   Ht 5' 1 (1.549 m)   Wt 204 lb 12.8 oz (92.9 kg)   BMI 38.70 kg/m   Wt Readings from Last 3 Encounters:  03/25/24 204 lb 12.8 oz (92.9 kg)  12/20/23 221 lb 6.4 oz (100.4 kg)  10/11/23 216 lb (98 kg)      Physical Exam- Limited  Constitutional:  Body mass index is 38.7 kg/m. , not in acute distress, normal state of mind Abdomen: Skin exam is not convincing for the amount of insulin  she is given to inject.   CMP     Component Value Date/Time   NA 138 12/10/2023 0736   K 4.3 12/10/2023 0736   CL 99 12/10/2023 0736   CO2 24 12/10/2023 0736   GLUCOSE 262 (H) 12/10/2023 0736   GLUCOSE 394 (H) 08/02/2023 1117   BUN 15 12/10/2023 0736   CREATININE 0.99 12/10/2023 0736   CREATININE 0.55 12/29/2019 1004   CALCIUM  9.8 12/10/2023 0736   PROT 6.6 12/10/2023 0736   ALBUMIN 4.0 12/10/2023 0736   AST 20 12/10/2023 0736   ALT 15 12/10/2023 0736   ALKPHOS 159 (H) 12/10/2023 0736   BILITOT 0.4 12/10/2023 0736   GFRNONAA >60 08/02/2023 1117   GFRNONAA 109 12/29/2019 1004   GFRAA 127 12/29/2019 1004   Lipid Panel     Component Value Date/Time   CHOL 200 (H) 12/10/2023 0736   TRIG 105 12/10/2023 0736   HDL 83 12/10/2023 0736   CHOLHDL 2.4 12/10/2023 0736   CHOLHDL 3.3 12/29/2019 1004   LDLCALC 99 12/10/2023 0736   LDLCALC 99 12/29/2019 1004   LABVLDL 18 12/10/2023 0736      Assessment & Plan:   1. Uncontrolled type 2 diabetes mellitus with hyperglycemia, CKD   - Kilynn G Attridge has currently uncontrolled symptomatic type 2 DM  since 53 years of age.  She presents with significantly above target glycemic profile averaging 229 and point-of-care A1c 12.2% increasing from 10.7%.  She did not call clinic for hyperglycemia and she has down  adjusted the dose of insulin  she was given during her last visit.  Her CGM shows 48% in range, 15% level 1 hyperglycemia, 37% level 2 hyperglycemia.  She has no hypoglycemia.  -She did not have  hypoglycemic episodes since last visit.   Recent labs reviewed, which also shows significant vitamin D  deficiency.  -her diabetes is complicated by DKA, obesity, and sedentary life and she remains at a high risk for more acute and chronic complications which include CAD, CVA, CKD, retinopathy, and neuropathy. These are all discussed in detail with her.  - I have counseled her on diet management and weight loss, by adopting a carbohydrate restricted/protein rich diet. This patient has not engaged in lifestyle medicine.  She is in fact the best candidate for lifestyle medicine in light of the fact that she is losing control of diabetes despite recent bariatric surgery.  - she acknowledges that there is a room for improvement in her food and drink choices. - Suggestion is made for her to avoid simple carbohydrates  from her diet including Cakes, Sweet Desserts, Ice Cream, Soda (diet and regular), Sweet Tea, Candies, Chips, Cookies, Store Bought Juices, Alcohol , Artificial Sweeteners,  Coffee Creamer, and Sugar-free Products, Lemonade. This will help patient to have more stable blood glucose profile and potentially avoid unintended weight gain.  The following Lifestyle Medicine recommendations according to American College of Lifestyle Medicine  [facility]) were discussed and and offered to patient and she  agrees to start the journey:   A. Whole Foods, Plant-Based Nutrition comprising of fruits and vegetables, plant-based proteins, whole-grain carbohydrates was discussed in detail with the patient.   A list for source of those nutrients were also provided to the patient.  Patient will use only water or unsweetened tea for hydration. B.  The need to stay away from risky substances including alcohol, smoking; obtaining 7 to 9 hours of restorative sleep, at least 150 minutes of moderate intensity exercise weekly, the importance of healthy social connections,  and stress management techniques were discussed. C.  A full color page of  Calorie density of various food groups per pound showing examples of each food groups was provided to the patient.   - she is advised to stick to a routine mealtimes to eat 3 meals  a day and avoid unnecessary snacks ( to snack only to correct hypoglycemia).   - I have approached her with the following individualized plan to manage diabetes and patient agrees:    -It is doubtful that this patient follows and connects the recommended treatment most of the time.    She he is advised to resume her Humalog  75/25 80 units with breakfast and 70 units with supper  for Premeal blood glucose readings above 90 mg per DL.    She is encouraged to continue to use her CGM and call clinic for weekly average blood glucose more than 200 mg per DL or hypoglycemia under 70 mg per DL. -She may benefit from Mounjaro  as opposed to Ozempic .  I discussed and switch her to Mounjaro  2.5 mg subcutaneously weekly, to advance as she tolerates.   -Advised to stay away from SGLT2 inhibitors given her recent history of euglycemic DKA while taking Jardiance.   - Patient specific target  A1c;  LDL, HDL,  Triglycerides were discussed in detail.  2) BP/HTN:  - Her blood pressure is controlled to target.    she is currently not taking any blood pressure medications.    3) Lipids/HPL: She has not engaged with lifestyle medicine  nutrition.  Her LDL is improving to 99.  She is advised to continue atorvastatin  80 mg p.o. nightly.      She may need intervention with Repatha on subsequent visits.  Patient has questionable compliance with all of her medications.  Side effects and precautions discussed with her.  4) vitamin  D deficiency- 25 hydroxy vitamin D  remains low despite a course of treatment with vitamin D3 5000 units for 90 days .she is approached for retreatment with vitamin D3 5000 units daily for the next 90 days.    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   6) Chronic Care/Health Maintenance:  -she  is on Statin medications and  is encouraged to initiate and continue to follow up with Ophthalmology, Dentist, nephrology, podiatrist at least yearly or according to recommendations, and advised to   stay away from smoking. I have recommended yearly flu vaccine and pneumonia vaccine at least every 5 years; moderate intensity exercise for up to 150 minutes weekly; and  sleep for at least 7 hours a day.  - I advised patient to maintain close follow up with Strader, Lindsey F, NP for primary care needs.   I spent  40  minutes in the care of the patient today including review of labs from CMP, Lipids, Thyroid Function, Hematology (current and previous including abstractions from other facilities); face-to-face time discussing  her blood glucose readings/logs, discussing hypoglycemia and hyperglycemia episodes and symptoms, medications doses, her options of short and long term treatment based on the latest standards of care / guidelines;  discussion about incorporating lifestyle medicine;  and documenting the encounter. Risk reduction counseling performed per USPSTF guidelines to reduce  obesity and cardiovascular risk factors.     Please refer to Patient Instructions for Blood Glucose Monitoring and Insulin /Medications Dosing Guide   in media tab for additional information. Please  also refer to  Patient Self Inventory in the Media  tab for reviewed elements of pertinent patient history.  Lindsey Vazquez participated in the discussions, expressed understanding, and voiced agreement with the above plans.  All questions were answered to her satisfaction. she is encouraged to contact clinic should she have any questions or concerns prior to her return visit.    Follow up plan: - Return in about 4 months (around 07/25/2024) for F/U with Pre-visit Labs, Meter/CGM/Logs, A1c here.  Ranny Earl, MD [facility] [address] [place], [place] [address] Phone: [phone]  Fax: [phone]    03/25/2024, 4:42 PM  This note was partially dictated with voice recognition software. Similar sounding words can be transcribed inadequately or may not  be corrected upon review.

## 2024-06-23 ENCOUNTER — Ambulatory Visit
Admission: EM | Admit: 2024-06-23 | Discharge: 2024-06-23 | Disposition: A | Discharge status: Home / Self Care | Attending: Nurse Practitioner | Admitting: Nurse Practitioner

## 2024-06-23 DIAGNOSIS — R319 Hematuria, unspecified: Secondary | ICD-10-CM | POA: Diagnosis not present

## 2024-06-23 DIAGNOSIS — R399 Unspecified symptoms and signs involving the genitourinary system: Secondary | ICD-10-CM | POA: Diagnosis not present

## 2024-06-23 LAB — POCT URINE DIPSTICK
Bilirubin, UA: NEGATIVE
Glucose, UA: >=1000 mg/dL — AB
Ketones, POC UA: NEGATIVE mg/dL
Nitrite, UA: NEGATIVE
POC PROTEIN,UA: NEGATIVE
Spec Grav, UA: 1.01 (ref 1.010–1.025)
Urobilinogen, UA: 0.2 U/dL
pH, UA: 5.5 (ref 5.0–8.0)

## 2024-06-23 MED ORDER — PHENAZOPYRIDINE HCL 100 MG PO TABS: 100.0000 mg | ORAL_TABLET | Freq: Three times a day (TID) | ORAL | 0 refills | Status: DC | PRN

## 2024-06-23 NOTE — ED Triage Notes (Signed)
 Pt present with c/o blood in urine x 3 days. Pt denies lower abdominal and back pain. Pt reports frequent urination.

## 2024-06-23 NOTE — ED Provider Notes (Signed)
 [facility]    CSN: [id number] Arrival date & time: 06/23/24  1404      History   Chief Complaint Chief Complaint  Patient presents with   Hematuria    HPI Lindsey Vazquez is a 53 y.o. female.   The history is provided by the patient.   Patient presents with a 2-day history of hematuria and urinary frequency.  Patient states that she has noticed blood after wiping with urination.  She also states that she feels like she has pain at the end of her urine stream.  Patient also endorses discomfort after sexual intercourse.  Patient denies fever, chills, chest pain, abdominal pain, flank pain, low back pain, dysuria, decreased urine stream, or vaginal symptoms.  Patient does have underlying history of diabetes.  States that she has been drinking plenty of fluids.  Patient reports she is sexually active, denies concern for STI/STD.  So far, she has not taken any medications for her symptoms.  States that she has not been treated for another UTI since she was seen at this clinic in August.  Denies prior history of kidney stones.  Past Medical History:  Diagnosis Date   Diabetes mellitus without complication (HCC)    Hyperlipemia    Hypertension     Patient Active Problem List   Diagnosis Date Noted   Insulin  long-term use (HCC) 09/17/2023   Pulmonary embolism (HCC) 11/23/2022   CAP (community acquired pneumonia) 11/23/2022   Acute hypoxic respiratory failure (HCC) 11/23/2022   Non-adherence to medical treatment 03/15/2022   Diarrhea 10/28/2021   Intractable nausea and vomiting    DKA (diabetic ketoacidosis) (HCC) 09/10/2021   Morbid obesity (HCC) 09/10/2021   Paroxysmal atrial fibrillation (HCC) 09/10/2021   Gastritis 09/10/2021   OSA (obstructive sleep apnea) 01/14/2021   Other hypersomnia 01/14/2021   Hypercalcemia 01/04/2021   Vitamin D  deficiency 05/23/2018   Uncontrolled type 2 diabetes mellitus with hyperglycemia (HCC) 04/15/2018   Essential  hypertension, benign 04/15/2018   Mixed hyperlipidemia 04/15/2018   MPN (myeloproliferative neoplasm) (HCC) 01/22/2018   Diabetes (HCC) 02/07/2017   Leukocytosis 02/07/2017    Past Surgical History:  Procedure Laterality Date   c-section     CARDIAC CATHETERIZATION  07/2021   LAPAROSCOPIC GASTRIC SLEEVE RESECTION  08/11/2021    OB History     Gravida  1   Para  1   Term      Preterm      AB      Living  1      SAB      IAB      Ectopic      Multiple      Live Births               Home Medications    Prior to Admission medications   Medication Sig Start Date End Date Taking? Authorizing Provider  acetaminophen  (TYLENOL ) 500 MG tablet Take 1 tablet (500 mg total) by mouth every 6 (six) hours as needed for mild pain, fever or headache. 09/12/21   Ricky Fines, MD  apixaban  (ELIQUIS ) 5 MG TABS tablet Take 1 tablet (5 mg total) by mouth 2 (two) times daily. To start after you finish the starter pack 12/26/22   Juvenal Harlene PENNER, DO  atorvastatin  (LIPITOR) 80 MG tablet Take 80 mg by mouth daily.    [provider]  Cholecalciferol 50 MCG (2000 UT) TABS Take 1 tablet by mouth daily.    [provider]  CAESAR  vaginal cream once. Apply 1 application vaginally every night Patient not taking: Reported on 10/11/2023 07/23/23   [provider]  Continuous Glucose Receiver (FREESTYLE LIBRE 2 READER) DEVI Use to monitor glucose continuously as directed. 03/25/24   Nida, Gebreselassie W, MD  Continuous Glucose Sensor (FREESTYLE LIBRE 2 PLUS SENSOR) MISC Change sensor every 15 days. 03/25/24   Lenis Ethelle ORN, MD  Continuous Glucose Sensor (FREESTYLE LIBRE 2 SENSOR) MISC CHANGE SENSOR AS DIRECTED EVERY 14 DAYS. 02/13/24   Nida, Gebreselassie W, MD  fluconazole  (DIFLUCAN ) 150 MG tablet Take 1 now and 1 in 3 days 02/04/24   Signa Delon LABOR, NP  Insulin  Lispro Prot & Lispro (HUMALOG  MIX 75/25 KWIKPEN) (75-25) 100 UNIT/ML Kwikpen Inject 80 units  each morning with breakfast and 70 units at supper on time and only when glucose is above 90 03/25/24   Nida, Ethelle ORN, MD  magnesium  oxide (MAG-OX) 400 (240 Mg) MG tablet Take 1 tablet by mouth daily. 06/27/21   [provider]  metoprolol  succinate (TOPROL -XL) 50 MG 24 hr tablet Take 50 mg by mouth daily.    [provider]  Multiple Vitamin (MULTIVITAMIN) tablet Take 1 tablet by mouth daily.    [provider]  nitrofurantoin , macrocrystal-monohydrate, (MACROBID ) 100 MG capsule Take 1 capsule (100 mg total) by mouth 2 (two) times daily. 03/17/24   Leath-Warren, Etta PARAS, NP  ondansetron  (ZOFRAN -ODT) 4 MG disintegrating tablet 4mg  ODT q4 hours prn nausea/vomit 08/02/23   Zammit, Joseph, MD  potassium chloride  SA (KLOR-CON  M) 20 MEQ tablet Take 10 mEq by mouth every morning.    [provider]  Semaglutide , 1 MG/DOSE, 4 MG/3ML SOPN Inject 1 mg as directed once a week. 12/20/23   Nida, Gebreselassie W, MD  telmisartan (MICARDIS) 20 MG tablet Take 20 mg by mouth every other day.    [provider]  tirzepatide  (MOUNJARO ) 2.5 MG/0.5ML Pen Inject 2.5 mg into the skin once a week. 03/25/24   Nida, Gebreselassie W, MD  torsemide (DEMADEX) 20 MG tablet Take 20 mg by mouth daily.    [provider]    Family History History reviewed. No pertinent family history.  Social History Social History   Tobacco Use   Smoking status: Former    Current packs/day: 0.00    Types: Cigarettes    Quit date: 07/25/2015    Years since quitting: 8.9   Smokeless tobacco: Never  Vaping Use   Vaping status: Former  Substance Use Topics   Alcohol use: No    Comment: socially   Drug use: No     Allergies   Jardiance [empagliflozin] and Metformin  and related   Review of Systems Review of Systems Per HPI  Physical Exam Triage Vital Signs ED Triage Vitals [06/23/24 1521]  Encounter Vitals Group     BP 125/78     Girls Systolic BP Percentile       Girls Diastolic BP Percentile      Boys Systolic BP Percentile      Boys Diastolic BP Percentile      Pulse Rate 71     Resp 18     Temp 98.1 F (36.7 C)     Temp src      SpO2 96 %     Weight      Height      Head Circumference      Peak Flow      Pain Score      Pain Loc  Pain Education      Exclude from Growth Chart    No data found.  Updated Vital Signs BP 125/78 (BP Location: Right Arm)   Pulse 71   Temp 98.1 F (36.7 C)   Resp 18   SpO2 96%   Visual Acuity Right Eye Distance:   Left Eye Distance:   Bilateral Distance:    Right Eye Near:   Left Eye Near:    Bilateral Near:     Physical Exam Vitals and nursing note reviewed.  Constitutional:      General: She is not in acute distress.    Appearance: Normal appearance.  HENT:     Head: Normocephalic.  Eyes:     Extraocular Movements: Extraocular movements intact.     Pupils: Pupils are equal, round, and reactive to light.  Cardiovascular:     Rate and Rhythm: Normal rate and regular rhythm.     Pulses: Normal pulses.     Heart sounds: Normal heart sounds.  Pulmonary:     Effort: Pulmonary effort is normal. No respiratory distress.     Breath sounds: Normal breath sounds. No stridor. No wheezing, rhonchi or rales.  Abdominal:     General: Bowel sounds are normal.     Palpations: Abdomen is soft.     Tenderness: There is no abdominal tenderness. There is no right CVA tenderness or left CVA tenderness.  Musculoskeletal:     Cervical back: Normal range of motion.  Skin:    General: Skin is warm and dry.  Neurological:     General: No focal deficit present.     Mental Status: She is alert and oriented to person, place, and time.  Psychiatric:        Mood and Affect: Mood normal.        Behavior: Behavior normal.      UC Treatments / Results  Labs (all labs ordered are listed, but only abnormal results are displayed) Labs Reviewed  POCT URINE DIPSTICK - Abnormal; Notable for the following  components:      Result Value   Clarity, UA cloudy (*)    Glucose, UA >=1,000 (*)    Blood, UA trace-intact (*)    Leukocytes, UA Trace (*)    All other components within normal limits  URINE CULTURE    EKG   Radiology No results found.  Procedures Procedures (including critical care time)  Medications Ordered in UC Medications - No data to display  Initial Impression / Assessment and Plan / UC Course  I have reviewed the triage vital signs and the nursing notes.  Pertinent labs & imaging results that were available during my care of the patient were reviewed by me and considered in my medical decision making (see chart for details).  Urinalysis with trace leukocytes and trace blood, urine culture is pending.  Will treat patient's current symptoms with Pyridium as she is having discomfort at the end of her urine stream.  Patient is not exhibiting flank pain or low back pain to suggest a kidney stone at this time.  Supportive care recommendations were provided and discussed with the patient to include over-the-counter analgesics, fluids, developing a toileting schedule, voiding after sexual intercourse, and avoiding caffeine.  Patient was given indications regarding follow-up.  Patient was advised of the urine culture result is negative and she is continue to experience symptoms, recommend follow-up with her primary care physician for further evaluation.  Patient was in agreement with this plan of care and  verbalizes understanding.  All questions were answered.  Patient stable for discharge.  Final Clinical Impressions(s) / UC Diagnoses   Final diagnoses:  Hematuria, unspecified type   Discharge Instructions   None    ED Prescriptions   None    PDMP not reviewed this encounter.   Gilmer Etta PARAS, NP 06/23/24 1535

## 2024-06-23 NOTE — Discharge Instructions (Signed)
 Urine culture is pending.  You will be contacted when the results of the urine culture are received.  You will also have access to the results via MyChart. Take medication as prescribed. You may take over-the-counter Tylenol  as needed for pain, fever, or general discomfort. Develop a toileting schedule that will allow you to urinate at least every 2 hours. Avoid caffeine such as tea, soda, or coffee while symptoms persist. Make sure you are voiding at least 15 to 20 minutes after each sexual encounter. If the results of the urine culture are negative and you are continue to experience symptoms, recommend follow-up with your primary care physician for further evaluation. Follow-up as needed.

## 2024-06-25 LAB — URINE CULTURE

## 2024-06-26 ENCOUNTER — Ambulatory Visit (HOSPITAL_COMMUNITY): Payer: Self-pay

## 2024-07-17 LAB — COMPREHENSIVE METABOLIC PANEL WITH GFR
ALT: 21 IU/L (ref 0–32)
AST: 25 IU/L (ref 0–40)
Albumin: 4.5 g/dL (ref 3.8–4.9)
Alkaline Phosphatase: 202 IU/L — ABNORMAL HIGH (ref 49–135)
BUN/Creatinine Ratio: 14 (ref 9–23)
BUN: 16 mg/dL (ref 6–24)
Bilirubin Total: 0.8 mg/dL (ref 0.0–1.2)
CO2: 23 mmol/L (ref 20–29)
Calcium: 10.1 mg/dL (ref 8.7–10.2)
Chloride: 92 mmol/L — ABNORMAL LOW (ref 96–106)
Creatinine, Ser: 1.13 mg/dL — ABNORMAL HIGH (ref 0.57–1.00)
Globulin, Total: 2.7 g/dL (ref 1.5–4.5)
Glucose: 407 mg/dL — ABNORMAL HIGH (ref 70–99)
Potassium: 3.9 mmol/L (ref 3.5–5.2)
Sodium: 133 mmol/L — ABNORMAL LOW (ref 134–144)
Total Protein: 7.2 g/dL (ref 6.0–8.5)
eGFR: 57 mL/min/1.73 — ABNORMAL LOW

## 2024-07-17 LAB — FIB-4 W/REFLEX TO ELF
FIB-4 Index: 1.2 (ref 0.00–2.67)
Platelets: 251 x10E3/uL (ref 150–450)

## 2024-07-17 LAB — LIPID PANEL
Chol/HDL Ratio: 2.5 ratio (ref 0.0–4.4)
Cholesterol, Total: 158 mg/dL (ref 100–199)
HDL: 63 mg/dL
LDL Chol Calc (NIH): 73 mg/dL (ref 0–99)
Triglycerides: 124 mg/dL (ref 0–149)
VLDL Cholesterol Cal: 22 mg/dL (ref 5–40)

## 2024-07-28 ENCOUNTER — Encounter: Payer: Self-pay | Admitting: "Endocrinology

## 2024-07-28 ENCOUNTER — Ambulatory Visit: Admitting: "Endocrinology

## 2024-07-28 VITALS — BP 96/58 | HR 84 | Ht 61.0 in | Wt 187.4 lb

## 2024-07-28 DIAGNOSIS — E782 Mixed hyperlipidemia: Secondary | ICD-10-CM | POA: Diagnosis not present

## 2024-07-28 DIAGNOSIS — Z91199 Patient's noncompliance with other medical treatment and regimen due to unspecified reason: Secondary | ICD-10-CM | POA: Diagnosis not present

## 2024-07-28 DIAGNOSIS — E1165 Type 2 diabetes mellitus with hyperglycemia: Secondary | ICD-10-CM

## 2024-07-28 DIAGNOSIS — Z794 Long term (current) use of insulin: Secondary | ICD-10-CM

## 2024-07-28 DIAGNOSIS — I10 Essential (primary) hypertension: Secondary | ICD-10-CM | POA: Diagnosis not present

## 2024-07-28 LAB — POCT GLYCOSYLATED HEMOGLOBIN (HGB A1C): HbA1c, POC (controlled diabetic range): 15 % — AB (ref 0.0–7.0)

## 2024-07-28 MED ORDER — INSULIN LISPRO PROT & LISPRO (75-25 MIX) 100 UNIT/ML KWIKPEN: PEN_INJECTOR | SUBCUTANEOUS | 1 refills | Status: DC

## 2024-07-28 MED ORDER — INSULIN LISPRO PROT & LISPRO (75-25 MIX) 100 UNIT/ML KWIKPEN: PEN_INJECTOR | SUBCUTANEOUS | 1 refills | Status: AC

## 2024-07-28 MED ORDER — TIRZEPATIDE 5 MG/0.5ML ~~LOC~~ SOAJ: 5.0000 mg | SUBCUTANEOUS | 0 refills | Status: DC

## 2024-07-28 MED ORDER — FREESTYLE LIBRE 2 PLUS SENSOR MISC: 3 refills | Status: DC

## 2024-07-28 MED ORDER — FREESTYLE LIBRE 2 READER DEVI: 0 refills | Status: DC

## 2024-07-28 NOTE — Patient Instructions (Signed)
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
## 2024-07-29 ENCOUNTER — Ambulatory Visit: Admitting: Adult Health

## 2024-07-29 ENCOUNTER — Encounter: Payer: Self-pay | Admitting: Adult Health

## 2024-07-29 VITALS — BP 112/74 | HR 77 | Ht 61.5 in | Wt 189.0 lb

## 2024-07-29 DIAGNOSIS — N930 Postcoital and contact bleeding: Secondary | ICD-10-CM | POA: Diagnosis not present

## 2024-07-29 DIAGNOSIS — R300 Dysuria: Secondary | ICD-10-CM | POA: Diagnosis not present

## 2024-07-29 DIAGNOSIS — N950 Postmenopausal bleeding: Secondary | ICD-10-CM

## 2024-07-29 DIAGNOSIS — E1165 Type 2 diabetes mellitus with hyperglycemia: Secondary | ICD-10-CM

## 2024-07-29 DIAGNOSIS — Z794 Long term (current) use of insulin: Secondary | ICD-10-CM

## 2024-07-29 DIAGNOSIS — N941 Unspecified dyspareunia: Secondary | ICD-10-CM | POA: Diagnosis not present

## 2024-07-29 LAB — POCT URINALYSIS DIPSTICK
Blood, UA: NEGATIVE
Glucose, UA: POSITIVE — AB
Ketones, UA: NEGATIVE
Leukocytes, UA: NEGATIVE
Nitrite, UA: NEGATIVE
Protein, UA: NEGATIVE

## 2024-07-29 MED ORDER — FREESTYLE LIBRE 3 READER DEVI: 0 refills | Status: AC

## 2024-07-29 MED ORDER — FREESTYLE LIBRE 3 PLUS SENSOR MISC: 3 refills | Status: AC

## 2024-07-29 NOTE — Progress Notes (Signed)
" °  Subjective:     Patient ID: Pincus [name], female   DOB: 1971-03-23, 54 y.o.   MRN: [id number]  HPI Feleshia is a 54 year old black female, married, PM in complaining of burning with urination and has seen blood when she wipes, and has had bleeding after sex, and sex hurts at times. She is on eliquis  for PE, after fishing trip for 5 hours.  She has lost about 85 lbs on mounjaro       Component Value Date/Time   DIAGPAP  10/11/2023 0922    - Negative for intraepithelial lesion or malignancy (NILM)   HPVHIGH Negative 10/11/2023 0922   ADEQPAP  10/11/2023 0922    Satisfactory for evaluation; transformation zone component PRESENT.    PCP is L Strader NP Review of Systems +burning with urination  +has seen blood when she wipes,  +had bleeding after sex, and sex hurts at times. Reviewed past medical,surgical, social and family history. Reviewed medications and allergies.     Objective:   Physical Exam BP 112/74 (BP Location: Right Arm, Patient Position: Sitting, Cuff Size: Normal)   Pulse 77   Ht 5' 1.5 (1.562 m)   Wt 189 lb (85.7 kg)   BMI 35.13 kg/m  urine dipstick 2+glucose Skin warm and dry.Pelvic: external genitalia is normal in appearance no lesions, vagina: white discharge without odor,urethra has no lesions or masses noted, cervix:smooth, uterus: normal size, shape and contour, non tender, no masses felt, adnexa: no masses, LLQ tenderness noted. Bladder is non tender and no masses felt.   Upstream - 07/29/24 1505       Pregnancy Intention Screening   Does the patient want to become pregnant in the next year? N/A    Does the patient's partner want to become pregnant in the next year? N/A    Would the patient like to discuss contraceptive options today? N/A      Contraception Wrap Up   Current Method Post-Menopause    End Method Post-Menopause    Contraception Counseling Provided No            Examination chaperoned by Clarita Salt LPN  Assessment:     1. Burning  with urination (Primary) UA C&S sent to rule out UTI - POCT Urinalysis Dipstick - Urine Culture - Urinalysis, Routine w reflex microscopic  2. Dyspareunia, female - Urine Culture - Urinalysis, Routine w reflex microscopic  3. Postcoital bleeding  4. PMB (postmenopausal bleeding) +wiped blood Will get US  in office to assess uterus and ovaries, she is aware of endometrial lining thickened  will need biopsy  - US  PELVIC COMPLETE WITH TRANSVAGINAL; Future     Plan:     Return in about 2 weeks for pelvic US      "

## 2024-07-31 LAB — URINALYSIS, ROUTINE W REFLEX MICROSCOPIC
Bilirubin, UA: NEGATIVE
Ketones, UA: NEGATIVE
Leukocytes,UA: NEGATIVE
Nitrite, UA: NEGATIVE
Protein,UA: NEGATIVE
RBC, UA: NEGATIVE
Specific Gravity, UA: 1.021 (ref 1.005–1.030)
Urobilinogen, Ur: 0.2 mg/dL (ref 0.2–1.0)
pH, UA: 5.5 (ref 5.0–7.5)

## 2024-08-02 LAB — URINE CULTURE

## 2024-08-04 ENCOUNTER — Ambulatory Visit: Payer: Self-pay | Admitting: Adult Health

## 2024-08-11 ENCOUNTER — Other Ambulatory Visit (HOSPITAL_COMMUNITY): Payer: Self-pay

## 2024-08-11 ENCOUNTER — Telehealth: Payer: Self-pay | Admitting: Pharmacy Technician

## 2024-08-11 NOTE — Telephone Encounter (Signed)
 Pharmacy Patient Advocate Encounter   Received notification from [facility] KEY that prior authorization for FreeStyle Anna 3 Reader device  is required/requested.   Insurance verification completed.   The patient is insured through [facility].   Per test claim: PA required; PA submitted to above mentioned insurance via Latent Key/confirmation #/EOC [facility] Status is pending

## 2024-08-11 NOTE — Telephone Encounter (Signed)
 Pharmacy Patient Advocate Encounter  Received notification from [employer] that Prior Authorization for FreeStyle Libre 3 Reader device  has been APPROVED from 08/11/24 to 08/11/25. Ran test claim, Copay is $0.00. This test claim was processed through [facility]- copay amounts may vary at other pharmacies due to pharmacy/plan contracts, or as the patient moves through the different stages of their insurance plan.   PA #/Case ID/Reference #: [id number]

## 2024-08-12 ENCOUNTER — Ambulatory Visit: Admitting: Radiology

## 2024-08-12 DIAGNOSIS — N950 Postmenopausal bleeding: Secondary | ICD-10-CM

## 2024-08-12 NOTE — Progress Notes (Signed)
 GYN US : TA and TV imaging performed - vinyl probe cover used - Chaperone: Amanda Anteverted uterus normal in size, symmetrical, homogeneous myometrium, no focal abn seen Endom thickness =  1.65mm, thin, uniform avascular cavity and canal, no evidence of soft tissue intracavitary defects Ovaries appear normal in size,  The Left ovary appears mobile, blood flow not sensitive enough to display perfusion to menopausal ovaries, The Right ovary seen with tiny simple calcification = 5 mm.  There is a single tiny echofree cyst = 6 mm mid Rt ovary.  The Right ovary does not appear mobile. neg adnexal regions, neg CDS, no free fluid present

## 2024-08-13 ENCOUNTER — Ambulatory Visit: Admitting: "Endocrinology

## 2024-08-13 ENCOUNTER — Encounter: Payer: Self-pay | Admitting: "Endocrinology

## 2024-08-13 VITALS — BP 120/58 | HR 76 | Ht 61.5 in | Wt 197.8 lb

## 2024-08-13 DIAGNOSIS — I10 Essential (primary) hypertension: Secondary | ICD-10-CM

## 2024-08-13 DIAGNOSIS — Z794 Long term (current) use of insulin: Secondary | ICD-10-CM

## 2024-08-13 DIAGNOSIS — Z91199 Patient's noncompliance with other medical treatment and regimen due to unspecified reason: Secondary | ICD-10-CM | POA: Diagnosis not present

## 2024-08-13 DIAGNOSIS — E1165 Type 2 diabetes mellitus with hyperglycemia: Secondary | ICD-10-CM | POA: Diagnosis not present

## 2024-08-13 DIAGNOSIS — E782 Mixed hyperlipidemia: Secondary | ICD-10-CM

## 2024-08-13 MED ORDER — TIRZEPATIDE 7.5 MG/0.5ML ~~LOC~~ SOAJ: 7.5000 mg | SUBCUTANEOUS | 1 refills | Status: AC

## 2024-08-13 MED ORDER — ONDANSETRON 4 MG PO TBDP: ORAL_TABLET | ORAL | 0 refills | Status: AC

## 2024-08-13 NOTE — Progress Notes (Signed)
 "                                08/13/2024, 3:01 PM                                                     Endocrinology follow-up note   Subjective:    Patient ID: Lindsey Vazquez, female    DOB: [birth year].  Lindsey Vazquez is being seen in follow-up for management of currently uncontrolled symptomatic type 2 diabetes, hyperlipidemia, vitamin D  deficiency.  PCP:   Johnson Morna FALCON, NP.   Past Medical History:  Diagnosis Date   Diabetes mellitus without complication (HCC)    Hyperlipemia    Hypertension    PE (pulmonary thromboembolism) (HCC)    Past Surgical History:  Procedure Laterality Date   c-section     CARDIAC CATHETERIZATION  07/2021   LAPAROSCOPIC GASTRIC SLEEVE RESECTION  08/11/2021   Social History   Socioeconomic History   Marital status: Married    Spouse name: Not on file   Number of children: Not on file   Years of education: Not on file   Highest education level: Not on file  Occupational History   Not on file  Tobacco Use   Smoking status: Former    Current packs/day: 0.00    Types: Cigarettes    Quit date: 07/25/2015    Years since quitting: 9.0   Smokeless tobacco: Never  Vaping Use   Vaping status: Former  Substance and Sexual Activity   Alcohol use: No    Comment: socially   Drug use: No   Sexual activity: Yes    Birth control/protection: Post-menopausal  Other Topics Concern   Not on file  Social History Narrative   Not on file   Social Drivers of Health   Tobacco Use: Medium Risk (08/13/2024)   Patient History    Smoking Tobacco Use: Former    Smokeless Tobacco Use: Never    Passive Exposure: Not on Actuary Strain: Low Risk (10/11/2023)   Overall Financial Resource Strain (CARDIA)    Difficulty of Paying Living Expenses: Not very hard  Food Insecurity: No Food Insecurity (10/11/2023)   Hunger Vital Sign    Worried About Running Out of Food in the Last Year: Never true    Ran Out of Food in the Last  Year: Never true  Transportation Needs: No Transportation Needs (10/11/2023)   PRAPARE - Administrator, Civil Service (Medical): No    Lack of Transportation (Non-Medical): No  Physical Activity: Sufficiently Active (10/11/2023)   Exercise Vital Sign    Days of Exercise per Week: 5 days    Minutes of Exercise per Session: 30 min  Stress: No Stress Concern Present (10/11/2023)   [employer] of Occupational Health - Occupational Stress Questionnaire    Feeling of Stress : Not at all  Social Connections: Socially Integrated (10/11/2023)   Social Connection and Isolation Panel    Frequency of Communication with Friends and Family: Three times a week    Frequency of Social Gatherings with Friends and Family: Once a week    Attends Religious Services: More than 4 times per year    Active Member of Clubs or Organizations: Yes  Attends Banker Meetings: More than 4 times per year    Marital Status: Married  Depression (PHQ2-9): Low Risk (10/11/2023)   Depression (PHQ2-9)    PHQ-2 Score: 2  Alcohol Screen: Low Risk (10/11/2023)   Alcohol Screen    Last Alcohol Screening Score (AUDIT): 0  Housing: Unknown (11/06/2023)   Received from [facility]   Epic    At any time in the past 12 months, were you homeless or living in a shelter (including now)?: No    Number of Times Moved in the Last Year: Not on file    Unable to Pay for Housing in the Last Year: Not on file  Utilities: Not At Risk (11/24/2022)   [facility] Utilities    Threatened with loss of utilities: No  Health Literacy: Not on file   Outpatient Encounter Medications as of 08/13/2024  Medication Sig   tirzepatide  (MOUNJARO ) 7.5 MG/0.5ML Pen Inject 7.5 mg into the skin once a week.   acetaminophen  (TYLENOL ) 500 MG tablet Take 1 tablet (500 mg total) by mouth every 6 (six) hours as needed for mild pain, fever or headache.   apixaban  (ELIQUIS ) 5 MG TABS tablet Take 1 tablet (5 mg total) by mouth 2  (two) times daily. To start after you finish the starter pack   atorvastatin  (LIPITOR) 80 MG tablet Take 80 mg by mouth daily.   Cholecalciferol 50 MCG (2000 UT) TABS Take 1 tablet by mouth daily.   Continuous Glucose Receiver (FREESTYLE LIBRE 3 READER) DEVI Use to monitor glucose continuously as directed by Provider.   Continuous Glucose Sensor (FREESTYLE LIBRE 3 PLUS SENSOR) MISC Change sensor every 15 days to monitor blood glucose continuous as directed by Provider.   Insulin  Lispro Prot & Lispro (HUMALOG  MIX 75/25 KWIKPEN) (75-25) 100 UNIT/ML Kwikpen Inject 80 units each morning with breakfast and 80 units at supper on time and only when glucose is above 90   magnesium  oxide (MAG-OX) 400 (240 Mg) MG tablet Take 1 tablet by mouth daily.   metoprolol  succinate (TOPROL -XL) 50 MG 24 hr tablet Take 50 mg by mouth daily.   Multiple Vitamin (MULTIVITAMIN) tablet Take 1 tablet by mouth daily.   ondansetron  (ZOFRAN -ODT) 4 MG disintegrating tablet Only if Mounjaro  causes nausea /vomiting x 1 tablet weekly.   potassium chloride  SA (KLOR-CON  M) 20 MEQ tablet Take 10 mEq by mouth every morning.   telmisartan (MICARDIS) 20 MG tablet Take 20 mg by mouth every other day.   torsemide (DEMADEX) 20 MG tablet Take 20 mg by mouth daily.   [DISCONTINUED] ondansetron  (ZOFRAN -ODT) 4 MG disintegrating tablet 4mg  ODT q4 hours prn nausea/vomit   [DISCONTINUED] tirzepatide  (MOUNJARO ) 5 MG/0.5ML Pen Inject 5 mg into the skin once a week.   No facility-administered encounter medications on file as of 08/13/2024.    ALLERGIES: Allergies  Allergen Reactions   Jardiance [Empagliflozin] Other (See Comments)    Put her in DKA per pt   Metformin  And Related Nausea And Vomiting    VACCINATION STATUS:  There is no immunization history on file for this patient.  Diabetes She presents for her follow-up diabetic visit. She has type 2 diabetes mellitus. Onset time: Diagnosed at approximate age of 54 years, after episode of  gestational diabetes. Her disease course has been improving. There are no hypoglycemic associated symptoms. Pertinent negatives for hypoglycemia include no confusion, headaches, pallor or seizures. Associated symptoms include foot paresthesias, polydipsia and polyuria. Pertinent negatives for diabetes include no chest pain  and no fatigue. There are no hypoglycemic complications. Symptoms are improving (Since her last visit, she was hospitalized for nausea/vomiting and was diagnosed with diabetes ketoacidosis.). Diabetic complications include nephropathy. Risk factors for coronary artery disease include diabetes mellitus, dyslipidemia, hypertension, family history, obesity, sedentary lifestyle and tobacco exposure. Current diabetic treatment includes insulin  injections. Her weight is increasing steadily. She is following a generally unhealthy diet. When asked about meal planning, she reported none. She rarely participates in exercise. Her home blood glucose trend is decreasing steadily. Her breakfast blood glucose range is generally >200 mg/dl. Her lunch blood glucose range is generally >200 mg/dl. Her dinner blood glucose range is generally >200 mg/dl. Her bedtime blood glucose range is generally >200 mg/dl. Her overall blood glucose range is >200 mg/dl. (she presents with significant improvement in her glycemic profile averaging 210 mg per DL for the most recent 2 weeks.  Her AGP report shows 48% time in range, 19% level 1 hyperglycemia, 32% level 2 hyperglycemia.  She has no hypoglycemia.  Her recent A1c was > 15%.) An ACE inhibitor/angiotensin II receptor blocker is not being taken.  Hyperlipidemia This is a chronic problem. The current episode started more than 1 year ago. The problem is uncontrolled. Exacerbating diseases include diabetes and obesity. Pertinent negatives include no chest pain, myalgias or shortness of breath. Current antihyperlipidemic treatment includes statins. Risk factors for coronary  artery disease include dyslipidemia, diabetes mellitus, hypertension, family history, obesity and a sedentary lifestyle.  Hypertension This is a chronic problem. The current episode started more than 1 year ago. Pertinent negatives include no chest pain, headaches, palpitations or shortness of breath. Risk factors for coronary artery disease include diabetes mellitus, dyslipidemia, sedentary lifestyle, smoking/tobacco exposure and obesity.    Review of systems  Constitutional: + Minimally fluctuating body weight,  current  Body mass index is 36.77 kg/m. , no fatigue, no subjective hyperthermia, no subjective hypothermia   Objective:    BP (!) 120/58   Pulse 76   Ht 5' 1.5 (1.562 m)   Wt 197 lb 12.8 oz (89.7 kg)   BMI 36.77 kg/m   Wt Readings from Last 3 Encounters:  08/13/24 197 lb 12.8 oz (89.7 kg)  07/29/24 189 lb (85.7 kg)  07/28/24 187 lb 6.4 oz (85 kg)      Physical Exam- Limited  Constitutional:  Body mass index is 36.77 kg/m. , not in acute distress, normal state of mind Abdomen: Skin exam is not convincing for the amount of insulin  she is given to inject.   CMP     Component Value Date/Time   NA 133 (L) 07/16/2024 0807   K 3.9 07/16/2024 0807   CL 92 (L) 07/16/2024 0807   CO2 23 07/16/2024 0807   GLUCOSE 407 (H) 07/16/2024 0807   GLUCOSE 394 (H) 08/02/2023 1117   BUN 16 07/16/2024 0807   CREATININE 1.13 (H) 07/16/2024 0807   CREATININE 0.55 12/29/2019 1004   CALCIUM  10.1 07/16/2024 0807   PROT 7.2 07/16/2024 0807   ALBUMIN 4.5 07/16/2024 0807   AST 25 07/16/2024 0807   ALT 21 07/16/2024 0807   ALKPHOS 202 (H) 07/16/2024 0807   BILITOT 0.8 07/16/2024 0807   GFRNONAA >60 08/02/2023 1117   GFRNONAA 109 12/29/2019 1004   GFRAA 127 12/29/2019 1004   Lipid Panel     Component Value Date/Time   CHOL 158 07/16/2024 0807   TRIG 124 07/16/2024 0807   HDL 63 07/16/2024 0807   CHOLHDL 2.5 07/16/2024 9192  CHOLHDL 3.3 12/29/2019 1004   LDLCALC 73  07/16/2024 0807   LDLCALC 99 12/29/2019 1004   LABVLDL 22 07/16/2024 0807     Assessment & Plan:   1. Uncontrolled type 2 diabetes mellitus with hyperglycemia, CKD   - Lindsey Vazquez has currently uncontrolled symptomatic type 2 DM since 54 years of age.  she presents with significant improvement in her glycemic profile averaging 210 mg per DL for the most recent 2 weeks.  Her AGP report shows 48% time in range, 19% level 1 hyperglycemia, 32% level 2 hyperglycemia.  She has no hypoglycemia.  Her recent A1c was > 15%.  -She did not have  hypoglycemic episodes since last visit.   Recent labs reviewed, which also shows significant vitamin D  deficiency.  -her diabetes is complicated by DKA, obesity, and sedentary life and she remains at a high risk for more acute and chronic complications which include CAD, CVA, CKD, retinopathy, and neuropathy. These are all discussed in detail with her.  - I have counseled her on diet management and weight loss, by adopting a carbohydrate restricted/protein rich diet. This patient has not engaged in lifestyle medicine.  She is in fact the best candidate for lifestyle medicine in light of the fact that she is losing control of diabetes despite recent bariatric surgery.   - she acknowledges that there is a room for improvement in her food and drink choices. - Suggestion is made for her to avoid simple carbohydrates  from her diet including Cakes, Sweet Desserts, Ice Cream, Soda (diet and regular), Sweet Tea, Candies, Chips, Cookies, Store Bought Juices, Alcohol , Artificial Sweeteners,  Coffee Creamer, and Sugar-free Products, Lemonade. This will help patient to have more stable blood glucose profile and potentially avoid unintended weight gain.  - she is advised to stick to a routine mealtimes to eat 3 meals  a day and avoid unnecessary snacks ( to snack only to correct hypoglycemia).   - I have approached her with the following individualized plan to  manage diabetes and patient agrees:    - Patient has reasonable engagement and responding to treatment.  She will still need multiple daily injections of insulin  in order for her to achieve control of diabetes to target.  She is at exceedingly high risk for hypo and hyperglycemic crisis.  She he is advised to continue her Humalog  75/25 80 units with breakfast and 80 units with supper  for Premeal blood glucose readings above 90 mg per DL.    -She is advised to  using her CGM continuously and call clinic for weekly average blood glucose more than 200 mg per DL or hypoglycemia under 70 mg per DL. -She may benefit from a higher dose of Mounjaro .  I discussed in increase her Mounjaro  to 7.5 mg subcutaneously weekly.   -Advised to stay away from SGLT2 inhibitors given her recent history of euglycemic DKA while taking Jardiance.  -I have refilled her Zofran  to use only following her GLP-1 receptor agonist injections which she says gives her nausea/vomiting. - Patient specific target  A1c;  LDL, HDL, Triglycerides were discussed in detail.  2) BP/HTN:  Her blood pressure is controlled to target.    she is currently not taking any blood pressure medications.    3) Lipids/HPL: Her recent lipid panel showed controlled LDL at 73. She is advised to continue atorvastatin  80 mg p.o. nightly.     Based on her response, she will not need Repatha for now.  Side effects  and precautions discussed with her.  4) vitamin  D deficiency- 25 hydroxy vitamin D  remains low despite a course of treatment with vitamin D3 5000 units for 90 days.  She is advised to continue vitamin D3 5000 units daily.   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  She is currently losing weight likely due to Mounjaro .  6) Chronic Care/Health Maintenance:  -she  is on Statin medications and  is encouraged to initiate and continue to follow up  with Ophthalmology, Dentist, nephrology, podiatrist at least yearly or according to recommendations, and advised to   stay away from smoking. I have recommended yearly flu vaccine and pneumonia vaccine at least every 5 years; moderate intensity exercise for up to 150 minutes weekly; and  sleep for at least 7 hours a day.  - I advised patient to maintain close follow up with Strader, Lindsey F, NP for primary care needs.  I spent  25  minutes in the care of the patient today including review of labs from CMP, Lipids, Thyroid Function, Hematology (current and previous including abstractions from other facilities); face-to-face time discussing  her blood glucose readings/logs, discussing hypoglycemia and hyperglycemia episodes and symptoms, medications doses, her options of short and long term treatment based on the latest standards of care / guidelines;  discussion about incorporating lifestyle medicine;  and documenting the encounter. Risk reduction counseling performed per USPSTF guidelines to reduce  obesity and cardiovascular risk factors.     Please refer to Patient Instructions for Blood Glucose Monitoring and Insulin /Medications Dosing Guide  in media tab for additional information. Please  also refer to  Patient Self Inventory in the Media  tab for reviewed elements of pertinent patient history.  Lindsey Vazquez participated in the discussions, expressed understanding, and voiced agreement with the above plans.  All questions were answered to her satisfaction. she is encouraged to contact clinic should she have any questions or concerns prior to her return visit. Dear Patient: Feel free to review your progress notes.  If you are reviewing this progress note and have questions about the meaning of /or medical terms being used, please make a note and address it at your next follow-up appointment.  Medical notes are meant to be a communication tool between medical professionals and require medical  terms to be used for efficiency and insurance approval.   Follow up plan: - Return in about 3 months (around 11/11/2024) for Bring Meter/CGM Device/Logs- A1c in Office.  Ranny Earl, MD [facility] [address] [place], [place] [address] Phone: [phone]  Fax: [phone]    08/13/2024, 3:01 PM  This note was partially dictated with voice recognition software. Similar sounding words can be transcribed inadequately or may not  be corrected upon review.  "

## 2024-08-13 NOTE — Patient Instructions (Signed)

## 2024-11-11 ENCOUNTER — Ambulatory Visit: Admitting: "Endocrinology
